# Patient Record
Sex: Male | Born: 1960 | Race: White | Hispanic: No | Marital: Married | Smoking: Never smoker
Health system: Southern US, Community
[De-identification: ages and names within clinical notes are randomized; demographics above are authoritative.]

## PROBLEM LIST (undated history)

## (undated) DIAGNOSIS — J45909 Unspecified asthma, uncomplicated: Secondary | ICD-10-CM

---

## 2013-12-06 ENCOUNTER — Emergency Department (HOSPITAL_COMMUNITY): Payer: Worker's Compensation

## 2013-12-06 ENCOUNTER — Encounter (HOSPITAL_COMMUNITY): Payer: Self-pay | Admitting: Emergency Medicine

## 2013-12-06 ENCOUNTER — Other Ambulatory Visit: Payer: Self-pay

## 2013-12-06 ENCOUNTER — Inpatient Hospital Stay (HOSPITAL_COMMUNITY)
Admission: EM | Admit: 2013-12-06 | Discharge: 2013-12-09 | DRG: 086 | Disposition: A | Discharge status: Rehabilitation Facility | Payer: Worker's Compensation | Attending: Plastic Surgery | Admitting: Plastic Surgery

## 2013-12-06 DIAGNOSIS — S32048A Other fracture of fourth lumbar vertebra, initial encounter for closed fracture: Secondary | ICD-10-CM | POA: Diagnosis present

## 2013-12-06 DIAGNOSIS — S32008A Other fracture of unspecified lumbar vertebra, initial encounter for closed fracture: Secondary | ICD-10-CM | POA: Diagnosis present

## 2013-12-06 DIAGNOSIS — S01311A Laceration without foreign body of right ear, initial encounter: Secondary | ICD-10-CM | POA: Diagnosis present

## 2013-12-06 DIAGNOSIS — H532 Diplopia: Secondary | ICD-10-CM | POA: Diagnosis present

## 2013-12-06 DIAGNOSIS — S32028A Other fracture of second lumbar vertebra, initial encounter for closed fracture: Secondary | ICD-10-CM | POA: Diagnosis present

## 2013-12-06 DIAGNOSIS — S060X9A Concussion with loss of consciousness of unspecified duration, initial encounter: Secondary | ICD-10-CM | POA: Diagnosis present

## 2013-12-06 DIAGNOSIS — S01112A Laceration without foreign body of left eyelid and periocular area, initial encounter: Secondary | ICD-10-CM | POA: Diagnosis present

## 2013-12-06 DIAGNOSIS — S060XAA Concussion with loss of consciousness status unknown, initial encounter: Secondary | ICD-10-CM | POA: Diagnosis present

## 2013-12-06 DIAGNOSIS — R29810 Facial weakness: Secondary | ICD-10-CM | POA: Diagnosis present

## 2013-12-06 DIAGNOSIS — S32018A Other fracture of first lumbar vertebra, initial encounter for closed fracture: Secondary | ICD-10-CM | POA: Diagnosis present

## 2013-12-06 DIAGNOSIS — S32009A Unspecified fracture of unspecified lumbar vertebra, initial encounter for closed fracture: Secondary | ICD-10-CM | POA: Diagnosis present

## 2013-12-06 DIAGNOSIS — S32038A Other fracture of third lumbar vertebra, initial encounter for closed fracture: Secondary | ICD-10-CM | POA: Diagnosis present

## 2013-12-06 DIAGNOSIS — S0219XA Other fracture of base of skull, initial encounter for closed fracture: Secondary | ICD-10-CM | POA: Diagnosis present

## 2013-12-06 DIAGNOSIS — G510 Bell's palsy: Secondary | ICD-10-CM | POA: Diagnosis present

## 2013-12-06 DIAGNOSIS — T1490XA Injury, unspecified, initial encounter: Secondary | ICD-10-CM

## 2013-12-06 DIAGNOSIS — Y9269 Other specified industrial and construction area as the place of occurrence of the external cause: Secondary | ICD-10-CM | POA: Diagnosis not present

## 2013-12-06 DIAGNOSIS — G9389 Other specified disorders of brain: Secondary | ICD-10-CM

## 2013-12-06 DIAGNOSIS — S02109A Fracture of base of skull, unspecified side, initial encounter for closed fracture: Secondary | ICD-10-CM | POA: Diagnosis present

## 2013-12-06 DIAGNOSIS — H9191 Unspecified hearing loss, right ear: Secondary | ICD-10-CM | POA: Diagnosis present

## 2013-12-06 DIAGNOSIS — H7420 Discontinuity and dislocation of ear ossicles, unspecified ear: Secondary | ICD-10-CM | POA: Diagnosis present

## 2013-12-06 DIAGNOSIS — G529 Cranial nerve disorder, unspecified: Secondary | ICD-10-CM | POA: Diagnosis present

## 2013-12-06 DIAGNOSIS — S0181XA Laceration without foreign body of other part of head, initial encounter: Secondary | ICD-10-CM | POA: Diagnosis present

## 2013-12-06 HISTORY — DX: Unspecified asthma, uncomplicated: J45.909

## 2013-12-06 LAB — COMPREHENSIVE METABOLIC PANEL
ALT: 28 U/L (ref 0–53)
AST: 28 U/L (ref 0–37)
Albumin: 4 g/dL (ref 3.5–5.2)
Alkaline Phosphatase: 78 U/L (ref 39–117)
Anion gap: 17 — ABNORMAL HIGH (ref 5–15)
BILIRUBIN TOTAL: 0.3 mg/dL (ref 0.3–1.2)
BUN: 15 mg/dL (ref 6–23)
CALCIUM: 8.7 mg/dL (ref 8.4–10.5)
CHLORIDE: 100 meq/L (ref 96–112)
CO2: 24 meq/L (ref 19–32)
CREATININE: 0.96 mg/dL (ref 0.50–1.35)
GLUCOSE: 125 mg/dL — AB (ref 70–99)
Potassium: 3.3 mEq/L — ABNORMAL LOW (ref 3.7–5.3)
Sodium: 141 mEq/L (ref 137–147)
Total Protein: 7.1 g/dL (ref 6.0–8.3)

## 2013-12-06 LAB — CBC WITH DIFFERENTIAL/PLATELET
BASOS ABS: 0 10*3/uL (ref 0.0–0.1)
BASOS PCT: 0 % (ref 0–1)
EOS PCT: 4 % (ref 0–5)
Eosinophils Absolute: 0.6 10*3/uL (ref 0.0–0.7)
HCT: 42 % (ref 39.0–52.0)
Hemoglobin: 14 g/dL (ref 13.0–17.0)
Lymphocytes Relative: 19 % (ref 12–46)
Lymphs Abs: 3 10*3/uL (ref 0.7–4.0)
MCH: 31.1 pg (ref 26.0–34.0)
MCHC: 33.3 g/dL (ref 30.0–36.0)
MCV: 93.3 fL (ref 78.0–100.0)
MONOS PCT: 7 % (ref 3–12)
Monocytes Absolute: 1.2 10*3/uL — ABNORMAL HIGH (ref 0.1–1.0)
NEUTROS ABS: 10.9 10*3/uL — AB (ref 1.7–7.7)
Neutrophils Relative %: 70 % (ref 43–77)
Platelets: 236 10*3/uL (ref 150–400)
RBC: 4.5 MIL/uL (ref 4.22–5.81)
RDW: 12.7 % (ref 11.5–15.5)
WBC: 15.7 10*3/uL — ABNORMAL HIGH (ref 4.0–10.5)

## 2013-12-06 LAB — MRSA PCR SCREENING: MRSA by PCR: NEGATIVE

## 2013-12-06 LAB — CDS SEROLOGY

## 2013-12-06 LAB — TYPE AND SCREEN
ABO/RH(D): A POS
Antibody Screen: NEGATIVE

## 2013-12-06 LAB — PROTIME-INR
INR: 1.14 (ref 0.00–1.49)
Prothrombin Time: 14.8 seconds (ref 11.6–15.2)

## 2013-12-06 LAB — ABO/RH: ABO/RH(D): A POS

## 2013-12-06 LAB — ETHANOL: Alcohol, Ethyl (B): <11 mg/dL (ref 0–11)

## 2013-12-06 MED ORDER — MORPHINE SULFATE 2 MG/ML IJ SOLN
2.0000 mg | INTRAMUSCULAR | Status: DC | PRN
  Administered 2013-12-06 – 2013-12-07 (×3): 2 mg via INTRAVENOUS
  Filled 2013-12-06 (×6): qty 1

## 2013-12-06 MED ORDER — HYPROMELLOSE (GONIOSCOPIC) 2.5 % OP SOLN: 1.0000 [drp] | OPHTHALMIC | Status: DC

## 2013-12-06 MED ORDER — ONDANSETRON HCL 4 MG/2ML IJ SOLN
4.0000 mg | Freq: Four times a day (QID) | INTRAMUSCULAR | Status: DC | PRN
  Administered 2013-12-06 – 2013-12-08 (×5): 4 mg via INTRAVENOUS
  Filled 2013-12-06 (×6): qty 2

## 2013-12-06 MED ORDER — ONDANSETRON HCL 4 MG/2ML IJ SOLN
4.0000 mg | Freq: Once | INTRAMUSCULAR | Status: AC
  Administered 2013-12-06: 4 mg via INTRAVENOUS
  Filled 2013-12-06: qty 2

## 2013-12-06 MED ORDER — IOHEXOL 350 MG/ML SOLN
50.0000 mL | Freq: Once | INTRAVENOUS | Status: AC | PRN
  Administered 2013-12-06: 50 mL via INTRAVENOUS

## 2013-12-06 MED ORDER — POLYVINYL ALCOHOL 1.4 % OP SOLN
1.0000 [drp] | OPHTHALMIC | Status: DC | PRN
  Filled 2013-12-06: qty 15

## 2013-12-06 MED ORDER — ONDANSETRON HCL 4 MG PO TABS: 4.0000 mg | ORAL_TABLET | Freq: Four times a day (QID) | ORAL | Status: DC | PRN

## 2013-12-06 MED ORDER — PANTOPRAZOLE SODIUM 40 MG IV SOLR
40.0000 mg | Freq: Every day | INTRAVENOUS | Status: DC
  Administered 2013-12-06 – 2013-12-07 (×2): 40 mg via INTRAVENOUS
  Filled 2013-12-06 (×4): qty 40

## 2013-12-06 MED ORDER — SODIUM CHLORIDE 0.9 % IV BOLUS (SEPSIS)
1000.0000 mL | Freq: Once | INTRAVENOUS | Status: AC
  Administered 2013-12-06: 1000 mL via INTRAVENOUS

## 2013-12-06 MED ORDER — TETANUS-DIPHTH-ACELL PERTUSSIS 5-2.5-18.5 LF-MCG/0.5 IM SUSP
0.5000 mL | Freq: Once | INTRAMUSCULAR | Status: AC
  Administered 2013-12-06: 0.5 mL via INTRAMUSCULAR
  Filled 2013-12-06: qty 0.5

## 2013-12-06 MED ORDER — ARTIFICIAL TEARS OP OINT
1.0000 "application " | TOPICAL_OINTMENT | Freq: Every day | OPHTHALMIC | Status: DC
  Administered 2013-12-06 – 2013-12-08 (×3): 1 via OPHTHALMIC
  Filled 2013-12-06: qty 3.5

## 2013-12-06 MED ORDER — POTASSIUM CHLORIDE IN NACL 20-0.9 MEQ/L-% IV SOLN
INTRAVENOUS | Status: DC
  Administered 2013-12-06 – 2013-12-08 (×3): via INTRAVENOUS
  Filled 2013-12-06 (×6): qty 1000

## 2013-12-06 MED ORDER — PANTOPRAZOLE SODIUM 40 MG PO TBEC
40.0000 mg | DELAYED_RELEASE_TABLET | Freq: Every day | ORAL | Status: DC
  Administered 2013-12-08 – 2013-12-09 (×2): 40 mg via ORAL
  Filled 2013-12-06 (×2): qty 1

## 2013-12-06 MED ORDER — CETYLPYRIDINIUM CHLORIDE 0.05 % MT LIQD
7.0000 mL | Freq: Two times a day (BID) | OROMUCOSAL | Status: DC
  Administered 2013-12-06 – 2013-12-08 (×4): 7 mL via OROMUCOSAL

## 2013-12-06 MED ORDER — LIDOCAINE-EPINEPHRINE 2 %-1:100000 IJ SOLN
20.0000 mL | Freq: Once | INTRAMUSCULAR | Status: AC
  Administered 2013-12-06: 20 mL
  Filled 2013-12-06: qty 20

## 2013-12-06 MED ORDER — HYDROMORPHONE HCL 1 MG/ML IJ SOLN
1.0000 mg | Freq: Once | INTRAMUSCULAR | Status: AC
  Administered 2013-12-06: 1 mg via INTRAVENOUS
  Filled 2013-12-06: qty 1

## 2013-12-06 NOTE — Op Note (Signed)
Operative Note   DATE OF OPERATION: 12/06/2013  [facility]  SURGICAL DIVISION: Plastic Surgery  PREOPERATIVE DIAGNOSES:  Facial trauma with complex right ear laceration and left upper eye lid laceration.  POSTOPERATIVE DIAGNOSES:  same  PROCEDURE:  Repair of complex right ear laceration (8 cm) closed in layers and included cartilage and left upper eye lid laceration complex 3 cm.  SURGEON: Wayland Denislaire Sanger, DO  ANESTHESIA:  local  COMPLICATIONS: None.   INDICATIONS FOR PROCEDURE:  The patient, Michael Hooper is a 53 y.o. male born on [birth year], is here for treatment of multiple facial lacerations. MRN: [id number]  CONSENT:  Informed consent was obtained directly from the patient. Risks, benefits and alternatives were fully discussed. Specific risks including but not limited to bleeding, infection, hematoma, seroma, scarring, pain, infection, contracture, asymmetry, wound healing problems, and need for further surgery were all discussed. The patient did have an ample opportunity to have questions answered to satisfaction.   DESCRIPTION OF PROCEDURE:  The patient's operative site was prepped and draped in a sterile fashion. A time out was performed and all information was confirmed to be correct.  Local anesthesia was administered.  The right ear was cleaned with betadine and a periauricular block was performed for pain management.  The area was irrigated and no foreign bodies were identified.  The 5-0 Monocryl was used to close the skin with interrupted and running stitches. The posterior ear helix area was then closed and then a few sutures placed in the cartilage of the helix.  The anterior portion of the skin was then closed with the 5-0 Monocryl (total 8 cm).  A xeroform bolster was placed.  The left upper eyelid was then prepped.  Local was placed and the area cleaned.  The 5-0 Monocryl was used to re-approximate the skin (3 cm). The nonviable skin was excised. The patient  tolerated the procedure well.  There were no complications. The patient was allowed to wake from anesthesia, extubated and taken to the recovery room in satisfactory condition.

## 2013-12-06 NOTE — ED Notes (Signed)
Pt noted to have limited rt eye movement.

## 2013-12-06 NOTE — ED Notes (Addendum)
Dr. Jule Sernudleman at bedside

## 2013-12-06 NOTE — Consult Note (Signed)
Reason for Consult:  Head injury with basilar skull fracture Referring Physician:  Dr. Georganna Skeans  Michael Hooper is an 53 y.o.right-handed white male.  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 He apparently suffered a brief loss of consciousness, and was subsequent transferred to the [facility] emergency room, where he underwent evaluation by the emergency room physician, and Dr. Georganna Skeans from trauma surgery. Patient is found to have suffered a multiple trauma including a basilar skull fracture, with a small amount of pneumocephalus, a severe right ear laceration and a lesser left eyelid laceration.  Patient is being admitted to the trauma surgical service by Dr. Grandville Silos who is requested neurosurgical consultation. Patient is being seen in maxillofacial surgery consultation by Dr. Theodoro Kos.  Patient denies any history of hypertension, myocardial infarction, stroke, lung disease, diabetes, peptic ulcer disease, or cancer.  Past Medical History:  Past Medical History  Diagnosis Date  . Asthma     Past Surgical History: History reviewed. No pertinent past surgical history.  Family History:  Family History  Problem Relation Age of Onset  . Heart failure Mother   . Heart failure Father   . Asthma Brother     Social History:  reports that he has never smoked. He does not have any smokeless tobacco history on file. He reports that he drinks about 2.4 oz of alcohol per week. He reports that he does not use illicit drugs.  Allergies: No Known Allergies  Medications: I have reviewed the patient's current medications.  ROS:  Notable for decreased hearing in the right ear.  Physical Examination: Patient was seen and examined while Dr. Migdalia Dk was preparing the right ear for plastic surgical repair of a severe laceration. Patient well-developed well-nourished white male in no acute distress.  Blood pressure 118/72, pulse 76, temperature 97.4 F  (36.3 C), temperature source Axillary, resp. rate 14, SpO2 96 %.  Neurological Examination: Mental Status Examination:  Awake and alert, oriented. Following commands.  Speech fluent, but dysarthric. Cranial Nerve Examination:  Pupils 3.5 mm bilaterally, round, reactive to light. Patient unable to laterally deviate right eye from midline, remainder of X ocular movement function intact, indicative of right sixth nerve palsy. Facial sensation intact. Right peripheral facial palsy. Loss of hearing in right ear,good hearing in left ear. Palatal movement symmetrical, shoulder shrug symmetrical, tongue midline. Motor Examination:  5/5 strength in the upper and lower extremities. No drift of the upper extremities. Sensory Examination:  Intact to pinprick in the distal extremities. Reflex Examination:   Symmetrical. Gait and Stance Examination:  Not tested due to the nature the patient's condition.   Results for orders placed or performed during the hospital encounter of 12/06/13 (from the past 48 hour(s))  Comprehensive metabolic panel     Status: Abnormal   Collection Time: 12/06/13  2:08 PM  Result Value Ref Range   Sodium 141 137 - 147 mEq/L   Potassium 3.3 (L) 3.7 - 5.3 mEq/L   Chloride 100 96 - 112 mEq/L   CO2 24 19 - 32 mEq/L   Glucose, Bld 125 (H) 70 - 99 mg/dL   BUN 15 6 - 23 mg/dL   Creatinine, Ser 0.96 0.50 - 1.35 mg/dL   Calcium 8.7 8.4 - 10.5 mg/dL   Total Protein 7.1 6.0 - 8.3 g/dL   Albumin 4.0 3.5 - 5.2 g/dL   AST 28 0 - 37 U/L    Comment: HEMOLYSIS AT THIS LEVEL MAY AFFECT  RESULT   ALT 28 0 - 53 U/L   Alkaline Phosphatase 78 39 - 117 U/L   Total Bilirubin 0.3 0.3 - 1.2 mg/dL   GFR calc non Af Amer >90 >90 mL/min   GFR calc Af Amer >90 >90 mL/min    Comment: (NOTE) The eGFR has been calculated using the CKD EPI equation. This calculation has not been validated in all clinical situations. eGFR's persistently <90 mL/min signify possible Chronic Kidney Disease.    Anion gap  17 (H) 5 - 15  Protime-INR     Status: None   Collection Time: 12/06/13  2:08 PM  Result Value Ref Range   Prothrombin Time 14.8 11.6 - 15.2 seconds   INR 1.14 0.00 - 1.49  CBC WITH DIFFERENTIAL     Status: Abnormal   Collection Time: 12/06/13  2:08 PM  Result Value Ref Range   WBC 15.7 (H) 4.0 - 10.5 K/uL   RBC 4.50 4.22 - 5.81 MIL/uL   Hemoglobin 14.0 13.0 - 17.0 g/dL   HCT 42.0 39.0 - 52.0 %   MCV 93.3 78.0 - 100.0 fL   MCH 31.1 26.0 - 34.0 pg   MCHC 33.3 30.0 - 36.0 g/dL   RDW 12.7 11.5 - 15.5 %   Platelets 236 150 - 400 K/uL   Neutrophils Relative % 70 43 - 77 %   Neutro Abs 10.9 (H) 1.7 - 7.7 K/uL   Lymphocytes Relative 19 12 - 46 %   Lymphs Abs 3.0 0.7 - 4.0 K/uL   Monocytes Relative 7 3 - 12 %   Monocytes Absolute 1.2 (H) 0.1 - 1.0 K/uL   Eosinophils Relative 4 0 - 5 %   Eosinophils Absolute 0.6 0.0 - 0.7 K/uL   Basophils Relative 0 0 - 1 %   Basophils Absolute 0.0 0.0 - 0.1 K/uL  Ethanol     Status: None   Collection Time: 12/06/13  2:08 PM  Result Value Ref Range   Alcohol, Ethyl (B) <11 0 - 11 mg/dL    Comment:        LOWEST DETECTABLE LIMIT FOR SERUM ALCOHOL IS 11 mg/dL FOR MEDICAL PURPOSES ONLY   Type and screen     Status: None   Collection Time: 12/06/13  2:08 PM  Result Value Ref Range   ABO/RH(D) A POS    Antibody Screen NEG    Sample Expiration 12/09/2013   ABO/Rh     Status: None   Collection Time: 12/06/13  2:08 PM  Result Value Ref Range   ABO/RH(D) A POS     Ct Head Wo Contrast  12/06/2013   CLINICAL DATA:  Patient was pinned between a forklift and rack at work, difficult hearing in both ears, lacerations to LEFT face and LEFT eye  EXAM: CT HEAD WITHOUT CONTRAST  CT MAXILLOFACIAL WITHOUT CONTRAST  CT CERVICAL SPINE WITHOUT CONTRAST  TECHNIQUE: Multidetector CT imaging of the head, cervical spine, and maxillofacial structures were performed using the standard protocol without intravenous contrast. Multiplanar CT image reconstructions of the  cervical spine and maxillofacial structures were also generated. RIGHT-side of face marked with a BB.  COMPARISON:  None.  FINDINGS: CT HEAD FINDINGS  Normal ventricular morphology.  No midline shift or mass effect.  Normal appearance of brain parenchyma.  No intracranial hemorrhage, mass lesion or evidence acute infarction.  Air present at the cavernous sinuses bilaterally, in the sella turcica, and suprasellar compatible with basilar skull fracture.  Partially opacified mastoid air cells bilaterally.  Air-fluid levels in RIGHT maxillary and sphenoid sinuses with partial ethmoid opacification bilaterally.  No calvarial fracture identified.  CT MAXILLOFACIAL FINDINGS  Air present within sella turcica, cavernous sinuses and suprasellar region.  Air-fluid level/hemorrhage within RIGHT sphenoid sinus and RIGHT maxillary sinus.  Partial opacification of ethmoid air cells bilaterally.  Small mucosal retention cyst LEFT maxillary sinus.  Frontal sinus and LEFT maxillary sinus otherwise clear.  Partial opacification of mastoid air cells bilaterally.  Alignment of the LEFT ossicles grossly normal.  Disruption of RIGHT ossicular chain with displacement of the malleus.  RIGHT temporal bone fracture extends to the RIGHT sphenoid across the lateral and anterior walls of the RIGHT sphenoid sinus.  No definite fractures of the orbits, maxillary sinuses or zygomas.  Soft tissue gas identified anterior and inferior to the mastoid air cells bilaterally and tracking inferiorly into the parapharyngeal spaces.  Gas is seen near the courses of the internal carotid arteries bilaterally at the carotid siphons but no discrete fracture planes involving the siphons are visualized.  Intraorbital soft tissue planes clear.  CT CERVICAL SPINE FINDINGS  Partial BILATERAL mastoid air cell opacification.  Prevertebral soft tissues normal thickness.  Minimal scattered disc space narrowing and endplate spur formation.  Vertebral body heights  maintained.  No acute cervical spine fracture, subluxation, or bone destruction.  Lung apices clear.  IMPRESSION: No acute intracranial abnormalities.  Degenerative disc disease changes of the cervical spine.  No acute cervical spine abnormalities.  BILATERAL temporal bone fractures with partial opacification of the mastoid air cells bilaterally.  Disruption of the RIGHT ossicular chain.  Extension of RIGHT temporal fracture into the RIGHT sphenoid bone and traversing the RIGHT sphenoid sinus.  Though no definite involvement of the carotid canals is seen bilaterally, fracture planes particularly on the RIGHT are in close proximity to the carotid canals and if there is clinical concern for internal carotid injury or presence of neurologic impairment then consider CTA head to assess the internal carotid arteries at the skullbase.  Associated soft tissue gas caudal to the skullbase as well as intracranial gas at the cavernous sinuses, sella turcica and suprasellar regions.  Findings called to Dr. Jacelyn Grip on 12/06/2013 at 1650 hr.   Electronically Signed   By: Lavonia Dana M.D.   On: 12/06/2013 16:52   Ct Chest W Contrast  12/06/2013   CLINICAL DATA:  Trauma. Tenth between 4 clip in and around back. Severe right lower quadrant pain. Right arm limited range of motion.  EXAM: CT CHEST, ABDOMEN, AND PELVIS WITH CONTRAST  TECHNIQUE: Multidetector CT imaging of the chest, abdomen and pelvis was performed following the standard protocol during bolus administration of intravenous contrast.  CONTRAST:  90 cc Omnipaque 300 intravenous  COMPARISON:  None.  FINDINGS: CT CHEST FINDINGS  THORACIC INLET/BODY WALL:  No acute abnormality.  MEDIASTINUM:  Normal heart size. No pericardial effusion. No acute vascular abnormality. No adenopathy.  LUNG WINDOWS:  No contusion, hemothorax, or pneumothorax. Mild scattered atelectasis. Incidental air trapping in the apical left upper lobe; noted history of asthma.  OSSEOUS:  See below  CT  ABDOMEN AND PELVIS FINDINGS  BODY WALL: Subcutaneous reticulation in the right lower lumbar region consistent with contusion.  Liver: No focal abnormality.  Biliary: No evidence of biliary obstruction or stone.  Pancreas: Unremarkable.  Spleen: Unremarkable.  Adrenals: Unremarkable.  Kidneys and ureters: No evidence of injury.  Bladder: Unremarkable.  Reproductive: Unremarkable.  Bowel: No evidence of injury. Extensive distal colonic diverticulosis.  Retroperitoneum: No mass  or adenopathy.  Peritoneum: No free fluid or gas.  Vascular: No acute findings.  OSSEOUS: No findings to explain right shoulder symptoms. Note that the upper scapula, humerus, and distal clavicle is not visualized.  L1 through L4 right transverse process fractures with up to moderate displacement/distraction. There is associated hemorrhage within the right quadratus lumborum and neighboring deep back muscles. No measurable hematoma or evidence of active hemorrhage.  IMPRESSION: 1. L1 through L4 right transverse process fractures. 2. No acute intrathoracic or intra-abdominal findings. 3. Extensive colonic diverticulosis.   Electronically Signed   By: Jorje Guild M.D.   On: 12/06/2013 16:04   Ct Cervical Spine Wo Contrast  12/06/2013   CLINICAL DATA:  Patient was pinned between a forklift and rack at work, difficult hearing in both ears, lacerations to LEFT face and LEFT eye  EXAM: CT HEAD WITHOUT CONTRAST  CT MAXILLOFACIAL WITHOUT CONTRAST  CT CERVICAL SPINE WITHOUT CONTRAST  TECHNIQUE: Multidetector CT imaging of the head, cervical spine, and maxillofacial structures were performed using the standard protocol without intravenous contrast. Multiplanar CT image reconstructions of the cervical spine and maxillofacial structures were also generated. RIGHT-side of face marked with a BB.  COMPARISON:  None.  FINDINGS: CT HEAD FINDINGS  Normal ventricular morphology.  No midline shift or mass effect.  Normal appearance of brain parenchyma.  No  intracranial hemorrhage, mass lesion or evidence acute infarction.  Air present at the cavernous sinuses bilaterally, in the sella turcica, and suprasellar compatible with basilar skull fracture.  Partially opacified mastoid air cells bilaterally.  Air-fluid levels in RIGHT maxillary and sphenoid sinuses with partial ethmoid opacification bilaterally.  No calvarial fracture identified.  CT MAXILLOFACIAL FINDINGS  Air present within sella turcica, cavernous sinuses and suprasellar region.  Air-fluid level/hemorrhage within RIGHT sphenoid sinus and RIGHT maxillary sinus.  Partial opacification of ethmoid air cells bilaterally.  Small mucosal retention cyst LEFT maxillary sinus.  Frontal sinus and LEFT maxillary sinus otherwise clear.  Partial opacification of mastoid air cells bilaterally.  Alignment of the LEFT ossicles grossly normal.  Disruption of RIGHT ossicular chain with displacement of the malleus.  RIGHT temporal bone fracture extends to the RIGHT sphenoid across the lateral and anterior walls of the RIGHT sphenoid sinus.  No definite fractures of the orbits, maxillary sinuses or zygomas.  Soft tissue gas identified anterior and inferior to the mastoid air cells bilaterally and tracking inferiorly into the parapharyngeal spaces.  Gas is seen near the courses of the internal carotid arteries bilaterally at the carotid siphons but no discrete fracture planes involving the siphons are visualized.  Intraorbital soft tissue planes clear.  CT CERVICAL SPINE FINDINGS  Partial BILATERAL mastoid air cell opacification.  Prevertebral soft tissues normal thickness.  Minimal scattered disc space narrowing and endplate spur formation.  Vertebral body heights maintained.  No acute cervical spine fracture, subluxation, or bone destruction.  Lung apices clear.  IMPRESSION: No acute intracranial abnormalities.  Degenerative disc disease changes of the cervical spine.  No acute cervical spine abnormalities.  BILATERAL temporal  bone fractures with partial opacification of the mastoid air cells bilaterally.  Disruption of the RIGHT ossicular chain.  Extension of RIGHT temporal fracture into the RIGHT sphenoid bone and traversing the RIGHT sphenoid sinus.  Though no definite involvement of the carotid canals is seen bilaterally, fracture planes particularly on the RIGHT are in close proximity to the carotid canals and if there is clinical concern for internal carotid injury or presence of neurologic impairment then consider CTA  head to assess the internal carotid arteries at the skullbase.  Associated soft tissue gas caudal to the skullbase as well as intracranial gas at the cavernous sinuses, sella turcica and suprasellar regions.  Findings called to Dr. Jacelyn Grip on 12/06/2013 at 1650 hr.   Electronically Signed   By: Lavonia Dana M.D.   On: 12/06/2013 16:52   Ct Abdomen Pelvis W Contrast  12/06/2013   CLINICAL DATA:  Trauma. Tenth between 4 clip in and around back. Severe right lower quadrant pain. Right arm limited range of motion.  EXAM: CT CHEST, ABDOMEN, AND PELVIS WITH CONTRAST  TECHNIQUE: Multidetector CT imaging of the chest, abdomen and pelvis was performed following the standard protocol during bolus administration of intravenous contrast.  CONTRAST:  90 cc Omnipaque 300 intravenous  COMPARISON:  None.  FINDINGS: CT CHEST FINDINGS  THORACIC INLET/BODY WALL:  No acute abnormality.  MEDIASTINUM:  Normal heart size. No pericardial effusion. No acute vascular abnormality. No adenopathy.  LUNG WINDOWS:  No contusion, hemothorax, or pneumothorax. Mild scattered atelectasis. Incidental air trapping in the apical left upper lobe; noted history of asthma.  OSSEOUS:  See below  CT ABDOMEN AND PELVIS FINDINGS  BODY WALL: Subcutaneous reticulation in the right lower lumbar region consistent with contusion.  Liver: No focal abnormality.  Biliary: No evidence of biliary obstruction or stone.  Pancreas: Unremarkable.  Spleen: Unremarkable.   Adrenals: Unremarkable.  Kidneys and ureters: No evidence of injury.  Bladder: Unremarkable.  Reproductive: Unremarkable.  Bowel: No evidence of injury. Extensive distal colonic diverticulosis.  Retroperitoneum: No mass or adenopathy.  Peritoneum: No free fluid or gas.  Vascular: No acute findings.  OSSEOUS: No findings to explain right shoulder symptoms. Note that the upper scapula, humerus, and distal clavicle is not visualized.  L1 through L4 right transverse process fractures with up to moderate displacement/distraction. There is associated hemorrhage within the right quadratus lumborum and neighboring deep back muscles. No measurable hematoma or evidence of active hemorrhage.  IMPRESSION: 1. L1 through L4 right transverse process fractures. 2. No acute intrathoracic or intra-abdominal findings. 3. Extensive colonic diverticulosis.   Electronically Signed   By: Jorje Guild M.D.   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 Facial trauma.  EXAM: PORTABLE PELVIS 1-2 VIEWS  COMPARISON:  None.  FINDINGS: There is no evidence of pelvic fracture or dislocation. There are mild degenerative joint changes with narrowed bilateral hip joint spaces.  IMPRESSION: No acute fracture or dislocation.   Electronically Signed   By: Abelardo Diesel M.D.   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 Facial trauma Left eyelid, missing tooth top left and Right ear. LOC after intial accident, back at baseline. Back pain.  EXAM: PORTABLE CHEST - 1 VIEW  COMPARISON:  None.  FINDINGS: The heart size and mediastinal contours are within normal limits. Both lungs are clear. The visualized skeletal structures are unremarkable.  IMPRESSION: No active disease.   Electronically Signed   By: Kathreen Devoid   On:  12/06/2013 15:00   Ct Maxillofacial Wo Cm  12/06/2013   CLINICAL DATA:  Patient was pinned between a forklift and rack at work, difficult hearing in both ears, lacerations to LEFT face and LEFT eye  EXAM: CT HEAD  WITHOUT CONTRAST  CT MAXILLOFACIAL WITHOUT CONTRAST  CT CERVICAL SPINE WITHOUT CONTRAST  TECHNIQUE: Multidetector CT imaging of the head, cervical spine, and maxillofacial structures were performed using the standard protocol without intravenous contrast. Multiplanar CT image reconstructions of the cervical spine and maxillofacial structures were also generated. RIGHT-side of face marked with a BB.  COMPARISON:  None.  FINDINGS: CT HEAD FINDINGS  Normal ventricular morphology.  No midline shift or mass effect.  Normal appearance of brain parenchyma.  No intracranial hemorrhage, mass lesion or evidence acute infarction.  Air present at the cavernous sinuses bilaterally, in the sella turcica, and suprasellar compatible with basilar skull fracture.  Partially opacified mastoid air cells bilaterally.  Air-fluid levels in RIGHT maxillary and sphenoid sinuses with partial ethmoid opacification bilaterally.  No calvarial fracture identified.  CT MAXILLOFACIAL FINDINGS  Air present within sella turcica, cavernous sinuses and suprasellar region.  Air-fluid level/hemorrhage within RIGHT sphenoid sinus and RIGHT maxillary sinus.  Partial opacification of ethmoid air cells bilaterally.  Small mucosal retention cyst LEFT maxillary sinus.  Frontal sinus and LEFT maxillary sinus otherwise clear.  Partial opacification of mastoid air cells bilaterally.  Alignment of the LEFT ossicles grossly normal.  Disruption of RIGHT ossicular chain with displacement of the malleus.  RIGHT temporal bone fracture extends to the RIGHT sphenoid across the lateral and anterior walls of the RIGHT sphenoid sinus.  No definite fractures of the orbits, maxillary sinuses or zygomas.  Soft tissue gas identified anterior and inferior to the  mastoid air cells bilaterally and tracking inferiorly into the parapharyngeal spaces.  Gas is seen near the courses of the internal carotid arteries bilaterally at the carotid siphons but no discrete fracture planes involving the siphons are visualized.  Intraorbital soft tissue planes clear.  CT CERVICAL SPINE FINDINGS  Partial BILATERAL mastoid air cell opacification.  Prevertebral soft tissues normal thickness.  Minimal scattered disc space narrowing and endplate spur formation.  Vertebral body heights maintained.  No acute cervical spine fracture, subluxation, or bone destruction.  Lung apices clear.  IMPRESSION: No acute intracranial abnormalities.  Degenerative disc disease changes of the cervical spine.  No acute cervical spine abnormalities.  BILATERAL temporal bone fractures with partial opacification of the mastoid air cells bilaterally.  Disruption of the RIGHT ossicular chain.  Extension of RIGHT temporal fracture into the RIGHT sphenoid bone and traversing the RIGHT sphenoid sinus.  Though no definite involvement of the carotid canals is seen bilaterally, fracture planes particularly on the RIGHT are in close proximity to the carotid canals and if there is clinical concern for internal carotid injury or presence of neurologic impairment then consider CTA head to assess the internal carotid arteries at the skullbase.  Associated soft tissue gas caudal to the skullbase as well as intracranial gas at the cavernous sinuses, sella turcica and suprasellar regions.  Findings called to Dr. Jacelyn Grip on 12/06/2013 at 1650 hr.   Electronically Signed   By: Lavonia Dana M.D.   On: 12/06/2013 16:52     Assessment/Plan: Patient with multiple trauma from an injury at work.  Patient with evidence of a basilar skull fracture with a small amount of pneumocephalus. Neurologic examination is notable for right VI nerve palsy, right VII peripheral nerve palsy and loss of hearing in the right ear.  Spoke with Dr. Grandville Silos  about neurologic findings. I feel the patient needs ENT evaluation of the right seventh peripheral nerve palsy and loss of hearing in the right ear; he indicated that he will consult  them.  I spoke with the patient's wife and son at the bedside, and explained the CT findings of the basilar skull fracture, and the neurologic findings of the right eye extraocular movement dysfunction, right facial weakness, and loss of hearing in the right ear, and the uncertainty of recovery of these functions.  We also discussed the risk of meningitis and cerebrospinal fluid leakage possibly requiring surgical intervention, as regards to the potential complications of the basilar skull fracture. I will continue to follow patient along with the trauma surgical service, and the other consulting specialists.    Hosie Spangle, MD 12/06/2013, 6:03 PM

## 2013-12-06 NOTE — ED Notes (Signed)
Pt transported to CT with this RN to monitor. Pt tolerating well.

## 2013-12-06 NOTE — ED Notes (Signed)
With pt in CT scan

## 2013-12-06 NOTE — ED Provider Notes (Signed)
CSN: [id number]     Arrival date & time 12/06/13  1400 History   First MD Initiated Contact with Patient 12/06/13 1406     Chief Complaint  Patient presents with  . Facial Injury  . Ear Laceration     (Consider location/radiation/quality/duration/timing/severity/associated sxs/prior Treatment) Patient is a 53 y.o. male presenting with trauma. The history is provided by the patient. No language interpreter was used.  Trauma Mechanism of injury: crush injury Injury location: head/neck and face Injury location detail: R ear and face and L eyelid Incident location: at work Arrived directly from scene: yes   Protective equipment:       None      Suspicion of alcohol use: no      Suspicion of drug use: no  EMS/PTA data:      Bystander interventions: bystander C-spine precautions      Ambulatory at scene: yes      Blood loss: moderate      Responsiveness: alert      Oriented to: person, place, situation and time      Loss of consciousness: yes      Amnesic to event: no      Airway interventions: none  Current symptoms:      Pain scale: 10/10      Pain quality: sharp      Pain timing: constant      Associated symptoms:            Reports loss of consciousness.            Denies abdominal pain, blindness, chest pain, difficulty breathing, headache, nausea and vomiting.   Relevant PMH:      Medical risk factors:            No asthma, COPD, CAD, CHF, past MI, CABG, cardiac stents, AICD, pacemaker, hemophilia, diabetes, kidney disease, dialysis or pregnancy.       Pharmacological risk factors:            No anticoagulation therapy, antiplatelet therapy, beta blocker therapy or steroid therapy.       Tetanus status: out of date   No past medical history on file. No past surgical history on file. No family history on file. History  Substance Use Topics  . Smoking status: Not on file  . Smokeless tobacco: Not on file  . Alcohol Use: Not on file    Review of Systems   Constitutional: Negative for fever.  HENT: Negative for congestion, rhinorrhea and sore throat.   Eyes: Negative for blindness.  Respiratory: Negative for cough and shortness of breath.   Cardiovascular: Negative for chest pain.  Gastrointestinal: Negative for nausea, vomiting, abdominal pain and diarrhea.  Genitourinary: Negative for dysuria and hematuria.  Skin: Positive for wound. Negative for rash.  Neurological: Positive for loss of consciousness. Negative for syncope, light-headedness and headaches.  All other systems reviewed and are negative.     Allergies  Review of patient's allergies indicates not on file.  Home Medications   Prior to Admission medications   Not on File   BP 113/75 mmHg  Pulse 73  Temp(Src) 97.4 F (36.3 C) (Axillary)  Resp 12  SpO2 95% Physical Exam  Nursing note and vitals reviewed. General: well nourished, well hydrated, no acute distress Head: no midface instability Face: right cranial nerve 7 palsy, through and through laceration through pinna of ear, right canal with blood, unable to visualize TM. Laceration to mastoid region of right ear.  Eyes: conjunctivae and  lids normal; pupils equal, round, reactive to light, 1.5cm left eyelid Neck: supple, no masses, trachea midline. C-collar: on Spine: No cervical, thoracic, lumbar tenderness. Normal rectal tone.  Right flank tenderness Respiratory: Trachea midline,no intercostal retractions or use of accessory muscles, clear to auscultation bilaterally Cardiovascular: Nml S1, S2, no murmur, rub, or gallop, radial pulses 2+, symmetric Gastrointestinal: Abdomen soft, non-tender, non-distended, no masses, bowel sounds normal. No rectal bleeding.  Extremities: MAEW, FROM, no cyanosis, clubbing or edema Mental Status: judgment, insight intact; oriented to time, place, and person   ED Course  Procedures (including critical care time) Labs Review Labs Reviewed  COMPREHENSIVE METABOLIC PANEL -  Abnormal; Notable for the following:    Potassium 3.3 (*)    Glucose, Bld 125 (*)    Anion gap 17 (*)    All other components within normal limits  CBC WITH DIFFERENTIAL - Abnormal; Notable for the following:    WBC 15.7 (*)    Neutro Abs 10.9 (*)    Monocytes Absolute 1.2 (*)    All other components within normal limits  PROTIME-INR  ETHANOL  DRUG SCREEN, URINE  TYPE AND SCREEN  ABO/RH    Imaging Review Ct Head Wo Contrast  12/06/2013   CLINICAL DATA:  Patient was pinned between a forklift and rack at work, difficult hearing in both ears, lacerations to LEFT face and LEFT eye  EXAM: CT HEAD WITHOUT CONTRAST  CT MAXILLOFACIAL WITHOUT CONTRAST  CT CERVICAL SPINE WITHOUT CONTRAST  TECHNIQUE: Multidetector CT imaging of the head, cervical spine, and maxillofacial structures were performed using the standard protocol without intravenous contrast. Multiplanar CT image reconstructions of the cervical spine and maxillofacial structures were also generated. RIGHT-side of face marked with a BB.  COMPARISON:  None.  FINDINGS: CT HEAD FINDINGS  Normal ventricular morphology.  No midline shift or mass effect.  Normal appearance of brain parenchyma.  No intracranial hemorrhage, mass lesion or evidence acute infarction.  Air present at the cavernous sinuses bilaterally, in the sella turcica, and suprasellar compatible with basilar skull fracture.  Partially opacified mastoid air cells bilaterally.  Air-fluid levels in RIGHT maxillary and sphenoid sinuses with partial ethmoid opacification bilaterally.  No calvarial fracture identified.  CT MAXILLOFACIAL FINDINGS  Air present within sella turcica, cavernous sinuses and suprasellar region.  Air-fluid level/hemorrhage within RIGHT sphenoid sinus and RIGHT maxillary sinus.  Partial opacification of ethmoid air cells bilaterally.  Small mucosal retention cyst LEFT maxillary sinus.  Frontal sinus and LEFT maxillary sinus otherwise clear.  Partial opacification of  mastoid air cells bilaterally.  Alignment of the LEFT ossicles grossly normal.  Disruption of RIGHT ossicular chain with displacement of the malleus.  RIGHT temporal bone fracture extends to the RIGHT sphenoid across the lateral and anterior walls of the RIGHT sphenoid sinus.  No definite fractures of the orbits, maxillary sinuses or zygomas.  Soft tissue gas identified anterior and inferior to the mastoid air cells bilaterally and tracking inferiorly into the parapharyngeal spaces.  Gas is seen near the courses of the internal carotid arteries bilaterally at the carotid siphons but no discrete fracture planes involving the siphons are visualized.  Intraorbital soft tissue planes clear.  CT CERVICAL SPINE FINDINGS  Partial BILATERAL mastoid air cell opacification.  Prevertebral soft tissues normal thickness.  Minimal scattered disc space narrowing and endplate spur formation.  Vertebral body heights maintained.  No acute cervical spine fracture, subluxation, or bone destruction.  Lung apices clear.  IMPRESSION: No acute intracranial abnormalities.  Degenerative disc disease  changes of the cervical spine.  No acute cervical spine abnormalities.  BILATERAL temporal bone fractures with partial opacification of the mastoid air cells bilaterally.  Disruption of the RIGHT ossicular chain.  Extension of RIGHT temporal fracture into the RIGHT sphenoid bone and traversing the RIGHT sphenoid sinus.  Though no definite involvement of the carotid canals is seen bilaterally, fracture planes particularly on the RIGHT are in close proximity to the carotid canals and if there is clinical concern for internal carotid injury or presence of neurologic impairment then consider CTA head to assess the internal carotid arteries at the skullbase.  Associated soft tissue gas caudal to the skullbase as well as intracranial gas at the cavernous sinuses, sella turcica and suprasellar regions.  Findings called to Dr. Modesto Charon on 12/06/2013 at 1650  hr.   Electronically Signed   By: Ulyses Southward M.D.   On: 12/06/2013 16:52   Ct Chest W Contrast  12/06/2013   CLINICAL DATA:  Trauma. Tenth between 4 clip in and around back. Severe right lower quadrant pain. Right arm limited range of motion.  EXAM: CT CHEST, ABDOMEN, AND PELVIS WITH CONTRAST  TECHNIQUE: Multidetector CT imaging of the chest, abdomen and pelvis was performed following the standard protocol during bolus administration of intravenous contrast.  CONTRAST:  90 cc Omnipaque 300 intravenous  COMPARISON:  None.  FINDINGS: CT CHEST FINDINGS  THORACIC INLET/BODY WALL:  No acute abnormality.  MEDIASTINUM:  Normal heart size. No pericardial effusion. No acute vascular abnormality. No adenopathy.  LUNG WINDOWS:  No contusion, hemothorax, or pneumothorax. Mild scattered atelectasis. Incidental air trapping in the apical left upper lobe; noted history of asthma.  OSSEOUS:  See below  CT ABDOMEN AND PELVIS FINDINGS  BODY WALL: Subcutaneous reticulation in the right lower lumbar region consistent with contusion.  Liver: No focal abnormality.  Biliary: No evidence of biliary obstruction or stone.  Pancreas: Unremarkable.  Spleen: Unremarkable.  Adrenals: Unremarkable.  Kidneys and ureters: No evidence of injury.  Bladder: Unremarkable.  Reproductive: Unremarkable.  Bowel: No evidence of injury. Extensive distal colonic diverticulosis.  Retroperitoneum: No mass or adenopathy.  Peritoneum: No free fluid or gas.  Vascular: No acute findings.  OSSEOUS: No findings to explain right shoulder symptoms. Note that the upper scapula, humerus, and distal clavicle is not visualized.  L1 through L4 right transverse process fractures with up to moderate displacement/distraction. There is associated hemorrhage within the right quadratus lumborum and neighboring deep back muscles. No measurable hematoma or evidence of active hemorrhage.  IMPRESSION: 1. L1 through L4 right transverse process fractures. 2. No acute intrathoracic  or intra-abdominal findings. 3. Extensive colonic diverticulosis.   Electronically Signed   By: Tiburcio Pea M.D.   On: 12/06/2013 16:04   Ct Cervical Spine Wo Contrast  12/06/2013   CLINICAL DATA:  Patient was pinned between a forklift and rack at work, difficult hearing in both ears, lacerations to LEFT face and LEFT eye  EXAM: CT HEAD WITHOUT CONTRAST  CT MAXILLOFACIAL WITHOUT CONTRAST  CT CERVICAL SPINE WITHOUT CONTRAST  TECHNIQUE: Multidetector CT imaging of the head, cervical spine, and maxillofacial structures were performed using the standard protocol without intravenous contrast. Multiplanar CT image reconstructions of the cervical spine and maxillofacial structures were also generated. RIGHT-side of face marked with a BB.  COMPARISON:  None.  FINDINGS: CT HEAD FINDINGS  Normal ventricular morphology.  No midline shift or mass effect.  Normal appearance of brain parenchyma.  No intracranial hemorrhage, mass lesion or evidence acute infarction.  Air present at the cavernous sinuses bilaterally, in the sella turcica, and suprasellar compatible with basilar skull fracture.  Partially opacified mastoid air cells bilaterally.  Air-fluid levels in RIGHT maxillary and sphenoid sinuses with partial ethmoid opacification bilaterally.  No calvarial fracture identified.  CT MAXILLOFACIAL FINDINGS  Air present within sella turcica, cavernous sinuses and suprasellar region.  Air-fluid level/hemorrhage within RIGHT sphenoid sinus and RIGHT maxillary sinus.  Partial opacification of ethmoid air cells bilaterally.  Small mucosal retention cyst LEFT maxillary sinus.  Frontal sinus and LEFT maxillary sinus otherwise clear.  Partial opacification of mastoid air cells bilaterally.  Alignment of the LEFT ossicles grossly normal.  Disruption of RIGHT ossicular chain with displacement of the malleus.  RIGHT temporal bone fracture extends to the RIGHT sphenoid across the lateral and anterior walls of the RIGHT sphenoid  sinus.  No definite fractures of the orbits, maxillary sinuses or zygomas.  Soft tissue gas identified anterior and inferior to the mastoid air cells bilaterally and tracking inferiorly into the parapharyngeal spaces.  Gas is seen near the courses of the internal carotid arteries bilaterally at the carotid siphons but no discrete fracture planes involving the siphons are visualized.  Intraorbital soft tissue planes clear.  CT CERVICAL SPINE FINDINGS  Partial BILATERAL mastoid air cell opacification.  Prevertebral soft tissues normal thickness.  Minimal scattered disc space narrowing and endplate spur formation.  Vertebral body heights maintained.  No acute cervical spine fracture, subluxation, or bone destruction.  Lung apices clear.  IMPRESSION: No acute intracranial abnormalities.  Degenerative disc disease changes of the cervical spine.  No acute cervical spine abnormalities.  BILATERAL temporal bone fractures with partial opacification of the mastoid air cells bilaterally.  Disruption of the RIGHT ossicular chain.  Extension of RIGHT temporal fracture into the RIGHT sphenoid bone and traversing the RIGHT sphenoid sinus.  Though no definite involvement of the carotid canals is seen bilaterally, fracture planes particularly on the RIGHT are in close proximity to the carotid canals and if there is clinical concern for internal carotid injury or presence of neurologic impairment then consider CTA head to assess the internal carotid arteries at the skullbase.  Associated soft tissue gas caudal to the skullbase as well as intracranial gas at the cavernous sinuses, sella turcica and suprasellar regions.  Findings called to Dr. Modesto Charon on 12/06/2013 at 1650 hr.   Electronically Signed   By: Ulyses Southward M.D.   On: 12/06/2013 16:52   Ct Abdomen Pelvis W Contrast  12/06/2013   CLINICAL DATA:  Trauma. Tenth between 4 clip in and around back. Severe right lower quadrant pain. Right arm limited range of motion.  EXAM: CT  CHEST, ABDOMEN, AND PELVIS WITH CONTRAST  TECHNIQUE: Multidetector CT imaging of the chest, abdomen and pelvis was performed following the standard protocol during bolus administration of intravenous contrast.  CONTRAST:  90 cc Omnipaque 300 intravenous  COMPARISON:  None.  FINDINGS: CT CHEST FINDINGS  THORACIC INLET/BODY WALL:  No acute abnormality.  MEDIASTINUM:  Normal heart size. No pericardial effusion. No acute vascular abnormality. No adenopathy.  LUNG WINDOWS:  No contusion, hemothorax, or pneumothorax. Mild scattered atelectasis. Incidental air trapping in the apical left upper lobe; noted history of asthma.  OSSEOUS:  See below  CT ABDOMEN AND PELVIS FINDINGS  BODY WALL: Subcutaneous reticulation in the right lower lumbar region consistent with contusion.  Liver: No focal abnormality.  Biliary: No evidence of biliary obstruction or stone.  Pancreas: Unremarkable.  Spleen: Unremarkable.  Adrenals: Unremarkable.  Kidneys and ureters: No evidence of injury.  Bladder: Unremarkable.  Reproductive: Unremarkable.  Bowel: No evidence of injury. Extensive distal colonic diverticulosis.  Retroperitoneum: No mass or adenopathy.  Peritoneum: No free fluid or gas.  Vascular: No acute findings.  OSSEOUS: No findings to explain right shoulder symptoms. Note that the upper scapula, humerus, and distal clavicle is not visualized.  L1 through L4 right transverse process fractures with up to moderate displacement/distraction. There is associated hemorrhage within the right quadratus lumborum and neighboring deep back muscles. No measurable hematoma or evidence of active hemorrhage.  IMPRESSION: 1. L1 through L4 right transverse process fractures. 2. No acute intrathoracic or intra-abdominal findings. 3. Extensive colonic diverticulosis.   Electronically Signed   By: Tiburcio Pea M.D.   On: 12/06/2013 16:04   Dg Pelvis Portable  12/06/2013   CLINICAL DATA:  The patient was pinned between the wall and a forklift well  operating a forklift. Facial trauma.  EXAM: PORTABLE PELVIS 1-2 VIEWS  COMPARISON:  None.  FINDINGS: There is no evidence of pelvic fracture or dislocation. There are mild degenerative joint changes with narrowed bilateral hip joint spaces.  IMPRESSION: No acute fracture or dislocation.   Electronically Signed   By: Sherian Rein M.D.   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 Facial trauma Left eyelid, missing tooth top left and Right ear. LOC after intial accident, back at baseline. Back pain.  EXAM: PORTABLE CHEST - 1 VIEW  COMPARISON:  None.  FINDINGS: The heart size and mediastinal contours are within normal limits. Both lungs are clear. The visualized skeletal structures are unremarkable.  IMPRESSION: No active disease.   Electronically Signed   By: Elige Ko   On: 12/06/2013 15:00   Ct Maxillofacial Wo Cm  12/06/2013   CLINICAL DATA:  Patient was pinned between a forklift and rack at work, difficult hearing in both ears, lacerations to LEFT face and LEFT eye  EXAM: CT HEAD WITHOUT CONTRAST  CT MAXILLOFACIAL WITHOUT CONTRAST  CT CERVICAL SPINE WITHOUT CONTRAST  TECHNIQUE: Multidetector CT imaging of the head, cervical spine, and maxillofacial structures were performed using the standard protocol without intravenous contrast. Multiplanar CT image reconstructions of the cervical spine and maxillofacial structures were also generated. RIGHT-side of face marked with a BB.  COMPARISON:  None.  FINDINGS: CT HEAD FINDINGS  Normal ventricular morphology.  No midline shift or mass effect.  Normal appearance of brain parenchyma.  No intracranial hemorrhage, mass lesion or evidence acute infarction.  Air present at the cavernous sinuses bilaterally, in the sella turcica, and suprasellar compatible with basilar skull fracture.  Partially opacified mastoid air cells bilaterally.  Air-fluid levels  in RIGHT maxillary and sphenoid sinuses with partial ethmoid opacification bilaterally.  No calvarial fracture identified.  CT MAXILLOFACIAL FINDINGS  Air present within sella turcica, cavernous sinuses and suprasellar region.  Air-fluid level/hemorrhage within RIGHT sphenoid sinus and RIGHT maxillary sinus.  Partial opacification of ethmoid air cells bilaterally.  Small mucosal retention cyst LEFT maxillary sinus.  Frontal sinus and LEFT maxillary sinus otherwise clear.  Partial opacification of mastoid air cells bilaterally.  Alignment of the LEFT ossicles grossly normal.  Disruption of RIGHT ossicular chain with displacement of the malleus.  RIGHT temporal bone fracture extends to the RIGHT sphenoid across the lateral and anterior walls of the RIGHT sphenoid sinus.  No definite fractures  of the orbits, maxillary sinuses or zygomas.  Soft tissue gas identified anterior and inferior to the mastoid air cells bilaterally and tracking inferiorly into the parapharyngeal spaces.  Gas is seen near the courses of the internal carotid arteries bilaterally at the carotid siphons but no discrete fracture planes involving the siphons are visualized.  Intraorbital soft tissue planes clear.  CT CERVICAL SPINE FINDINGS  Partial BILATERAL mastoid air cell opacification.  Prevertebral soft tissues normal thickness.  Minimal scattered disc space narrowing and endplate spur formation.  Vertebral body heights maintained.  No acute cervical spine fracture, subluxation, or bone destruction.  Lung apices clear.  IMPRESSION: No acute intracranial abnormalities.  Degenerative disc disease changes of the cervical spine.  No acute cervical spine abnormalities.  BILATERAL temporal bone fractures with partial opacification of the mastoid air cells bilaterally.  Disruption of the RIGHT ossicular chain.  Extension of RIGHT temporal fracture into the RIGHT sphenoid bone and traversing the RIGHT sphenoid sinus.  Though no definite involvement of  the carotid canals is seen bilaterally, fracture planes particularly on the RIGHT are in close proximity to the carotid canals and if there is clinical concern for internal carotid injury or presence of neurologic impairment then consider CTA head to assess the internal carotid arteries at the skullbase.  Associated soft tissue gas caudal to the skullbase as well as intracranial gas at the cavernous sinuses, sella turcica and suprasellar regions.  Findings called to Dr. Modesto CharonWong on 12/06/2013 at 1650 hr.   Electronically Signed   By: Ulyses SouthwardMark  Boles M.D.   On: 12/06/2013 16:52     EKG Interpretation None      MDM   Final diagnoses:  None   2:24 PM Pt is a 53 y.o. male with no pertinent PMHX who presents to the ED with non leveled trauma. 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 Positive LOC. No amnesia. Ambulatory. Endorsing right flank pain. Lacerations and facial injuries noted by EMS. No chest, abdomen or hip pain. Moving all extremities. No nausea, vomiting or diarrhea  On exam: facial injuries as documented above. Concern for through and through laceration to right ear. Plan to consult ENT. Also evidence of cranial nerve 7 palsy. Plan for full trauma scans and trauma labs  Review of labs: CMP: hypokalemia, no elevated LFTs INR: 1.14 CBC: leukocytosis, H&H 14.0/42.0 ETOH: <11  CXR AP portable per my read showed no rib fractures no ptx  XR ap pelvis no evidence of acute fracture or dislocation  CT head wo contrast negative for ihemorrhage  CT chest abdomen pelvis: L1-L4 TP fractures. Colonic diverticulosis. Hemorrhage of the right quadratus lumborum.   CT face wo contrast concerning for bilateral temporal bone fracture. Fracture of the right ossicle chain with extension into sphenoid sinus with air fluid levels. Concern for proximity to carotid canal  CT cervical spine negative for acute fracture  Given hemorrhage of the right  quadratus, multiple injuries: pneumocephalus, will consult trauma for likely admission  Discussed with ENt trauma: plan for evaluation at bedside.  Plan per trauma to consult neurosurgery and admission. Given concern for close proximity to carotid canal will order CTa head neck  Labs, EKG and imaging reviewed by myself and considered in medical decision making if ordered.  Imaging interpreted by radiology. Pt was discussed with my attending, Dr. Alessandra Bevelsampos     Shreyas Piatkowski Peter [place], MD 12/06/13 1704  Lyanne CoKevin M Campos, MD 12/07/13 [phone]

## 2013-12-06 NOTE — ED Notes (Signed)
Laceration 2 cm posterior mandible Right ear through and through laceration Left eye lid 1.5 cm

## 2013-12-06 NOTE — ED Notes (Signed)
MD at bedside. 

## 2013-12-06 NOTE — ED Notes (Signed)
Placed on 2L ?

## 2013-12-06 NOTE — Consult Note (Signed)
Michael Hooper, Michael Hooper 53 y.o., male [id number]     Chief Complaint: head trauma  HPI: 53 yo wm allegedly pinned between forklift and ?wall around 1230 today.  Managed to extricate self, then had LOC with prompt recovery.  Arrived at [place].  RIGHT facial weakness noted 1410.  Also RIGHT OD limited ROM.  CT head and maxillofacial shows fx's of lateral mastoid cortex both sides, but no true transverse or longitudinal temporal bone fx's.  Air in sella and cavernous sinus bilat.  Air fluid levels in sinuses.  Transverse skull base fx's through sphenoid bones.  CTA shows intact carotid arteries bilat.    Pt notes blurred vision OD and double vision.  Poor hearing AD. Face feels heavy.   No trismus or malocclusion.  No prior similar symptoms.    LEFT eyelid and RIGHT auricle and post auricular scalp lac's closed by Dr. Migdalia Dk.    PMH: Past Medical History  Diagnosis Date  . Asthma     Surg [facility]:UXNATFT reviewed. No pertinent past surgical history.  FHx:   Family History  Problem Relation Age of Onset  . Heart failure Mother   . Heart failure Father   . Asthma Brother    SocHx:  reports that he has never smoked. He does not have any smokeless tobacco history on file. He reports that he drinks about 2.4 oz of alcohol per week. He reports that he does not use illicit drugs.  ALLERGIES: No Known Allergies  No prescriptions prior to admission    Results for orders placed or performed during the hospital encounter of 12/06/13 (from the past 48 hour(s))  Comprehensive metabolic panel     Status: Abnormal   Collection Time: 12/06/13  2:08 PM  Result Value Ref Range   Sodium 141 137 - 147 mEq/L   Potassium 3.3 (L) 3.7 - 5.3 mEq/L   Chloride 100 96 - 112 mEq/L   CO2 24 19 - 32 mEq/L   Glucose, Bld 125 (H) 70 - 99 mg/dL   BUN 15 6 - 23 mg/dL   Creatinine, Ser 0.96 0.50 - 1.35 mg/dL   Calcium 8.7 8.4 - 10.5 mg/dL   Total Protein 7.1 6.0 - 8.3 g/dL   Albumin 4.0 3.5 - 5.2 g/dL   AST 28 0 - 37  U/L    Comment: HEMOLYSIS AT THIS LEVEL MAY AFFECT RESULT   ALT 28 0 - 53 U/L   Alkaline Phosphatase 78 39 - 117 U/L   Total Bilirubin 0.3 0.3 - 1.2 mg/dL   GFR calc non Af Amer >90 >90 mL/min   GFR calc Af Amer >90 >90 mL/min    Comment: (NOTE) The eGFR has been calculated using the CKD EPI equation. This calculation has not been validated in all clinical situations. eGFR's persistently <90 mL/min signify possible Chronic Kidney Disease.    Anion gap 17 (H) 5 - 15  Protime-INR     Status: None   Collection Time: 12/06/13  2:08 PM  Result Value Ref Range   Prothrombin Time 14.8 11.6 - 15.2 seconds   INR 1.14 0.00 - 1.49  CBC WITH DIFFERENTIAL     Status: Abnormal   Collection Time: 12/06/13  2:08 PM  Result Value Ref Range   WBC 15.7 (H) 4.0 - 10.5 K/uL   RBC 4.50 4.22 - 5.81 MIL/uL   Hemoglobin 14.0 13.0 - 17.0 g/dL   HCT 42.0 39.0 - 52.0 %   MCV 93.3 78.0 - 100.0 fL  MCH 31.1 26.0 - 34.0 pg   MCHC 33.3 30.0 - 36.0 g/dL   RDW 12.7 11.5 - 15.5 %   Platelets 236 150 - 400 K/uL   Neutrophils Relative % 70 43 - 77 %   Neutro Abs 10.9 (H) 1.7 - 7.7 K/uL   Lymphocytes Relative 19 12 - 46 %   Lymphs Abs 3.0 0.7 - 4.0 K/uL   Monocytes Relative 7 3 - 12 %   Monocytes Absolute 1.2 (H) 0.1 - 1.0 K/uL   Eosinophils Relative 4 0 - 5 %   Eosinophils Absolute 0.6 0.0 - 0.7 K/uL   Basophils Relative 0 0 - 1 %   Basophils Absolute 0.0 0.0 - 0.1 K/uL  Ethanol     Status: None   Collection Time: 12/06/13  2:08 PM  Result Value Ref Range   Alcohol, Ethyl (B) <11 0 - 11 mg/dL    Comment:        LOWEST DETECTABLE LIMIT FOR SERUM ALCOHOL IS 11 mg/dL FOR MEDICAL PURPOSES ONLY   Type and screen     Status: None   Collection Time: 12/06/13  2:08 PM  Result Value Ref Range   ABO/RH(D) A POS    Antibody Screen NEG    Sample Expiration 12/09/2013   ABO/Rh     Status: None   Collection Time: 12/06/13  2:08 PM  Result Value Ref Range   ABO/RH(D) A POS    Ct Angio Head W/cm &/or Wo  Cm  12/06/2013   CLINICAL DATA:  Hit by forklift. Skull fractures. Rule out arterial injury.  EXAM: CT ANGIOGRAPHY HEAD AND NECK  TECHNIQUE: Multidetector CT imaging of the head and neck was performed using the standard protocol during bolus administration of intravenous contrast. Multiplanar CT image reconstructions and MIPs were obtained to evaluate the vascular anatomy. Carotid stenosis measurements (when applicable) are obtained utilizing NASCET criteria, using the distal internal carotid diameter as the denominator.  CONTRAST:  67m OMNIPAQUE IOHEXOL 350 MG/ML SOLN  COMPARISON:  CT the head and cervical spine earlier today  FINDINGS: CTA HEAD FINDINGS  Ventricle size is normal. No intracranial hemorrhage. There is pneumocephalus with gas in the right cavernous sinus and between the clivus and basilar and suprasellar cistern. This is compatible with basilar skull fractures. Bilateral mastoid sinus fractures are noted. There is gas throughout the deep soft tissues of the neck presumably related to the skullbase fractures. Air-fluid levels in the right maxillary and sphenoid sinuses.  Both vertebral arteries are normal. The basilar is normal. Cerebellar and posterior cerebral arteries are normal.  Cavernous carotid is normal bilaterally. No evidence of carotid dissection or injury. Petrous segment is normal. Anterior and middle cerebral arteries are normal. Negative for aneurysm.  Review of the MIP images confirms the above findings.  CTA NECK FINDINGS  Proximal great vessels are normal. No mass or adenopathy in the neck.  Carotid artery is normal bilaterally. Negative for carotid injury. No atherosclerotic disease or dissection.  Both vertebral arteries are equal in size and widely patent. No significant atherosclerotic disease or dissection.  Review of the MIP images confirms the above findings.  IMPRESSION: Negative for arterial injury of the carotid or basilar artery bilaterally.  Skullbase fractures with  pneumocephalus.   Electronically Signed   By: CFranchot GalloM.D.   On: 12/06/2013 18:13   Ct Head Wo Contrast  12/06/2013   CLINICAL DATA:  Patient was pinned between a forklift and rack at work, difficult hearing in both  ears, lacerations to LEFT face and LEFT eye  EXAM: CT HEAD WITHOUT CONTRAST  CT MAXILLOFACIAL WITHOUT CONTRAST  CT CERVICAL SPINE WITHOUT CONTRAST  TECHNIQUE: Multidetector CT imaging of the head, cervical spine, and maxillofacial structures were performed using the standard protocol without intravenous contrast. Multiplanar CT image reconstructions of the cervical spine and maxillofacial structures were also generated. RIGHT-side of face marked with a BB.  COMPARISON:  None.  FINDINGS: CT HEAD FINDINGS  Normal ventricular morphology.  No midline shift or mass effect.  Normal appearance of brain parenchyma.  No intracranial hemorrhage, mass lesion or evidence acute infarction.  Air present at the cavernous sinuses bilaterally, in the sella turcica, and suprasellar compatible with basilar skull fracture.  Partially opacified mastoid air cells bilaterally.  Air-fluid levels in RIGHT maxillary and sphenoid sinuses with partial ethmoid opacification bilaterally.  No calvarial fracture identified.  CT MAXILLOFACIAL FINDINGS  Air present within sella turcica, cavernous sinuses and suprasellar region.  Air-fluid level/hemorrhage within RIGHT sphenoid sinus and RIGHT maxillary sinus.  Partial opacification of ethmoid air cells bilaterally.  Small mucosal retention cyst LEFT maxillary sinus.  Frontal sinus and LEFT maxillary sinus otherwise clear.  Partial opacification of mastoid air cells bilaterally.  Alignment of the LEFT ossicles grossly normal.  Disruption of RIGHT ossicular chain with displacement of the malleus.  RIGHT temporal bone fracture extends to the RIGHT sphenoid across the lateral and anterior walls of the RIGHT sphenoid sinus.  No definite fractures of the orbits, maxillary sinuses or  zygomas.  Soft tissue gas identified anterior and inferior to the mastoid air cells bilaterally and tracking inferiorly into the parapharyngeal spaces.  Gas is seen near the courses of the internal carotid arteries bilaterally at the carotid siphons but no discrete fracture planes involving the siphons are visualized.  Intraorbital soft tissue planes clear.  CT CERVICAL SPINE FINDINGS  Partial BILATERAL mastoid air cell opacification.  Prevertebral soft tissues normal thickness.  Minimal scattered disc space narrowing and endplate spur formation.  Vertebral body heights maintained.  No acute cervical spine fracture, subluxation, or bone destruction.  Lung apices clear.  IMPRESSION: No acute intracranial abnormalities.  Degenerative disc disease changes of the cervical spine.  No acute cervical spine abnormalities.  BILATERAL temporal bone fractures with partial opacification of the mastoid air cells bilaterally.  Disruption of the RIGHT ossicular chain.  Extension of RIGHT temporal fracture into the RIGHT sphenoid bone and traversing the RIGHT sphenoid sinus.  Though no definite involvement of the carotid canals is seen bilaterally, fracture planes particularly on the RIGHT are in close proximity to the carotid canals and if there is clinical concern for internal carotid injury or presence of neurologic impairment then consider CTA head to assess the internal carotid arteries at the skullbase.  Associated soft tissue gas caudal to the skullbase as well as intracranial gas at the cavernous sinuses, sella turcica and suprasellar regions.  Findings called to Dr. Jacelyn Grip on 12/06/2013 at 1650 hr.   Electronically Signed   By: Lavonia Dana M.D.   On: 12/06/2013 16:52   Ct Angio Neck W/cm &/or Wo/cm  12/06/2013   CLINICAL DATA:  Hit by forklift. Skull fractures. Rule out arterial injury.  EXAM: CT ANGIOGRAPHY HEAD AND NECK  TECHNIQUE: Multidetector CT imaging of the head and neck was performed using the standard protocol  during bolus administration of intravenous contrast. Multiplanar CT image reconstructions and MIPs were obtained to evaluate the vascular anatomy. Carotid stenosis measurements (when applicable) are obtained utilizing NASCET  criteria, using the distal internal carotid diameter as the denominator.  CONTRAST:  16m OMNIPAQUE IOHEXOL 350 MG/ML SOLN  COMPARISON:  CT the head and cervical spine earlier today  FINDINGS: CTA HEAD FINDINGS  Ventricle size is normal. No intracranial hemorrhage. There is pneumocephalus with gas in the right cavernous sinus and between the clivus and basilar and suprasellar cistern. This is compatible with basilar skull fractures. Bilateral mastoid sinus fractures are noted. There is gas throughout the deep soft tissues of the neck presumably related to the skullbase fractures. Air-fluid levels in the right maxillary and sphenoid sinuses.  Both vertebral arteries are normal. The basilar is normal. Cerebellar and posterior cerebral arteries are normal.  Cavernous carotid is normal bilaterally. No evidence of carotid dissection or injury. Petrous segment is normal. Anterior and middle cerebral arteries are normal. Negative for aneurysm.  Review of the MIP images confirms the above findings.  CTA NECK FINDINGS  Proximal great vessels are normal. No mass or adenopathy in the neck.  Carotid artery is normal bilaterally. Negative for carotid injury. No atherosclerotic disease or dissection.  Both vertebral arteries are equal in size and widely patent. No significant atherosclerotic disease or dissection.  Review of the MIP images confirms the above findings.  IMPRESSION: Negative for arterial injury of the carotid or basilar artery bilaterally.  Skullbase fractures with pneumocephalus.   Electronically Signed   By: CFranchot GalloM.D.   On: 12/06/2013 18:13   Ct Chest W Contrast  12/06/2013   CLINICAL DATA:  Trauma. Tenth between 4 clip in and around back. Severe right lower quadrant pain. Right  arm limited range of motion.  EXAM: CT CHEST, ABDOMEN, AND PELVIS WITH CONTRAST  TECHNIQUE: Multidetector CT imaging of the chest, abdomen and pelvis was performed following the standard protocol during bolus administration of intravenous contrast.  CONTRAST:  90 cc Omnipaque 300 intravenous  COMPARISON:  None.  FINDINGS: CT CHEST FINDINGS  THORACIC INLET/BODY WALL:  No acute abnormality.  MEDIASTINUM:  Normal heart size. No pericardial effusion. No acute vascular abnormality. No adenopathy.  LUNG WINDOWS:  No contusion, hemothorax, or pneumothorax. Mild scattered atelectasis. Incidental air trapping in the apical left upper lobe; noted history of asthma.  OSSEOUS:  See below  CT ABDOMEN AND PELVIS FINDINGS  BODY WALL: Subcutaneous reticulation in the right lower lumbar region consistent with contusion.  Liver: No focal abnormality.  Biliary: No evidence of biliary obstruction or stone.  Pancreas: Unremarkable.  Spleen: Unremarkable.  Adrenals: Unremarkable.  Kidneys and ureters: No evidence of injury.  Bladder: Unremarkable.  Reproductive: Unremarkable.  Bowel: No evidence of injury. Extensive distal colonic diverticulosis.  Retroperitoneum: No mass or adenopathy.  Peritoneum: No free fluid or gas.  Vascular: No acute findings.  OSSEOUS: No findings to explain right shoulder symptoms. Note that the upper scapula, humerus, and distal clavicle is not visualized.  L1 through L4 right transverse process fractures with up to moderate displacement/distraction. There is associated hemorrhage within the right quadratus lumborum and neighboring deep back muscles. No measurable hematoma or evidence of active hemorrhage.  IMPRESSION: 1. L1 through L4 right transverse process fractures. 2. No acute intrathoracic or intra-abdominal findings. 3. Extensive colonic diverticulosis.   Electronically Signed   By: JJorje GuildM.D.   On: 12/06/2013 16:04   Ct Cervical Spine Wo Contrast  12/06/2013   CLINICAL DATA:  Patient was  pinned between a forklift and rack at work, difficult hearing in both ears, lacerations to LEFT face and LEFT eye  EXAM: CT HEAD WITHOUT CONTRAST  CT MAXILLOFACIAL WITHOUT CONTRAST  CT CERVICAL SPINE WITHOUT CONTRAST  TECHNIQUE: Multidetector CT imaging of the head, cervical spine, and maxillofacial structures were performed using the standard protocol without intravenous contrast. Multiplanar CT image reconstructions of the cervical spine and maxillofacial structures were also generated. RIGHT-side of face marked with a BB.  COMPARISON:  None.  FINDINGS: CT HEAD FINDINGS  Normal ventricular morphology.  No midline shift or mass effect.  Normal appearance of brain parenchyma.  No intracranial hemorrhage, mass lesion or evidence acute infarction.  Air present at the cavernous sinuses bilaterally, in the sella turcica, and suprasellar compatible with basilar skull fracture.  Partially opacified mastoid air cells bilaterally.  Air-fluid levels in RIGHT maxillary and sphenoid sinuses with partial ethmoid opacification bilaterally.  No calvarial fracture identified.  CT MAXILLOFACIAL FINDINGS  Air present within sella turcica, cavernous sinuses and suprasellar region.  Air-fluid level/hemorrhage within RIGHT sphenoid sinus and RIGHT maxillary sinus.  Partial opacification of ethmoid air cells bilaterally.  Small mucosal retention cyst LEFT maxillary sinus.  Frontal sinus and LEFT maxillary sinus otherwise clear.  Partial opacification of mastoid air cells bilaterally.  Alignment of the LEFT ossicles grossly normal.  Disruption of RIGHT ossicular chain with displacement of the malleus.  RIGHT temporal bone fracture extends to the RIGHT sphenoid across the lateral and anterior walls of the RIGHT sphenoid sinus.  No definite fractures of the orbits, maxillary sinuses or zygomas.  Soft tissue gas identified anterior and inferior to the mastoid air cells bilaterally and tracking inferiorly into the parapharyngeal spaces.  Gas  is seen near the courses of the internal carotid arteries bilaterally at the carotid siphons but no discrete fracture planes involving the siphons are visualized.  Intraorbital soft tissue planes clear.  CT CERVICAL SPINE FINDINGS  Partial BILATERAL mastoid air cell opacification.  Prevertebral soft tissues normal thickness.  Minimal scattered disc space narrowing and endplate spur formation.  Vertebral body heights maintained.  No acute cervical spine fracture, subluxation, or bone destruction.  Lung apices clear.  IMPRESSION: No acute intracranial abnormalities.  Degenerative disc disease changes of the cervical spine.  No acute cervical spine abnormalities.  BILATERAL temporal bone fractures with partial opacification of the mastoid air cells bilaterally.  Disruption of the RIGHT ossicular chain.  Extension of RIGHT temporal fracture into the RIGHT sphenoid bone and traversing the RIGHT sphenoid sinus.  Though no definite involvement of the carotid canals is seen bilaterally, fracture planes particularly on the RIGHT are in close proximity to the carotid canals and if there is clinical concern for internal carotid injury or presence of neurologic impairment then consider CTA head to assess the internal carotid arteries at the skullbase.  Associated soft tissue gas caudal to the skullbase as well as intracranial gas at the cavernous sinuses, sella turcica and suprasellar regions.  Findings called to Dr. Jacelyn Grip on 12/06/2013 at 1650 hr.   Electronically Signed   By: Lavonia Dana M.D.   On: 12/06/2013 16:52   Ct Abdomen Pelvis W Contrast  12/06/2013   CLINICAL DATA:  Trauma. Tenth between 4 clip in and around back. Severe right lower quadrant pain. Right arm limited range of motion.  EXAM: CT CHEST, ABDOMEN, AND PELVIS WITH CONTRAST  TECHNIQUE: Multidetector CT imaging of the chest, abdomen and pelvis was performed following the standard protocol during bolus administration of intravenous contrast.  CONTRAST:  90 cc  Omnipaque 300 intravenous  COMPARISON:  None.  FINDINGS: CT CHEST FINDINGS  THORACIC INLET/BODY WALL:  No acute abnormality.  MEDIASTINUM:  Normal heart size. No pericardial effusion. No acute vascular abnormality. No adenopathy.  LUNG WINDOWS:  No contusion, hemothorax, or pneumothorax. Mild scattered atelectasis. Incidental air trapping in the apical left upper lobe; noted history of asthma.  OSSEOUS:  See below  CT ABDOMEN AND PELVIS FINDINGS  BODY WALL: Subcutaneous reticulation in the right lower lumbar region consistent with contusion.  Liver: No focal abnormality.  Biliary: No evidence of biliary obstruction or stone.  Pancreas: Unremarkable.  Spleen: Unremarkable.  Adrenals: Unremarkable.  Kidneys and ureters: No evidence of injury.  Bladder: Unremarkable.  Reproductive: Unremarkable.  Bowel: No evidence of injury. Extensive distal colonic diverticulosis.  Retroperitoneum: No mass or adenopathy.  Peritoneum: No free fluid or gas.  Vascular: No acute findings.  OSSEOUS: No findings to explain right shoulder symptoms. Note that the upper scapula, humerus, and distal clavicle is not visualized.  L1 through L4 right transverse process fractures with up to moderate displacement/distraction. There is associated hemorrhage within the right quadratus lumborum and neighboring deep back muscles. No measurable hematoma or evidence of active hemorrhage.  IMPRESSION: 1. L1 through L4 right transverse process fractures. 2. No acute intrathoracic or intra-abdominal findings. 3. Extensive colonic diverticulosis.   Electronically Signed   By: Jorje Guild M.D.   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 Facial trauma.  EXAM: PORTABLE PELVIS 1-2 VIEWS  COMPARISON:  None.  FINDINGS: There is no evidence of pelvic fracture or dislocation. There are mild degenerative joint changes with narrowed bilateral hip  joint spaces.  IMPRESSION: No acute fracture or dislocation.   Electronically Signed   By: Abelardo Diesel M.D.   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 Facial trauma Left eyelid, missing tooth top left and Right ear. LOC after intial accident, back at baseline. Back pain.  EXAM: PORTABLE CHEST - 1 VIEW  COMPARISON:  None.  FINDINGS: The heart size and mediastinal contours are within normal limits. Both lungs are clear. The visualized skeletal structures are unremarkable.  IMPRESSION: No active disease.   Electronically Signed   By: Kathreen Devoid   On: 12/06/2013 15:00   Ct Maxillofacial Wo Cm  12/06/2013   CLINICAL DATA:  Patient was pinned between a forklift and rack at work, difficult hearing in both ears, lacerations to LEFT face and LEFT eye  EXAM: CT HEAD WITHOUT CONTRAST  CT MAXILLOFACIAL WITHOUT CONTRAST  CT CERVICAL SPINE WITHOUT CONTRAST  TECHNIQUE: Multidetector CT imaging of the head, cervical spine, and maxillofacial structures were performed using the standard protocol without intravenous contrast. Multiplanar CT image reconstructions of the cervical spine and maxillofacial structures were also generated. RIGHT-side of face marked with a BB.  COMPARISON:  None.  FINDINGS: CT HEAD FINDINGS  Normal ventricular morphology.  No midline shift or mass effect.  Normal appearance of brain parenchyma.  No intracranial hemorrhage, mass lesion or evidence acute infarction.  Air present at the cavernous sinuses bilaterally, in the sella turcica, and suprasellar compatible with basilar skull fracture.  Partially opacified mastoid air cells bilaterally.  Air-fluid levels in RIGHT maxillary and sphenoid sinuses with partial ethmoid opacification bilaterally.  No calvarial fracture identified.  CT MAXILLOFACIAL FINDINGS  Air present within  sella turcica, cavernous sinuses and suprasellar region.   Air-fluid level/hemorrhage within RIGHT sphenoid sinus and RIGHT maxillary sinus.  Partial opacification of ethmoid air cells bilaterally.  Small mucosal retention cyst LEFT maxillary sinus.  Frontal sinus and LEFT maxillary sinus otherwise clear.  Partial opacification of mastoid air cells bilaterally.  Alignment of the LEFT ossicles grossly normal.  Disruption of RIGHT ossicular chain with displacement of the malleus.  RIGHT temporal bone fracture extends to the RIGHT sphenoid across the lateral and anterior walls of the RIGHT sphenoid sinus.  No definite fractures of the orbits, maxillary sinuses or zygomas.  Soft tissue gas identified anterior and inferior to the mastoid air cells bilaterally and tracking inferiorly into the parapharyngeal spaces.  Gas is seen near the courses of the internal carotid arteries bilaterally at the carotid siphons but no discrete fracture planes involving the siphons are visualized.  Intraorbital soft tissue planes clear.  CT CERVICAL SPINE FINDINGS  Partial BILATERAL mastoid air cell opacification.  Prevertebral soft tissues normal thickness.  Minimal scattered disc space narrowing and endplate spur formation.  Vertebral body heights maintained.  No acute cervical spine fracture, subluxation, or bone destruction.  Lung apices clear.  IMPRESSION: No acute intracranial abnormalities.  Degenerative disc disease changes of the cervical spine.  No acute cervical spine abnormalities.  BILATERAL temporal bone fractures with partial opacification of the mastoid air cells bilaterally.  Disruption of the RIGHT ossicular chain.  Extension of RIGHT temporal fracture into the RIGHT sphenoid bone and traversing the RIGHT sphenoid sinus.  Though no definite involvement of the carotid canals is seen bilaterally, fracture planes particularly on the RIGHT are in close proximity to the carotid canals and if there is clinical concern for internal carotid injury or presence of neurologic impairment  then consider CTA head to assess the internal carotid arteries at the skullbase.  Associated soft tissue gas caudal to the skullbase as well as intracranial gas at the cavernous sinuses, sella turcica and suprasellar regions.  Findings called to Dr. Jacelyn Grip on 12/06/2013 at 1650 hr.   Electronically Signed   By: Lavonia Dana M.D.   On: 12/06/2013 16:52     Blood pressure 111/66, pulse 83, temperature 97.4 F (36.3 C), temperature source Axillary, resp. rate 11, SpO2 97 %.  PHYSICAL EXAM: Overall appearance:  Facial lacerations.sleepy but easily arousable and appropriate.  Head:  No gross bony abnormalities, asymmetries or step offs. Ears:  Lac RIGHT auricle closed.  Sl blood RIGHT EAC but not obstructed.  LEFT canal dry and scaly.  Superficial excoriations LEFT temporal and post auricular scalp, but no true Battle's sign.   Nose:  Mild septal corrugation but good airway bilat. Oral Cavity:  Dried blood.  Possible missing LEFT upper lateral incisor.  No trismus.  Occlusion seems pre-morbid. Oral Pharynx/Hypopharynx/Larynx:  Old blood. Neuro:  Complete RIGHT peripheral CN VII paralysis with decent eyelid tone and moderate Bell's phenomenon.  RIGHT CN VI paralysis with no lateral gaze OD.  Subjective vertigo with RIGHT lateral gaze, but no nystagmus.   Tuning fork testing equivocal to RIGHT at 512 Hz.  Rinne neg AU at 512 Hz (BC>AC).   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  Mastoid system A-F levels bilat.  Obvious ossicular disruption and dislocation, AD.  No obvious injury to Fallopian (facial) canal.  Complete RIGHT peripheral facial paralysis, apparently immediate. RIGHT abducens paralysis.    Careful precautions OD, including Ophth ointment  and taping at night, and artificial tears when awake.  I will call [facility] regarding need for possible facial nerve exploration or decompression.  Will eventually need ossiculoplasty  AD.  I think his sensorineural hearing is likely intact or nearly so AU.  Will need Ophth consultation regarding diplopia.  Will ask for Audiology consultation for formal audiometry.    Jodi Marble 14/09/9690, 7:47 PM

## 2013-12-06 NOTE — Consult Note (Addendum)
Reason for Consult: Facial trauma Referring Physician: [name] is an 53 y.o. male.  HPI: The patient is a 53 yrs old wm here with his wife and son.  He had a traumatic injury at work today.  His head was reported to have been crushed between a wall and moving equipment.  He is alert and oriented.  His complains of mild nausea.  Zofran was given.  He is able to place his teeth together.  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  He complains of an inability to hear from the right ear.  The right ear has a full thickness laceration through the helix horizontally and includes the postauricular skin for a length of 8 cm.  The left upper eye lid has a stellate laceration.  He has been seen by neurosurgery.  Past Medical History  Diagnosis Date  . Asthma     History reviewed. No pertinent past surgical history.  Family History  Problem Relation Age of Onset  . Heart failure Mother   . Heart failure Father   . Asthma Brother     Social History:  reports that he has never smoked. He does not have any smokeless tobacco history on file. He reports that he drinks about 2.4 oz of alcohol per week. He reports that he does not use illicit drugs.  Allergies: No Known Allergies  Medications: I have reviewed the patient's current medications.  Results for orders placed or performed during the hospital encounter of 12/06/13 (from the past 48 hour(s))  Comprehensive metabolic panel     Status: Abnormal   Collection Time: 12/06/13  2:08 PM  Result Value Ref Range   Sodium 141 137 - 147 mEq/L   Potassium 3.3 (L) 3.7 - 5.3 mEq/L   Chloride 100 96 - 112 mEq/L   CO2 24 19 - 32 mEq/L   Glucose, Bld 125 (H) 70 - 99 mg/dL   BUN 15 6 - 23 mg/dL   Creatinine, Ser 0.96 0.50 - 1.35 mg/dL   Calcium 8.7 8.4 - 10.5 mg/dL   Total Protein 7.1 6.0 - 8.3 g/dL   Albumin 4.0 3.5 - 5.2 g/dL   AST 28 0 - 37 U/L    Comment:  HEMOLYSIS AT THIS LEVEL MAY AFFECT RESULT   ALT 28 0 - 53 U/L   Alkaline Phosphatase 78 39 - 117 U/L   Total Bilirubin 0.3 0.3 - 1.2 mg/dL   GFR calc non Af Amer >90 >90 mL/min   GFR calc Af Amer >90 >90 mL/min    Comment: (NOTE) The eGFR has been calculated using the CKD EPI equation. This calculation has not been validated in all clinical situations. eGFR's persistently <90 mL/min signify possible Chronic Kidney Disease.    Anion gap 17 (H) 5 - 15  Protime-INR     Status: None   Collection Time: 12/06/13  2:08 PM  Result Value Ref Range   Prothrombin Time 14.8 11.6 - 15.2 seconds   INR 1.14 0.00 - 1.49  CBC WITH DIFFERENTIAL     Status: Abnormal   Collection Time: 12/06/13  2:08 PM  Result Value Ref Range   WBC 15.7 (H) 4.0 - 10.5 K/uL   RBC 4.50 4.22 - 5.81 MIL/uL   Hemoglobin 14.0 13.0 - 17.0 g/dL   HCT 42.0 39.0 - 52.0 %   MCV 93.3 78.0 - 100.0 fL   MCH 31.1  26.0 - 34.0 pg   MCHC 33.3 30.0 - 36.0 g/dL   RDW 12.7 11.5 - 15.5 %   Platelets 236 150 - 400 K/uL   Neutrophils Relative % 70 43 - 77 %   Neutro Abs 10.9 (H) 1.7 - 7.7 K/uL   Lymphocytes Relative 19 12 - 46 %   Lymphs Abs 3.0 0.7 - 4.0 K/uL   Monocytes Relative 7 3 - 12 %   Monocytes Absolute 1.2 (H) 0.1 - 1.0 K/uL   Eosinophils Relative 4 0 - 5 %   Eosinophils Absolute 0.6 0.0 - 0.7 K/uL   Basophils Relative 0 0 - 1 %   Basophils Absolute 0.0 0.0 - 0.1 K/uL  Ethanol     Status: None   Collection Time: 12/06/13  2:08 PM  Result Value Ref Range   Alcohol, Ethyl (B) <11 0 - 11 mg/dL    Comment:        LOWEST DETECTABLE LIMIT FOR SERUM ALCOHOL IS 11 mg/dL FOR MEDICAL PURPOSES ONLY   Type and screen     Status: None   Collection Time: 12/06/13  2:08 PM  Result Value Ref Range   ABO/RH(D) A POS    Antibody Screen NEG    Sample Expiration 12/09/2013   ABO/Rh     Status: None   Collection Time: 12/06/13  2:08 PM  Result Value Ref Range   ABO/RH(D) A POS     Ct Angio Head W/cm &/or Wo Cm  12/06/2013    CLINICAL DATA:  Hit by forklift. Skull fractures. Rule out arterial injury.  EXAM: CT ANGIOGRAPHY HEAD AND NECK  TECHNIQUE: Multidetector CT imaging of the head and neck was performed using the standard protocol during bolus administration of intravenous contrast. Multiplanar CT image reconstructions and MIPs were obtained to evaluate the vascular anatomy. Carotid stenosis measurements (when applicable) are obtained utilizing NASCET criteria, using the distal internal carotid diameter as the denominator.  CONTRAST:  70m OMNIPAQUE IOHEXOL 350 MG/ML SOLN  COMPARISON:  CT the head and cervical spine earlier today  FINDINGS: CTA HEAD FINDINGS  Ventricle size is normal. No intracranial hemorrhage. There is pneumocephalus with gas in the right cavernous sinus and between the clivus and basilar and suprasellar cistern. This is compatible with basilar skull fractures. Bilateral mastoid sinus fractures are noted. There is gas throughout the deep soft tissues of the neck presumably related to the skullbase fractures. Air-fluid levels in the right maxillary and sphenoid sinuses.  Both vertebral arteries are normal. The basilar is normal. Cerebellar and posterior cerebral arteries are normal.  Cavernous carotid is normal bilaterally. No evidence of carotid dissection or injury. Petrous segment is normal. Anterior and middle cerebral arteries are normal. Negative for aneurysm.  Review of the MIP images confirms the above findings.  CTA NECK FINDINGS  Proximal great vessels are normal. No mass or adenopathy in the neck.  Carotid artery is normal bilaterally. Negative for carotid injury. No atherosclerotic disease or dissection.  Both vertebral arteries are equal in size and widely patent. No significant atherosclerotic disease or dissection.  Review of the MIP images confirms the above findings.  IMPRESSION: Negative for arterial injury of the carotid or basilar artery bilaterally.  Skullbase fractures with pneumocephalus.    Electronically Signed   By: CFranchot GalloM.D.   On: 12/06/2013 18:13   Ct Head Wo Contrast  12/06/2013   CLINICAL DATA:  Patient was pinned between a forklift and rack at work, difficult hearing in both ears,  lacerations to LEFT face and LEFT eye  EXAM: CT HEAD WITHOUT CONTRAST  CT MAXILLOFACIAL WITHOUT CONTRAST  CT CERVICAL SPINE WITHOUT CONTRAST  TECHNIQUE: Multidetector CT imaging of the head, cervical spine, and maxillofacial structures were performed using the standard protocol without intravenous contrast. Multiplanar CT image reconstructions of the cervical spine and maxillofacial structures were also generated. RIGHT-side of face marked with a BB.  COMPARISON:  None.  FINDINGS: CT HEAD FINDINGS  Normal ventricular morphology.  No midline shift or mass effect.  Normal appearance of brain parenchyma.  No intracranial hemorrhage, mass lesion or evidence acute infarction.  Air present at the cavernous sinuses bilaterally, in the sella turcica, and suprasellar compatible with basilar skull fracture.  Partially opacified mastoid air cells bilaterally.  Air-fluid levels in RIGHT maxillary and sphenoid sinuses with partial ethmoid opacification bilaterally.  No calvarial fracture identified.  CT MAXILLOFACIAL FINDINGS  Air present within sella turcica, cavernous sinuses and suprasellar region.  Air-fluid level/hemorrhage within RIGHT sphenoid sinus and RIGHT maxillary sinus.  Partial opacification of ethmoid air cells bilaterally.  Small mucosal retention cyst LEFT maxillary sinus.  Frontal sinus and LEFT maxillary sinus otherwise clear.  Partial opacification of mastoid air cells bilaterally.  Alignment of the LEFT ossicles grossly normal.  Disruption of RIGHT ossicular chain with displacement of the malleus.  RIGHT temporal bone fracture extends to the RIGHT sphenoid across the lateral and anterior walls of the RIGHT sphenoid sinus.  No definite fractures of the orbits, maxillary sinuses or zygomas.  Soft  tissue gas identified anterior and inferior to the mastoid air cells bilaterally and tracking inferiorly into the parapharyngeal spaces.  Gas is seen near the courses of the internal carotid arteries bilaterally at the carotid siphons but no discrete fracture planes involving the siphons are visualized.  Intraorbital soft tissue planes clear.  CT CERVICAL SPINE FINDINGS  Partial BILATERAL mastoid air cell opacification.  Prevertebral soft tissues normal thickness.  Minimal scattered disc space narrowing and endplate spur formation.  Vertebral body heights maintained.  No acute cervical spine fracture, subluxation, or bone destruction.  Lung apices clear.  IMPRESSION: No acute intracranial abnormalities.  Degenerative disc disease changes of the cervical spine.  No acute cervical spine abnormalities.  BILATERAL temporal bone fractures with partial opacification of the mastoid air cells bilaterally.  Disruption of the RIGHT ossicular chain.  Extension of RIGHT temporal fracture into the RIGHT sphenoid bone and traversing the RIGHT sphenoid sinus.  Though no definite involvement of the carotid canals is seen bilaterally, fracture planes particularly on the RIGHT are in close proximity to the carotid canals and if there is clinical concern for internal carotid injury or presence of neurologic impairment then consider CTA head to assess the internal carotid arteries at the skullbase.  Associated soft tissue gas caudal to the skullbase as well as intracranial gas at the cavernous sinuses, sella turcica and suprasellar regions.  Findings called to Dr. Jacelyn Grip on 12/06/2013 at 1650 hr.   Electronically Signed   By: Lavonia Dana M.D.   On: 12/06/2013 16:52   Ct Angio Neck W/cm &/or Wo/cm  12/06/2013   CLINICAL DATA:  Hit by forklift. Skull fractures. Rule out arterial injury.  EXAM: CT ANGIOGRAPHY HEAD AND NECK  TECHNIQUE: Multidetector CT imaging of the head and neck was performed using the standard protocol during bolus  administration of intravenous contrast. Multiplanar CT image reconstructions and MIPs were obtained to evaluate the vascular anatomy. Carotid stenosis measurements (when applicable) are obtained utilizing NASCET criteria,  using the distal internal carotid diameter as the denominator.  CONTRAST:  61m OMNIPAQUE IOHEXOL 350 MG/ML SOLN  COMPARISON:  CT the head and cervical spine earlier today  FINDINGS: CTA HEAD FINDINGS  Ventricle size is normal. No intracranial hemorrhage. There is pneumocephalus with gas in the right cavernous sinus and between the clivus and basilar and suprasellar cistern. This is compatible with basilar skull fractures. Bilateral mastoid sinus fractures are noted. There is gas throughout the deep soft tissues of the neck presumably related to the skullbase fractures. Air-fluid levels in the right maxillary and sphenoid sinuses.  Both vertebral arteries are normal. The basilar is normal. Cerebellar and posterior cerebral arteries are normal.  Cavernous carotid is normal bilaterally. No evidence of carotid dissection or injury. Petrous segment is normal. Anterior and middle cerebral arteries are normal. Negative for aneurysm.  Review of the MIP images confirms the above findings.  CTA NECK FINDINGS  Proximal great vessels are normal. No mass or adenopathy in the neck.  Carotid artery is normal bilaterally. Negative for carotid injury. No atherosclerotic disease or dissection.  Both vertebral arteries are equal in size and widely patent. No significant atherosclerotic disease or dissection.  Review of the MIP images confirms the above findings.  IMPRESSION: Negative for arterial injury of the carotid or basilar artery bilaterally.  Skullbase fractures with pneumocephalus.   Electronically Signed   By: CFranchot GalloM.D.   On: 12/06/2013 18:13   Ct Chest W Contrast  12/06/2013   CLINICAL DATA:  Trauma. Tenth between 4 clip in and around back. Severe right lower quadrant pain. Right arm limited  range of motion.  EXAM: CT CHEST, ABDOMEN, AND PELVIS WITH CONTRAST  TECHNIQUE: Multidetector CT imaging of the chest, abdomen and pelvis was performed following the standard protocol during bolus administration of intravenous contrast.  CONTRAST:  90 cc Omnipaque 300 intravenous  COMPARISON:  None.  FINDINGS: CT CHEST FINDINGS  THORACIC INLET/BODY WALL:  No acute abnormality.  MEDIASTINUM:  Normal heart size. No pericardial effusion. No acute vascular abnormality. No adenopathy.  LUNG WINDOWS:  No contusion, hemothorax, or pneumothorax. Mild scattered atelectasis. Incidental air trapping in the apical left upper lobe; noted history of asthma.  OSSEOUS:  See below  CT ABDOMEN AND PELVIS FINDINGS  BODY WALL: Subcutaneous reticulation in the right lower lumbar region consistent with contusion.  Liver: No focal abnormality.  Biliary: No evidence of biliary obstruction or stone.  Pancreas: Unremarkable.  Spleen: Unremarkable.  Adrenals: Unremarkable.  Kidneys and ureters: No evidence of injury.  Bladder: Unremarkable.  Reproductive: Unremarkable.  Bowel: No evidence of injury. Extensive distal colonic diverticulosis.  Retroperitoneum: No mass or adenopathy.  Peritoneum: No free fluid or gas.  Vascular: No acute findings.  OSSEOUS: No findings to explain right shoulder symptoms. Note that the upper scapula, humerus, and distal clavicle is not visualized.  L1 through L4 right transverse process fractures with up to moderate displacement/distraction. There is associated hemorrhage within the right quadratus lumborum and neighboring deep back muscles. No measurable hematoma or evidence of active hemorrhage.  IMPRESSION: 1. L1 through L4 right transverse process fractures. 2. No acute intrathoracic or intra-abdominal findings. 3. Extensive colonic diverticulosis.   Electronically Signed   By: JJorje GuildM.D.   On: 12/06/2013 16:04   Ct Cervical Spine Wo Contrast  12/06/2013   CLINICAL DATA:  Patient was pinned  between a forklift and rack at work, difficult hearing in both ears, lacerations to LEFT face and LEFT eye  EXAM:  CT HEAD WITHOUT CONTRAST  CT MAXILLOFACIAL WITHOUT CONTRAST  CT CERVICAL SPINE WITHOUT CONTRAST  TECHNIQUE: Multidetector CT imaging of the head, cervical spine, and maxillofacial structures were performed using the standard protocol without intravenous contrast. Multiplanar CT image reconstructions of the cervical spine and maxillofacial structures were also generated. RIGHT-side of face marked with a BB.  COMPARISON:  None.  FINDINGS: CT HEAD FINDINGS  Normal ventricular morphology.  No midline shift or mass effect.  Normal appearance of brain parenchyma.  No intracranial hemorrhage, mass lesion or evidence acute infarction.  Air present at the cavernous sinuses bilaterally, in the sella turcica, and suprasellar compatible with basilar skull fracture.  Partially opacified mastoid air cells bilaterally.  Air-fluid levels in RIGHT maxillary and sphenoid sinuses with partial ethmoid opacification bilaterally.  No calvarial fracture identified.  CT MAXILLOFACIAL FINDINGS  Air present within sella turcica, cavernous sinuses and suprasellar region.  Air-fluid level/hemorrhage within RIGHT sphenoid sinus and RIGHT maxillary sinus.  Partial opacification of ethmoid air cells bilaterally.  Small mucosal retention cyst LEFT maxillary sinus.  Frontal sinus and LEFT maxillary sinus otherwise clear.  Partial opacification of mastoid air cells bilaterally.  Alignment of the LEFT ossicles grossly normal.  Disruption of RIGHT ossicular chain with displacement of the malleus.  RIGHT temporal bone fracture extends to the RIGHT sphenoid across the lateral and anterior walls of the RIGHT sphenoid sinus.  No definite fractures of the orbits, maxillary sinuses or zygomas.  Soft tissue gas identified anterior and inferior to the mastoid air cells bilaterally and tracking inferiorly into the parapharyngeal spaces.  Gas is  seen near the courses of the internal carotid arteries bilaterally at the carotid siphons but no discrete fracture planes involving the siphons are visualized.  Intraorbital soft tissue planes clear.  CT CERVICAL SPINE FINDINGS  Partial BILATERAL mastoid air cell opacification.  Prevertebral soft tissues normal thickness.  Minimal scattered disc space narrowing and endplate spur formation.  Vertebral body heights maintained.  No acute cervical spine fracture, subluxation, or bone destruction.  Lung apices clear.  IMPRESSION: No acute intracranial abnormalities.  Degenerative disc disease changes of the cervical spine.  No acute cervical spine abnormalities.  BILATERAL temporal bone fractures with partial opacification of the mastoid air cells bilaterally.  Disruption of the RIGHT ossicular chain.  Extension of RIGHT temporal fracture into the RIGHT sphenoid bone and traversing the RIGHT sphenoid sinus.  Though no definite involvement of the carotid canals is seen bilaterally, fracture planes particularly on the RIGHT are in close proximity to the carotid canals and if there is clinical concern for internal carotid injury or presence of neurologic impairment then consider CTA head to assess the internal carotid arteries at the skullbase.  Associated soft tissue gas caudal to the skullbase as well as intracranial gas at the cavernous sinuses, sella turcica and suprasellar regions.  Findings called to Dr. Jacelyn Grip on 12/06/2013 at 1650 hr.   Electronically Signed   By: Lavonia Dana M.D.   On: 12/06/2013 16:52   Ct Abdomen Pelvis W Contrast  12/06/2013   CLINICAL DATA:  Trauma. Tenth between 4 clip in and around back. Severe right lower quadrant pain. Right arm limited range of motion.  EXAM: CT CHEST, ABDOMEN, AND PELVIS WITH CONTRAST  TECHNIQUE: Multidetector CT imaging of the chest, abdomen and pelvis was performed following the standard protocol during bolus administration of intravenous contrast.  CONTRAST:  90 cc  Omnipaque 300 intravenous  COMPARISON:  None.  FINDINGS: CT CHEST FINDINGS  THORACIC  INLET/BODY WALL:  No acute abnormality.  MEDIASTINUM:  Normal heart size. No pericardial effusion. No acute vascular abnormality. No adenopathy.  LUNG WINDOWS:  No contusion, hemothorax, or pneumothorax. Mild scattered atelectasis. Incidental air trapping in the apical left upper lobe; noted history of asthma.  OSSEOUS:  See below  CT ABDOMEN AND PELVIS FINDINGS  BODY WALL: Subcutaneous reticulation in the right lower lumbar region consistent with contusion.  Liver: No focal abnormality.  Biliary: No evidence of biliary obstruction or stone.  Pancreas: Unremarkable.  Spleen: Unremarkable.  Adrenals: Unremarkable.  Kidneys and ureters: No evidence of injury.  Bladder: Unremarkable.  Reproductive: Unremarkable.  Bowel: No evidence of injury. Extensive distal colonic diverticulosis.  Retroperitoneum: No mass or adenopathy.  Peritoneum: No free fluid or gas.  Vascular: No acute findings.  OSSEOUS: No findings to explain right shoulder symptoms. Note that the upper scapula, humerus, and distal clavicle is not visualized.  L1 through L4 right transverse process fractures with up to moderate displacement/distraction. There is associated hemorrhage within the right quadratus lumborum and neighboring deep back muscles. No measurable hematoma or evidence of active hemorrhage.  IMPRESSION: 1. L1 through L4 right transverse process fractures. 2. No acute intrathoracic or intra-abdominal findings. 3. Extensive colonic diverticulosis.   Electronically Signed   By: Jorje Guild M.D.   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 Facial trauma.  EXAM: PORTABLE PELVIS 1-2 VIEWS  COMPARISON:  None.  FINDINGS: There is no evidence of pelvic fracture or dislocation. There are mild degenerative joint changes with narrowed bilateral hip  joint spaces.  IMPRESSION: No acute fracture or dislocation.   Electronically Signed   By: Abelardo Diesel M.D.   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 Facial trauma Left eyelid, missing tooth top left and Right ear. LOC after intial accident, back at baseline. Back pain.  EXAM: PORTABLE CHEST - 1 VIEW  COMPARISON:  None.  FINDINGS: The heart size and mediastinal contours are within normal limits. Both lungs are clear. The visualized skeletal structures are unremarkable.  IMPRESSION: No active disease.   Electronically Signed   By: Kathreen Devoid   On: 12/06/2013 15:00   Ct Maxillofacial Wo Cm  12/06/2013   CLINICAL DATA:  Patient was pinned between a forklift and rack at work, difficult hearing in both ears, lacerations to LEFT face and LEFT eye  EXAM: CT HEAD WITHOUT CONTRAST  CT MAXILLOFACIAL WITHOUT CONTRAST  CT CERVICAL SPINE WITHOUT CONTRAST  TECHNIQUE: Multidetector CT imaging of the head, cervical spine, and maxillofacial structures were performed using the standard protocol without intravenous contrast. Multiplanar CT image reconstructions of the cervical spine and maxillofacial structures were also generated. RIGHT-side of face marked with a BB.  COMPARISON:  None.  FINDINGS: CT HEAD FINDINGS  Normal ventricular morphology.  No midline shift or mass effect.  Normal appearance of brain parenchyma.  No intracranial hemorrhage, mass lesion or evidence acute infarction.  Air present at the cavernous sinuses bilaterally, in the sella turcica, and suprasellar compatible with basilar skull fracture.  Partially opacified mastoid air cells bilaterally.  Air-fluid levels in RIGHT maxillary and sphenoid sinuses with partial ethmoid opacification bilaterally.  No calvarial fracture identified.  CT MAXILLOFACIAL FINDINGS  Air present within sella  turcica, cavernous sinuses and suprasellar region.   Air-fluid level/hemorrhage within RIGHT sphenoid sinus and RIGHT maxillary sinus.  Partial opacification of ethmoid air cells bilaterally.  Small mucosal retention cyst LEFT maxillary sinus.  Frontal sinus and LEFT maxillary sinus otherwise clear.  Partial opacification of mastoid air cells bilaterally.  Alignment of the LEFT ossicles grossly normal.  Disruption of RIGHT ossicular chain with displacement of the malleus.  RIGHT temporal bone fracture extends to the RIGHT sphenoid across the lateral and anterior walls of the RIGHT sphenoid sinus.  No definite fractures of the orbits, maxillary sinuses or zygomas.  Soft tissue gas identified anterior and inferior to the mastoid air cells bilaterally and tracking inferiorly into the parapharyngeal spaces.  Gas is seen near the courses of the internal carotid arteries bilaterally at the carotid siphons but no discrete fracture planes involving the siphons are visualized.  Intraorbital soft tissue planes clear.  CT CERVICAL SPINE FINDINGS  Partial BILATERAL mastoid air cell opacification.  Prevertebral soft tissues normal thickness.  Minimal scattered disc space narrowing and endplate spur formation.  Vertebral body heights maintained.  No acute cervical spine fracture, subluxation, or bone destruction.  Lung apices clear.  IMPRESSION: No acute intracranial abnormalities.  Degenerative disc disease changes of the cervical spine.  No acute cervical spine abnormalities.  BILATERAL temporal bone fractures with partial opacification of the mastoid air cells bilaterally.  Disruption of the RIGHT ossicular chain.  Extension of RIGHT temporal fracture into the RIGHT sphenoid bone and traversing the RIGHT sphenoid sinus.  Though no definite involvement of the carotid canals is seen bilaterally, fracture planes particularly on the RIGHT are in close proximity to the carotid canals and if there is clinical concern for internal carotid injury or presence of neurologic impairment  then consider CTA head to assess the internal carotid arteries at the skullbase.  Associated soft tissue gas caudal to the skullbase as well as intracranial gas at the cavernous sinuses, sella turcica and suprasellar regions.  Findings called to Dr. Jacelyn Grip on 12/06/2013 at 1650 hr.   Electronically Signed   By: Lavonia Dana M.D.   On: 12/06/2013 16:52    Review of Systems  Constitutional: Negative.   Eyes: Negative.   Respiratory: Negative.   Cardiovascular: Negative.   Genitourinary: Negative.   Musculoskeletal: Negative.   Skin: Negative.   Psychiatric/Behavioral: Negative.    Blood pressure 111/66, pulse 83, temperature 97.4 F (36.3 C), temperature source Axillary, resp. rate 11, SpO2 97 %. Physical Exam  Constitutional: He appears well-developed.  Respiratory: Effort normal.  GI: Soft.  Neurological: He is alert.  Skin: Skin is warm.  Psychiatric: He has a normal mood and affect.    Assessment/Plan: The ear and eyelid were repaired and have recommended an ENT consultation for evaluation of the hearing issues.  SANGER,CLAIRE 12/06/2013, 6:34 PM

## 2013-12-06 NOTE — ED Notes (Signed)
This RN spoke to Dr. Janee Mornhompson and he states that pt can have head of bed elevated.

## 2013-12-06 NOTE — H&P (Signed)
Michael Hooper is an 53 y.o. male.   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 After releasing his load, the forklift kicked back and pinned him against a large rack. He had loss of consciousness at the scene. He regained consciousness and was transported to the hospital where he underwent evaluation by the emergency department physician. He was found to have skull base fractures with pneumocephalus, right facial paralysis, and lumbar transverse process fractures. We are asked to admit him to the trauma service. He is arousable but family does assist some with the history.  Past Medical History  Diagnosis Date  . Asthma     History reviewed. No pertinent past surgical history.  Family History  Problem Relation Age of Onset  . Heart failure Mother   . Heart failure Father   . Asthma Brother    Social History:  reports that he has never smoked. He does not have any smokeless tobacco history on file. He reports that he drinks about 2.4 oz of alcohol per week. He reports that he does not use illicit drugs.  Allergies: No Known Allergies   (Not in a hospital admission)  Results for orders placed or performed during the hospital encounter of 12/06/13 (from the past 48 hour(s))  Comprehensive metabolic panel     Status: Abnormal   Collection Time: 12/06/13  2:08 PM  Result Value Ref Range   Sodium 141 137 - 147 mEq/L   Potassium 3.3 (L) 3.7 - 5.3 mEq/L   Chloride 100 96 - 112 mEq/L   CO2 24 19 - 32 mEq/L   Glucose, Bld 125 (H) 70 - 99 mg/dL   BUN 15 6 - 23 mg/dL   Creatinine, Ser 0.96 0.50 - 1.35 mg/dL   Calcium 8.7 8.4 - 10.5 mg/dL   Total Protein 7.1 6.0 - 8.3 g/dL   Albumin 4.0 3.5 - 5.2 g/dL   AST 28 0 - 37 U/L    Comment: HEMOLYSIS AT THIS LEVEL MAY AFFECT RESULT   ALT 28 0 - 53 U/L   Alkaline Phosphatase 78 39 - 117 U/L   Total Bilirubin 0.3 0.3 - 1.2 mg/dL   GFR calc non Af Amer >90 >90  mL/min   GFR calc Af Amer >90 >90 mL/min    Comment: (NOTE) The eGFR has been calculated using the CKD EPI equation. This calculation has not been validated in all clinical situations. eGFR's persistently <90 mL/min signify possible Chronic Kidney Disease.    Anion gap 17 (H) 5 - 15  Protime-INR     Status: None   Collection Time: 12/06/13  2:08 PM  Result Value Ref Range   Prothrombin Time 14.8 11.6 - 15.2 seconds   INR 1.14 0.00 - 1.49  CBC WITH DIFFERENTIAL     Status: Abnormal   Collection Time: 12/06/13  2:08 PM  Result Value Ref Range   WBC 15.7 (H) 4.0 - 10.5 K/uL   RBC 4.50 4.22 - 5.81 MIL/uL   Hemoglobin 14.0 13.0 - 17.0 g/dL   HCT 42.0 39.0 - 52.0 %   MCV 93.3 78.0 - 100.0 fL   MCH 31.1 26.0 - 34.0 pg   MCHC 33.3 30.0 - 36.0 g/dL   RDW 12.7 11.5 - 15.5 %   Platelets 236 150 - 400 K/uL   Neutrophils Relative % 70 43 - 77 %   Neutro Abs 10.9 (H) 1.7 - 7.7 K/uL   Lymphocytes  Relative 19 12 - 46 %   Lymphs Abs 3.0 0.7 - 4.0 K/uL   Monocytes Relative 7 3 - 12 %   Monocytes Absolute 1.2 (H) 0.1 - 1.0 K/uL   Eosinophils Relative 4 0 - 5 %   Eosinophils Absolute 0.6 0.0 - 0.7 K/uL   Basophils Relative 0 0 - 1 %   Basophils Absolute 0.0 0.0 - 0.1 K/uL  Ethanol     Status: None   Collection Time: 12/06/13  2:08 PM  Result Value Ref Range   Alcohol, Ethyl (B) <11 0 - 11 mg/dL    Comment:        LOWEST DETECTABLE LIMIT FOR SERUM ALCOHOL IS 11 mg/dL FOR MEDICAL PURPOSES ONLY   Type and screen     Status: None   Collection Time: 12/06/13  2:08 PM  Result Value Ref Range   ABO/RH(D) A POS    Antibody Screen NEG    Sample Expiration 12/09/2013   ABO/Rh     Status: None   Collection Time: 12/06/13  2:08 PM  Result Value Ref Range   ABO/RH(D) A POS    Ct Head Wo Contrast  12/06/2013   CLINICAL DATA:  Patient was pinned between a forklift and rack at work, difficult hearing in both ears, lacerations to LEFT face and LEFT eye  EXAM: CT HEAD WITHOUT CONTRAST  CT  MAXILLOFACIAL WITHOUT CONTRAST  CT CERVICAL SPINE WITHOUT CONTRAST  TECHNIQUE: Multidetector CT imaging of the head, cervical spine, and maxillofacial structures were performed using the standard protocol without intravenous contrast. Multiplanar CT image reconstructions of the cervical spine and maxillofacial structures were also generated. RIGHT-side of face marked with a BB.  COMPARISON:  None.  FINDINGS: CT HEAD FINDINGS  Normal ventricular morphology.  No midline shift or mass effect.  Normal appearance of brain parenchyma.  No intracranial hemorrhage, mass lesion or evidence acute infarction.  Air present at the cavernous sinuses bilaterally, in the sella turcica, and suprasellar compatible with basilar skull fracture.  Partially opacified mastoid air cells bilaterally.  Air-fluid levels in RIGHT maxillary and sphenoid sinuses with partial ethmoid opacification bilaterally.  No calvarial fracture identified.  CT MAXILLOFACIAL FINDINGS  Air present within sella turcica, cavernous sinuses and suprasellar region.  Air-fluid level/hemorrhage within RIGHT sphenoid sinus and RIGHT maxillary sinus.  Partial opacification of ethmoid air cells bilaterally.  Small mucosal retention cyst LEFT maxillary sinus.  Frontal sinus and LEFT maxillary sinus otherwise clear.  Partial opacification of mastoid air cells bilaterally.  Alignment of the LEFT ossicles grossly normal.  Disruption of RIGHT ossicular chain with displacement of the malleus.  RIGHT temporal bone fracture extends to the RIGHT sphenoid across the lateral and anterior walls of the RIGHT sphenoid sinus.  No definite fractures of the orbits, maxillary sinuses or zygomas.  Soft tissue gas identified anterior and inferior to the mastoid air cells bilaterally and tracking inferiorly into the parapharyngeal spaces.  Gas is seen near the courses of the internal carotid arteries bilaterally at the carotid siphons but no discrete fracture planes involving the siphons  are visualized.  Intraorbital soft tissue planes clear.  CT CERVICAL SPINE FINDINGS  Partial BILATERAL mastoid air cell opacification.  Prevertebral soft tissues normal thickness.  Minimal scattered disc space narrowing and endplate spur formation.  Vertebral body heights maintained.  No acute cervical spine fracture, subluxation, or bone destruction.  Lung apices clear.  IMPRESSION: No acute intracranial abnormalities.  Degenerative disc disease changes of the cervical spine.  No  acute cervical spine abnormalities.  BILATERAL temporal bone fractures with partial opacification of the mastoid air cells bilaterally.  Disruption of the RIGHT ossicular chain.  Extension of RIGHT temporal fracture into the RIGHT sphenoid bone and traversing the RIGHT sphenoid sinus.  Though no definite involvement of the carotid canals is seen bilaterally, fracture planes particularly on the RIGHT are in close proximity to the carotid canals and if there is clinical concern for internal carotid injury or presence of neurologic impairment then consider CTA head to assess the internal carotid arteries at the skullbase.  Associated soft tissue gas caudal to the skullbase as well as intracranial gas at the cavernous sinuses, sella turcica and suprasellar regions.  Findings called to Dr. Jacelyn Grip on 12/06/2013 at 1650 hr.   Electronically Signed   By: Lavonia Dana M.D.   On: 12/06/2013 16:52   Ct Chest W Contrast  12/06/2013   CLINICAL DATA:  Trauma. Tenth between 4 clip in and around back. Severe right lower quadrant pain. Right arm limited range of motion.  EXAM: CT CHEST, ABDOMEN, AND PELVIS WITH CONTRAST  TECHNIQUE: Multidetector CT imaging of the chest, abdomen and pelvis was performed following the standard protocol during bolus administration of intravenous contrast.  CONTRAST:  90 cc Omnipaque 300 intravenous  COMPARISON:  None.  FINDINGS: CT CHEST FINDINGS  THORACIC INLET/BODY WALL:  No acute abnormality.  MEDIASTINUM:  Normal heart  size. No pericardial effusion. No acute vascular abnormality. No adenopathy.  LUNG WINDOWS:  No contusion, hemothorax, or pneumothorax. Mild scattered atelectasis. Incidental air trapping in the apical left upper lobe; noted history of asthma.  OSSEOUS:  See below  CT ABDOMEN AND PELVIS FINDINGS  BODY WALL: Subcutaneous reticulation in the right lower lumbar region consistent with contusion.  Liver: No focal abnormality.  Biliary: No evidence of biliary obstruction or stone.  Pancreas: Unremarkable.  Spleen: Unremarkable.  Adrenals: Unremarkable.  Kidneys and ureters: No evidence of injury.  Bladder: Unremarkable.  Reproductive: Unremarkable.  Bowel: No evidence of injury. Extensive distal colonic diverticulosis.  Retroperitoneum: No mass or adenopathy.  Peritoneum: No free fluid or gas.  Vascular: No acute findings.  OSSEOUS: No findings to explain right shoulder symptoms. Note that the upper scapula, humerus, and distal clavicle is not visualized.  L1 through L4 right transverse process fractures with up to moderate displacement/distraction. There is associated hemorrhage within the right quadratus lumborum and neighboring deep back muscles. No measurable hematoma or evidence of active hemorrhage.  IMPRESSION: 1. L1 through L4 right transverse process fractures. 2. No acute intrathoracic or intra-abdominal findings. 3. Extensive colonic diverticulosis.   Electronically Signed   By: Jorje Guild M.D.   On: 12/06/2013 16:04   Ct Cervical Spine Wo Contrast  12/06/2013   CLINICAL DATA:  Patient was pinned between a forklift and rack at work, difficult hearing in both ears, lacerations to LEFT face and LEFT eye  EXAM: CT HEAD WITHOUT CONTRAST  CT MAXILLOFACIAL WITHOUT CONTRAST  CT CERVICAL SPINE WITHOUT CONTRAST  TECHNIQUE: Multidetector CT imaging of the head, cervical spine, and maxillofacial structures were performed using the standard protocol without intravenous contrast. Multiplanar CT image reconstructions  of the cervical spine and maxillofacial structures were also generated. RIGHT-side of face marked with a BB.  COMPARISON:  None.  FINDINGS: CT HEAD FINDINGS  Normal ventricular morphology.  No midline shift or mass effect.  Normal appearance of brain parenchyma.  No intracranial hemorrhage, mass lesion or evidence acute infarction.  Air present at the cavernous sinuses  bilaterally, in the sella turcica, and suprasellar compatible with basilar skull fracture.  Partially opacified mastoid air cells bilaterally.  Air-fluid levels in RIGHT maxillary and sphenoid sinuses with partial ethmoid opacification bilaterally.  No calvarial fracture identified.  CT MAXILLOFACIAL FINDINGS  Air present within sella turcica, cavernous sinuses and suprasellar region.  Air-fluid level/hemorrhage within RIGHT sphenoid sinus and RIGHT maxillary sinus.  Partial opacification of ethmoid air cells bilaterally.  Small mucosal retention cyst LEFT maxillary sinus.  Frontal sinus and LEFT maxillary sinus otherwise clear.  Partial opacification of mastoid air cells bilaterally.  Alignment of the LEFT ossicles grossly normal.  Disruption of RIGHT ossicular chain with displacement of the malleus.  RIGHT temporal bone fracture extends to the RIGHT sphenoid across the lateral and anterior walls of the RIGHT sphenoid sinus.  No definite fractures of the orbits, maxillary sinuses or zygomas.  Soft tissue gas identified anterior and inferior to the mastoid air cells bilaterally and tracking inferiorly into the parapharyngeal spaces.  Gas is seen near the courses of the internal carotid arteries bilaterally at the carotid siphons but no discrete fracture planes involving the siphons are visualized.  Intraorbital soft tissue planes clear.  CT CERVICAL SPINE FINDINGS  Partial BILATERAL mastoid air cell opacification.  Prevertebral soft tissues normal thickness.  Minimal scattered disc space narrowing and endplate spur formation.  Vertebral body heights  maintained.  No acute cervical spine fracture, subluxation, or bone destruction.  Lung apices clear.  IMPRESSION: No acute intracranial abnormalities.  Degenerative disc disease changes of the cervical spine.  No acute cervical spine abnormalities.  BILATERAL temporal bone fractures with partial opacification of the mastoid air cells bilaterally.  Disruption of the RIGHT ossicular chain.  Extension of RIGHT temporal fracture into the RIGHT sphenoid bone and traversing the RIGHT sphenoid sinus.  Though no definite involvement of the carotid canals is seen bilaterally, fracture planes particularly on the RIGHT are in close proximity to the carotid canals and if there is clinical concern for internal carotid injury or presence of neurologic impairment then consider CTA head to assess the internal carotid arteries at the skullbase.  Associated soft tissue gas caudal to the skullbase as well as intracranial gas at the cavernous sinuses, sella turcica and suprasellar regions.  Findings called to Dr. Jacelyn Grip on 12/06/2013 at 1650 hr.   Electronically Signed   By: Lavonia Dana M.D.   On: 12/06/2013 16:52   Ct Abdomen Pelvis W Contrast  12/06/2013   CLINICAL DATA:  Trauma. Tenth between 4 clip in and around back. Severe right lower quadrant pain. Right arm limited range of motion.  EXAM: CT CHEST, ABDOMEN, AND PELVIS WITH CONTRAST  TECHNIQUE: Multidetector CT imaging of the chest, abdomen and pelvis was performed following the standard protocol during bolus administration of intravenous contrast.  CONTRAST:  90 cc Omnipaque 300 intravenous  COMPARISON:  None.  FINDINGS: CT CHEST FINDINGS  THORACIC INLET/BODY WALL:  No acute abnormality.  MEDIASTINUM:  Normal heart size. No pericardial effusion. No acute vascular abnormality. No adenopathy.  LUNG WINDOWS:  No contusion, hemothorax, or pneumothorax. Mild scattered atelectasis. Incidental air trapping in the apical left upper lobe; noted history of asthma.  OSSEOUS:  See below   CT ABDOMEN AND PELVIS FINDINGS  BODY WALL: Subcutaneous reticulation in the right lower lumbar region consistent with contusion.  Liver: No focal abnormality.  Biliary: No evidence of biliary obstruction or stone.  Pancreas: Unremarkable.  Spleen: Unremarkable.  Adrenals: Unremarkable.  Kidneys and ureters: No evidence of  injury.  Bladder: Unremarkable.  Reproductive: Unremarkable.  Bowel: No evidence of injury. Extensive distal colonic diverticulosis.  Retroperitoneum: No mass or adenopathy.  Peritoneum: No free fluid or gas.  Vascular: No acute findings.  OSSEOUS: No findings to explain right shoulder symptoms. Note that the upper scapula, humerus, and distal clavicle is not visualized.  L1 through L4 right transverse process fractures with up to moderate displacement/distraction. There is associated hemorrhage within the right quadratus lumborum and neighboring deep back muscles. No measurable hematoma or evidence of active hemorrhage.  IMPRESSION: 1. L1 through L4 right transverse process fractures. 2. No acute intrathoracic or intra-abdominal findings. 3. Extensive colonic diverticulosis.   Electronically Signed   By: Jorje Guild M.D.   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 Facial trauma.  EXAM: PORTABLE PELVIS 1-2 VIEWS  COMPARISON:  None.  FINDINGS: There is no evidence of pelvic fracture or dislocation. There are mild degenerative joint changes with narrowed bilateral hip joint spaces.  IMPRESSION: No acute fracture or dislocation.   Electronically Signed   By: Abelardo Diesel M.D.   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 Facial trauma Left eyelid, missing tooth top left and Right ear. LOC after intial accident, back at baseline. Back pain.  EXAM: PORTABLE CHEST - 1  VIEW  COMPARISON:  None.  FINDINGS: The heart size and mediastinal contours are within normal limits. Both lungs are clear. The visualized skeletal structures are unremarkable.  IMPRESSION: No active disease.   Electronically Signed   By: Kathreen Devoid   On: 12/06/2013 15:00   Ct Maxillofacial Wo Cm  12/06/2013   CLINICAL DATA:  Patient was pinned between a forklift and rack at work, difficult hearing in both ears, lacerations to LEFT face and LEFT eye  EXAM: CT HEAD WITHOUT CONTRAST  CT MAXILLOFACIAL WITHOUT CONTRAST  CT CERVICAL SPINE WITHOUT CONTRAST  TECHNIQUE: Multidetector CT imaging of the head, cervical spine, and maxillofacial structures were performed using the standard protocol without intravenous contrast. Multiplanar CT image reconstructions of the cervical spine and maxillofacial structures were also generated. RIGHT-side of face marked with a BB.  COMPARISON:  None.  FINDINGS: CT HEAD FINDINGS  Normal ventricular morphology.  No midline shift or mass effect.  Normal appearance of brain parenchyma.  No intracranial hemorrhage, mass lesion or evidence acute infarction.  Air present at the cavernous sinuses bilaterally, in the sella turcica, and suprasellar compatible with basilar skull fracture.  Partially opacified mastoid air cells bilaterally.  Air-fluid levels in RIGHT maxillary and sphenoid sinuses with partial ethmoid opacification bilaterally.  No calvarial fracture identified.  CT MAXILLOFACIAL FINDINGS  Air present within sella turcica, cavernous sinuses and suprasellar region.  Air-fluid level/hemorrhage within RIGHT sphenoid sinus and RIGHT maxillary sinus.  Partial opacification of ethmoid air cells bilaterally.  Small mucosal retention cyst LEFT maxillary sinus.  Frontal sinus and LEFT maxillary sinus otherwise clear.  Partial opacification of mastoid air cells bilaterally.  Alignment of the LEFT ossicles grossly normal.  Disruption of RIGHT ossicular chain with displacement of the  malleus.  RIGHT temporal bone fracture extends to the RIGHT sphenoid across the lateral and anterior walls of the RIGHT sphenoid sinus.  No definite fractures of the orbits, maxillary sinuses or  zygomas.  Soft tissue gas identified anterior and inferior to the mastoid air cells bilaterally and tracking inferiorly into the parapharyngeal spaces.  Gas is seen near the courses of the internal carotid arteries bilaterally at the carotid siphons but no discrete fracture planes involving the siphons are visualized.  Intraorbital soft tissue planes clear.  CT CERVICAL SPINE FINDINGS  Partial BILATERAL mastoid air cell opacification.  Prevertebral soft tissues normal thickness.  Minimal scattered disc space narrowing and endplate spur formation.  Vertebral body heights maintained.  No acute cervical spine fracture, subluxation, or bone destruction.  Lung apices clear.  IMPRESSION: No acute intracranial abnormalities.  Degenerative disc disease changes of the cervical spine.  No acute cervical spine abnormalities.  BILATERAL temporal bone fractures with partial opacification of the mastoid air cells bilaterally.  Disruption of the RIGHT ossicular chain.  Extension of RIGHT temporal fracture into the RIGHT sphenoid bone and traversing the RIGHT sphenoid sinus.  Though no definite involvement of the carotid canals is seen bilaterally, fracture planes particularly on the RIGHT are in close proximity to the carotid canals and if there is clinical concern for internal carotid injury or presence of neurologic impairment then consider CTA head to assess the internal carotid arteries at the skullbase.  Associated soft tissue gas caudal to the skullbase as well as intracranial gas at the cavernous sinuses, sella turcica and suprasellar regions.  Findings called to Dr. Jacelyn Grip on 12/06/2013 at 1650 hr.   Electronically Signed   By: Lavonia Dana M.D.   On: 12/06/2013 16:52    Review of Systems  Constitutional: Negative for fever and  chills.  HENT: Positive for hearing loss.        Can't move right side of his mouth  Eyes:       Laceration left eyelid  Respiratory: Negative.   Cardiovascular: Negative.   Gastrointestinal: Negative.   Genitourinary: Negative.   Musculoskeletal:       Right lower back pain  Skin: Negative.   Neurological: Positive for speech change.       Cannot move right side of his mouth, this gives him difficulty speaking, denies numbness or tingling in the extremities  Endo/Heme/Allergies: Negative.   Psychiatric/Behavioral: Negative.     Blood pressure 116/72, pulse 75, temperature 97.4 F (36.3 C), temperature source Axillary, resp. rate 10, SpO2 96 %. Physical Exam  Constitutional: He is oriented to person, place, and time. He appears well-developed and well-nourished. No distress.  HENT:  Right Ear: There is hemotympanum. Decreased hearing is noted.  Left Ear: Hearing, external ear and ear canal normal.  Ears:  Nose: No sinus tenderness or nasal deformity.  Mouth/Throat: Uvula is midline, oropharynx is clear and moist and mucous membranes are normal.  No hearing right ear, linear transverse right ear laceration involving cartilage, cerumen left ear canal  Eyes: EOM are normal. Pupils are equal, round, and reactive to light. Right conjunctiva is injected. Left conjunctiva is injected.    Laceration left eyelid  Cardiovascular: Normal rate, normal heart sounds and intact distal pulses.   Respiratory: Effort normal and breath sounds normal.  GI: Soft. Bowel sounds are normal. He exhibits no distension. There is no tenderness. There is no rebound and no guarding.  Musculoskeletal: Normal range of motion.  Right-sided lumbar spine tenderness without step-off  Neurological: He is alert and oriented to person, place, and time. He has normal strength. He displays no atrophy and no tremor. A cranial nerve deficit is present. He displays no seizure  activity. GCS eye subscore is 4. GCS verbal  subscore is 5. GCS motor subscore is 6.  Right facial motor deficit mostly affecting right side of the mouth, light touch seems intact, otherwise moves all extremities well  Skin: Skin is warm and dry.  Psychiatric: He has a normal mood and affect.     Assessment/Plan Industrial accident Skull base fracture with pneumocephalus, bilateral temporal bone fractures, right sphenoid sinus  fracture, right ossicle of the ear fracture Right ear laceration and left eyelid laceration Right L1-4 transverse process fractures  Plan: admit to trauma/neuro ICU, Dr. Migdalia Dk will see him regarding his ear and eyelid lacerations as well as his skull base fractures. I spoke with Dr. Sherwood Gambler who will see him regarding his pneumocephalus and skull base fractures as well.I spoke with his son and wife regarding our plan. Michael Hooper E 12/06/2013, 5:09 PM

## 2013-12-06 NOTE — ED Notes (Signed)
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 Facial trauma Left eyelid, missing tooth top left and Right ear. LOC after intial accident, back at baseline. Back pain. Pt a/o x3. Vitals stable 130/72 hr 72 94% ra.

## 2013-12-06 NOTE — ED Notes (Addendum)
Plastics MD at bedside suturing pt.

## 2013-12-07 ENCOUNTER — Encounter (HOSPITAL_COMMUNITY): Payer: Self-pay | Admitting: *Deleted

## 2013-12-07 ENCOUNTER — Inpatient Hospital Stay (HOSPITAL_COMMUNITY): Payer: Worker's Compensation

## 2013-12-07 LAB — BASIC METABOLIC PANEL
Anion gap: 12 (ref 5–15)
BUN: 16 mg/dL (ref 6–23)
CHLORIDE: 100 meq/L (ref 96–112)
CO2: 25 mEq/L (ref 19–32)
Calcium: 8.5 mg/dL (ref 8.4–10.5)
Creatinine, Ser: 0.72 mg/dL (ref 0.50–1.35)
GFR calc Af Amer: >90 mL/min (ref >90)
GFR calc non Af Amer: >90 mL/min (ref >90)
GLUCOSE: 137 mg/dL — AB (ref 70–99)
POTASSIUM: 4.8 meq/L (ref 3.7–5.3)
SODIUM: 137 meq/L (ref 137–147)

## 2013-12-07 LAB — CBC
HCT: 39.5 % (ref 39.0–52.0)
HEMOGLOBIN: 12.9 g/dL — AB (ref 13.0–17.0)
MCH: 30.2 pg (ref 26.0–34.0)
MCHC: 32.7 g/dL (ref 30.0–36.0)
MCV: 92.5 fL (ref 78.0–100.0)
Platelets: 217 10*3/uL (ref 150–400)
RBC: 4.27 MIL/uL (ref 4.22–5.81)
RDW: 12.9 % (ref 11.5–15.5)
WBC: 13.6 10*3/uL — ABNORMAL HIGH (ref 4.0–10.5)

## 2013-12-07 MED ORDER — OXYCODONE HCL 5 MG PO TABS
5.0000 mg | ORAL_TABLET | ORAL | Status: DC | PRN
  Administered 2013-12-07 – 2013-12-09 (×8): 10 mg via ORAL
  Filled 2013-12-07 (×8): qty 2

## 2013-12-07 MED ORDER — INFLUENZA VAC SPLIT QUAD 0.5 ML IM SUSY
0.5000 mL | PREFILLED_SYRINGE | INTRAMUSCULAR | Status: AC
  Administered 2013-12-08: 0.5 mL via INTRAMUSCULAR
  Filled 2013-12-07: qty 0.5

## 2013-12-07 MED ORDER — TIZANIDINE HCL 4 MG PO TABS
4.0000 mg | ORAL_TABLET | Freq: Three times a day (TID) | ORAL | Status: DC | PRN
  Filled 2013-12-07: qty 1

## 2013-12-07 MED ORDER — MECLIZINE HCL 12.5 MG PO TABS
25.0000 mg | ORAL_TABLET | Freq: Three times a day (TID) | ORAL | Status: DC | PRN
  Administered 2013-12-07: 25 mg via ORAL
  Filled 2013-12-07 (×2): qty 1

## 2013-12-07 MED ORDER — PREDNISONE 20 MG PO TABS
80.0000 mg | ORAL_TABLET | Freq: Every day | ORAL | Status: DC
  Administered 2013-12-07 – 2013-12-09 (×3): 80 mg via ORAL
  Filled 2013-12-07 (×2): qty 1
  Filled 2013-12-07: qty 4
  Filled 2013-12-07: qty 1

## 2013-12-07 MED ORDER — PNEUMOCOCCAL VAC POLYVALENT 25 MCG/0.5ML IJ INJ
0.5000 mL | INJECTION | INTRAMUSCULAR | Status: AC
  Administered 2013-12-08: 0.5 mL via INTRAMUSCULAR
  Filled 2013-12-07: qty 0.5

## 2013-12-07 NOTE — Plan of Care (Signed)
Problem: Phase I Progression Outcomes Goal: OOB as tolerated unless otherwise ordered Outcome: Progressing Goal: Initial discharge plan identified Outcome: Progressing  Problem: Phase III Progression Outcomes Goal: Pain controlled on oral analgesia Outcome: Progressing Goal: Voiding independently Outcome: Progressing

## 2013-12-07 NOTE — Progress Notes (Signed)
12/07/2013 5:57 PM  Annell GreeningBritten, [place] [id number]  Hosp Day 2    Temp:  [97.6 F (36.4 C)-98.8 F (37.1 C)] 98.8 F (37.1 C) (11/10 1600) Pulse Rate:  [66-83] 76 (11/10 1600) Resp:  [11-20] 13 (11/10 1700) BP: (107-136)/(66-87) 132/82 mmHg (11/10 1500) SpO2:  [92 %-100 %] 95 % (11/10 1600) Weight:  [114.7 kg (252 lb 13.9 oz)] 114.7 kg (252 lb 13.9 oz) (11/09 2000),     Intake/Output Summary (Last 24 hours) at 12/07/13 1757 Last data filed at 12/07/13 1700  Gross per 24 hour  Intake 2058.33 ml  Output   1385 ml  Net 673.33 ml    Results for orders placed or performed during the hospital encounter of 12/06/13 (from the past 24 hour(s))  MRSA PCR Screening     Status: None   Collection Time: 12/06/13  8:29 PM  Result Value Ref Range   MRSA by PCR NEGATIVE NEGATIVE  CDS serology     Status: None   Collection Time: 12/06/13  9:55 PM  Result Value Ref Range   CDS serology specimen      SPECIMEN WILL BE HELD FOR 14 DAYS IF TESTING IS REQUIRED  CBC     Status: Abnormal   Collection Time: 12/07/13  2:06 AM  Result Value Ref Range   WBC 13.6 (H) 4.0 - 10.5 K/uL   RBC 4.27 4.22 - 5.81 MIL/uL   Hemoglobin 12.9 (L) 13.0 - 17.0 g/dL   HCT 40.939.5 81.139.0 - 91.452.0 %   MCV 92.5 78.0 - 100.0 fL   MCH 30.2 26.0 - 34.0 pg   MCHC 32.7 30.0 - 36.0 g/dL   RDW 78.212.9 95.611.5 - 21.315.5 %   Platelets 217 150 - 400 K/uL  Basic metabolic panel     Status: Abnormal   Collection Time: 12/07/13  2:06 AM  Result Value Ref Range   Sodium 137 137 - 147 mEq/L   Potassium 4.8 3.7 - 5.3 mEq/L   Chloride 100 96 - 112 mEq/L   CO2 25 19 - 32 mEq/L   Glucose, Bld 137 (H) 70 - 99 mg/dL   BUN 16 6 - 23 mg/dL   Creatinine, Ser 0.860.72 0.50 - 1.35 mg/dL   Calcium 8.5 8.4 - 57.810.5 mg/dL   GFR calc non Af Amer >90 >90 mL/min   GFR calc Af Amer >90 >90 mL/min   Anion gap 12 5 - 15   I did not evaluate the patient today.  I did review the CT Temporal bones with Dr.Curnes.  There may be a fracture through the anterior  horizontal portion of the RIGHT facial canal, adjacent to the geniculate ganglion.     I discussed the case [facility]elf[facility]Eric [facility]. We will wait and see for any spontaneous recovery.  We will use high dose po steroids.  He is willing to see the patient for consideration of decompression later.  Ossiculoplasty can definitely wait until everything has stabilized.      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  PLAN:    Pred.  F/u my office 1 week.  Flo ShanksWOLICKI, Towanna Avery

## 2013-12-07 NOTE — Progress Notes (Signed)
Subjective: Patient resting comfortably in bed, without complaints.  Objective: Vital signs in last 24 hours: Filed Vitals:   12/07/13 0315 12/07/13 0400 12/07/13 0500 12/07/13 0600  BP: 126/75 127/74 135/67 114/75  Pulse: 75 75 73 74  Temp:  98.2 F (36.8 C)    TempSrc:  Oral    Resp: 11 16 12 13   Height:      Weight:      SpO2: 98% 94% 95% 95%    Intake/Output from previous day: 11/09 0701 - 11/10 0700 In: 958.3 [I.V.:958.3] Out: 985 [Urine:985] Intake/Output this shift:    Physical Exam:  Awake and alert, oriented to name, [facility], [facility], and November 2015. Following commands. Speech dysarthric, but fluent.  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 Diminished hearing on right remains.  Moving all extremities well. No drift of upper extremities.  CBC  Recent Labs  12/06/13 1408 12/07/13 0206  WBC 15.7* 13.6*  HGB 14.0 12.9*  HCT 42.0 39.5  PLT 236 217   BMET  Recent Labs  12/06/13 1408 12/07/13 0206  NA 141 137  K 3.3* 4.8  CL 100 100  CO2 24 25  GLUCOSE 125* 137*  BUN 15 16  CREATININE 0.96 0.72  CALCIUM 8.7 8.5   ABG No results found for: PHART, PCO2ART, PO2ART, HCO3, TCO2, ACIDBASEDEF, O2SAT  Studies/Results: Ct Angio Head W/cm &/or Wo Cm  12/06/2013   CLINICAL DATA:  Hit by forklift. Skull fractures. Rule out arterial injury.  EXAM: CT ANGIOGRAPHY HEAD AND NECK  TECHNIQUE: Multidetector CT imaging of the head and neck was performed using the standard protocol during bolus administration of intravenous contrast. Multiplanar CT image reconstructions and MIPs were obtained to evaluate the vascular anatomy. Carotid stenosis measurements (when applicable) are obtained utilizing NASCET criteria, using the distal internal carotid diameter as the denominator.  CONTRAST:  50mL OMNIPAQUE IOHEXOL 350 MG/ML SOLN  COMPARISON:  CT the head and cervical spine earlier today  FINDINGS: CTA HEAD FINDINGS  Ventricle size is normal.  No intracranial hemorrhage. There is pneumocephalus with gas in the right cavernous sinus and between the clivus and basilar and suprasellar cistern. This is compatible with basilar skull fractures. Bilateral mastoid sinus fractures are noted. There is gas throughout the deep soft tissues of the neck presumably related to the skullbase fractures. Air-fluid levels in the right maxillary and sphenoid sinuses.  Both vertebral arteries are normal. The basilar is normal. Cerebellar and posterior cerebral arteries are normal.  Cavernous carotid is normal bilaterally. No evidence of carotid dissection or injury. Petrous segment is normal. Anterior and middle cerebral arteries are normal. Negative for aneurysm.  Review of the MIP images confirms the above findings.  CTA NECK FINDINGS  Proximal great vessels are normal. No mass or adenopathy in the neck.  Carotid artery is normal bilaterally. Negative for carotid injury. No atherosclerotic disease or dissection.  Both vertebral arteries are equal in size and widely patent. No significant atherosclerotic disease or dissection.  Review of the MIP images confirms the above findings.  IMPRESSION: Negative for arterial injury of the carotid or basilar artery bilaterally.  Skullbase fractures with pneumocephalus.   Electronically Signed   By: Marlan Palauharles  Clark M.D.   On: 12/06/2013 18:13   Ct Head Wo Contrast  12/06/2013   CLINICAL DATA:  Patient was pinned between a forklift and rack at work, difficult hearing in both ears, lacerations to LEFT face and LEFT eye  EXAM: CT HEAD WITHOUT CONTRAST  CT MAXILLOFACIAL WITHOUT CONTRAST  CT CERVICAL SPINE WITHOUT CONTRAST  TECHNIQUE: Multidetector CT imaging of the head, cervical spine, and maxillofacial structures were performed using the standard protocol without intravenous contrast. Multiplanar CT image reconstructions of the cervical spine and maxillofacial structures were also generated. RIGHT-side of face marked with a BB.   COMPARISON:  None.  FINDINGS: CT HEAD FINDINGS  Normal ventricular morphology.  No midline shift or mass effect.  Normal appearance of brain parenchyma.  No intracranial hemorrhage, mass lesion or evidence acute infarction.  Air present at the cavernous sinuses bilaterally, in the sella turcica, and suprasellar compatible with basilar skull fracture.  Partially opacified mastoid air cells bilaterally.  Air-fluid levels in RIGHT maxillary and sphenoid sinuses with partial ethmoid opacification bilaterally.  No calvarial fracture identified.  CT MAXILLOFACIAL FINDINGS  Air present within sella turcica, cavernous sinuses and suprasellar region.  Air-fluid level/hemorrhage within RIGHT sphenoid sinus and RIGHT maxillary sinus.  Partial opacification of ethmoid air cells bilaterally.  Small mucosal retention cyst LEFT maxillary sinus.  Frontal sinus and LEFT maxillary sinus otherwise clear.  Partial opacification of mastoid air cells bilaterally.  Alignment of the LEFT ossicles grossly normal.  Disruption of RIGHT ossicular chain with displacement of the malleus.  RIGHT temporal bone fracture extends to the RIGHT sphenoid across the lateral and anterior walls of the RIGHT sphenoid sinus.  No definite fractures of the orbits, maxillary sinuses or zygomas.  Soft tissue gas identified anterior and inferior to the mastoid air cells bilaterally and tracking inferiorly into the parapharyngeal spaces.  Gas is seen near the courses of the internal carotid arteries bilaterally at the carotid siphons but no discrete fracture planes involving the siphons are visualized.  Intraorbital soft tissue planes clear.  CT CERVICAL SPINE FINDINGS  Partial BILATERAL mastoid air cell opacification.  Prevertebral soft tissues normal thickness.  Minimal scattered disc space narrowing and endplate spur formation.  Vertebral body heights maintained.  No acute cervical spine fracture, subluxation, or bone destruction.  Lung apices clear.   IMPRESSION: No acute intracranial abnormalities.  Degenerative disc disease changes of the cervical spine.  No acute cervical spine abnormalities.  BILATERAL temporal bone fractures with partial opacification of the mastoid air cells bilaterally.  Disruption of the RIGHT ossicular chain.  Extension of RIGHT temporal fracture into the RIGHT sphenoid bone and traversing the RIGHT sphenoid sinus.  Though no definite involvement of the carotid canals is seen bilaterally, fracture planes particularly on the RIGHT are in close proximity to the carotid canals and if there is clinical concern for internal carotid injury or presence of neurologic impairment then consider CTA head to assess the internal carotid arteries at the skullbase.  Associated soft tissue gas caudal to the skullbase as well as intracranial gas at the cavernous sinuses, sella turcica and suprasellar regions.  Findings called to Dr. Modesto Charon on 12/06/2013 at 1650 hr.   Electronically Signed   By: Ulyses Southward M.D.   On: 12/06/2013 16:52   Ct Angio Neck W/cm &/or Wo/cm  12/06/2013   CLINICAL DATA:  Hit by forklift. Skull fractures. Rule out arterial injury.  EXAM: CT ANGIOGRAPHY HEAD AND NECK  TECHNIQUE: Multidetector CT imaging of the head and neck was performed using the standard protocol during bolus administration of intravenous contrast. Multiplanar CT image reconstructions and MIPs were obtained to evaluate the vascular anatomy. Carotid stenosis measurements (when applicable) are obtained utilizing NASCET criteria, using the distal internal carotid diameter as the denominator.  CONTRAST:  50mL OMNIPAQUE  IOHEXOL 350 MG/ML SOLN  COMPARISON:  CT the head and cervical spine earlier today  FINDINGS: CTA HEAD FINDINGS  Ventricle size is normal. No intracranial hemorrhage. There is pneumocephalus with gas in the right cavernous sinus and between the clivus and basilar and suprasellar cistern. This is compatible with basilar skull fractures. Bilateral mastoid  sinus fractures are noted. There is gas throughout the deep soft tissues of the neck presumably related to the skullbase fractures. Air-fluid levels in the right maxillary and sphenoid sinuses.  Both vertebral arteries are normal. The basilar is normal. Cerebellar and posterior cerebral arteries are normal.  Cavernous carotid is normal bilaterally. No evidence of carotid dissection or injury. Petrous segment is normal. Anterior and middle cerebral arteries are normal. Negative for aneurysm.  Review of the MIP images confirms the above findings.  CTA NECK FINDINGS  Proximal great vessels are normal. No mass or adenopathy in the neck.  Carotid artery is normal bilaterally. Negative for carotid injury. No atherosclerotic disease or dissection.  Both vertebral arteries are equal in size and widely patent. No significant atherosclerotic disease or dissection.  Review of the MIP images confirms the above findings.  IMPRESSION: Negative for arterial injury of the carotid or basilar artery bilaterally.  Skullbase fractures with pneumocephalus.   Electronically Signed   By: Marlan Palau M.D.   On: 12/06/2013 18:13   Ct Chest W Contrast  12/06/2013   CLINICAL DATA:  Trauma. Tenth between 4 clip in and around back. Severe right lower quadrant pain. Right arm limited range of motion.  EXAM: CT CHEST, ABDOMEN, AND PELVIS WITH CONTRAST  TECHNIQUE: Multidetector CT imaging of the chest, abdomen and pelvis was performed following the standard protocol during bolus administration of intravenous contrast.  CONTRAST:  90 cc Omnipaque 300 intravenous  COMPARISON:  None.  FINDINGS: CT CHEST FINDINGS  THORACIC INLET/BODY WALL:  No acute abnormality.  MEDIASTINUM:  Normal heart size. No pericardial effusion. No acute vascular abnormality. No adenopathy.  LUNG WINDOWS:  No contusion, hemothorax, or pneumothorax. Mild scattered atelectasis. Incidental air trapping in the apical left upper lobe; noted history of asthma.  OSSEOUS:  See  below  CT ABDOMEN AND PELVIS FINDINGS  BODY WALL: Subcutaneous reticulation in the right lower lumbar region consistent with contusion.  Liver: No focal abnormality.  Biliary: No evidence of biliary obstruction or stone.  Pancreas: Unremarkable.  Spleen: Unremarkable.  Adrenals: Unremarkable.  Kidneys and ureters: No evidence of injury.  Bladder: Unremarkable.  Reproductive: Unremarkable.  Bowel: No evidence of injury. Extensive distal colonic diverticulosis.  Retroperitoneum: No mass or adenopathy.  Peritoneum: No free fluid or gas.  Vascular: No acute findings.  OSSEOUS: No findings to explain right shoulder symptoms. Note that the upper scapula, humerus, and distal clavicle is not visualized.  L1 through L4 right transverse process fractures with up to moderate displacement/distraction. There is associated hemorrhage within the right quadratus lumborum and neighboring deep back muscles. No measurable hematoma or evidence of active hemorrhage.  IMPRESSION: 1. L1 through L4 right transverse process fractures. 2. No acute intrathoracic or intra-abdominal findings. 3. Extensive colonic diverticulosis.   Electronically Signed   By: Tiburcio Pea M.D.   On: 12/06/2013 16:04   Ct Cervical Spine Wo Contrast  12/06/2013   CLINICAL DATA:  Patient was pinned between a forklift and rack at work, difficult hearing in both ears, lacerations to LEFT face and LEFT eye  EXAM: CT HEAD WITHOUT CONTRAST  CT MAXILLOFACIAL WITHOUT CONTRAST  CT CERVICAL SPINE WITHOUT  CONTRAST  TECHNIQUE: Multidetector CT imaging of the head, cervical spine, and maxillofacial structures were performed using the standard protocol without intravenous contrast. Multiplanar CT image reconstructions of the cervical spine and maxillofacial structures were also generated. RIGHT-side of face marked with a BB.  COMPARISON:  None.  FINDINGS: CT HEAD FINDINGS  Normal ventricular morphology.  No midline shift or mass effect.  Normal appearance of brain  parenchyma.  No intracranial hemorrhage, mass lesion or evidence acute infarction.  Air present at the cavernous sinuses bilaterally, in the sella turcica, and suprasellar compatible with basilar skull fracture.  Partially opacified mastoid air cells bilaterally.  Air-fluid levels in RIGHT maxillary and sphenoid sinuses with partial ethmoid opacification bilaterally.  No calvarial fracture identified.  CT MAXILLOFACIAL FINDINGS  Air present within sella turcica, cavernous sinuses and suprasellar region.  Air-fluid level/hemorrhage within RIGHT sphenoid sinus and RIGHT maxillary sinus.  Partial opacification of ethmoid air cells bilaterally.  Small mucosal retention cyst LEFT maxillary sinus.  Frontal sinus and LEFT maxillary sinus otherwise clear.  Partial opacification of mastoid air cells bilaterally.  Alignment of the LEFT ossicles grossly normal.  Disruption of RIGHT ossicular chain with displacement of the malleus.  RIGHT temporal bone fracture extends to the RIGHT sphenoid across the lateral and anterior walls of the RIGHT sphenoid sinus.  No definite fractures of the orbits, maxillary sinuses or zygomas.  Soft tissue gas identified anterior and inferior to the mastoid air cells bilaterally and tracking inferiorly into the parapharyngeal spaces.  Gas is seen near the courses of the internal carotid arteries bilaterally at the carotid siphons but no discrete fracture planes involving the siphons are visualized.  Intraorbital soft tissue planes clear.  CT CERVICAL SPINE FINDINGS  Partial BILATERAL mastoid air cell opacification.  Prevertebral soft tissues normal thickness.  Minimal scattered disc space narrowing and endplate spur formation.  Vertebral body heights maintained.  No acute cervical spine fracture, subluxation, or bone destruction.  Lung apices clear.  IMPRESSION: No acute intracranial abnormalities.  Degenerative disc disease changes of the cervical spine.  No acute cervical spine abnormalities.   BILATERAL temporal bone fractures with partial opacification of the mastoid air cells bilaterally.  Disruption of the RIGHT ossicular chain.  Extension of RIGHT temporal fracture into the RIGHT sphenoid bone and traversing the RIGHT sphenoid sinus.  Though no definite involvement of the carotid canals is seen bilaterally, fracture planes particularly on the RIGHT are in close proximity to the carotid canals and if there is clinical concern for internal carotid injury or presence of neurologic impairment then consider CTA head to assess the internal carotid arteries at the skullbase.  Associated soft tissue gas caudal to the skullbase as well as intracranial gas at the cavernous sinuses, sella turcica and suprasellar regions.  Findings called to Dr. Modesto CharonWong on 12/06/2013 at 1650 hr.   Electronically Signed   By: Ulyses SouthwardMark  Boles M.D.   On: 12/06/2013 16:52   Ct Abdomen Pelvis W Contrast  12/06/2013   CLINICAL DATA:  Trauma. Tenth between 4 clip in and around back. Severe right lower quadrant pain. Right arm limited range of motion.  EXAM: CT CHEST, ABDOMEN, AND PELVIS WITH CONTRAST  TECHNIQUE: Multidetector CT imaging of the chest, abdomen and pelvis was performed following the standard protocol during bolus administration of intravenous contrast.  CONTRAST:  90 cc Omnipaque 300 intravenous  COMPARISON:  None.  FINDINGS: CT CHEST FINDINGS  THORACIC INLET/BODY WALL:  No acute abnormality.  MEDIASTINUM:  Normal heart size. No pericardial  effusion. No acute vascular abnormality. No adenopathy.  LUNG WINDOWS:  No contusion, hemothorax, or pneumothorax. Mild scattered atelectasis. Incidental air trapping in the apical left upper lobe; noted history of asthma.  OSSEOUS:  See below  CT ABDOMEN AND PELVIS FINDINGS  BODY WALL: Subcutaneous reticulation in the right lower lumbar region consistent with contusion.  Liver: No focal abnormality.  Biliary: No evidence of biliary obstruction or stone.  Pancreas: Unremarkable.  Spleen:  Unremarkable.  Adrenals: Unremarkable.  Kidneys and ureters: No evidence of injury.  Bladder: Unremarkable.  Reproductive: Unremarkable.  Bowel: No evidence of injury. Extensive distal colonic diverticulosis.  Retroperitoneum: No mass or adenopathy.  Peritoneum: No free fluid or gas.  Vascular: No acute findings.  OSSEOUS: No findings to explain right shoulder symptoms. Note that the upper scapula, humerus, and distal clavicle is not visualized.  L1 through L4 right transverse process fractures with up to moderate displacement/distraction. There is associated hemorrhage within the right quadratus lumborum and neighboring deep back muscles. No measurable hematoma or evidence of active hemorrhage.  IMPRESSION: 1. L1 through L4 right transverse process fractures. 2. No acute intrathoracic or intra-abdominal findings. 3. Extensive colonic diverticulosis.   Electronically Signed   By: Tiburcio Pea M.D.   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 Facial trauma.  EXAM: PORTABLE PELVIS 1-2 VIEWS  COMPARISON:  None.  FINDINGS: There is no evidence of pelvic fracture or dislocation. There are mild degenerative joint changes with narrowed bilateral hip joint spaces.  IMPRESSION: No acute fracture or dislocation.   Electronically Signed   By: Sherian Rein M.D.   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 Facial trauma Left eyelid, missing tooth top left and Right ear. LOC after intial accident, back at baseline. Back pain.  EXAM: PORTABLE CHEST - 1 VIEW  COMPARISON:  None.  FINDINGS: The heart size and mediastinal contours are within normal limits. Both lungs are clear. The visualized skeletal structures are unremarkable.  IMPRESSION: No active disease.   Electronically Signed   By: Elige Ko   On: 12/06/2013 15:00   Ct Maxillofacial Wo Cm  12/06/2013   CLINICAL DATA:  Patient was pinned between a forklift and rack at work, difficult hearing in both ears, lacerations to LEFT face and LEFT eye  EXAM: CT HEAD WITHOUT CONTRAST  CT MAXILLOFACIAL WITHOUT CONTRAST  CT CERVICAL SPINE WITHOUT CONTRAST  TECHNIQUE: Multidetector CT imaging of the head, cervical spine, and maxillofacial structures were performed using the standard protocol without intravenous contrast. Multiplanar CT image reconstructions of the cervical spine and maxillofacial structures were also generated. RIGHT-side of face marked with a BB.  COMPARISON:  None.  FINDINGS: CT HEAD FINDINGS  Normal ventricular morphology.  No midline shift or mass effect.  Normal appearance of brain parenchyma.  No intracranial hemorrhage, mass lesion or evidence acute infarction.  Air present at the cavernous sinuses bilaterally, in the sella turcica, and suprasellar compatible with basilar skull fracture.  Partially opacified mastoid air cells bilaterally.  Air-fluid levels in RIGHT maxillary and sphenoid sinuses with partial ethmoid opacification bilaterally.  No calvarial fracture identified.  CT MAXILLOFACIAL FINDINGS  Air present within sella turcica, cavernous sinuses and suprasellar region.  Air-fluid level/hemorrhage within RIGHT sphenoid sinus  and RIGHT maxillary sinus.  Partial opacification of ethmoid air cells bilaterally.  Small mucosal retention cyst LEFT maxillary sinus.  Frontal sinus and LEFT maxillary sinus otherwise clear.  Partial opacification of mastoid air cells bilaterally.  Alignment of the LEFT ossicles grossly normal.  Disruption of RIGHT ossicular chain with displacement of the malleus.  RIGHT temporal bone fracture extends to the RIGHT sphenoid across the lateral and anterior walls of the RIGHT sphenoid sinus.  No definite fractures of the orbits, maxillary sinuses or zygomas.  Soft tissue gas identified anterior and  inferior to the mastoid air cells bilaterally and tracking inferiorly into the parapharyngeal spaces.  Gas is seen near the courses of the internal carotid arteries bilaterally at the carotid siphons but no discrete fracture planes involving the siphons are visualized.  Intraorbital soft tissue planes clear.  CT CERVICAL SPINE FINDINGS  Partial BILATERAL mastoid air cell opacification.  Prevertebral soft tissues normal thickness.  Minimal scattered disc space narrowing and endplate spur formation.  Vertebral body heights maintained.  No acute cervical spine fracture, subluxation, or bone destruction.  Lung apices clear.  IMPRESSION: No acute intracranial abnormalities.  Degenerative disc disease changes of the cervical spine.  No acute cervical spine abnormalities.  BILATERAL temporal bone fractures with partial opacification of the mastoid air cells bilaterally.  Disruption of the RIGHT ossicular chain.  Extension of RIGHT temporal fracture into the RIGHT sphenoid bone and traversing the RIGHT sphenoid sinus.  Though no definite involvement of the carotid canals is seen bilaterally, fracture planes particularly on the RIGHT are in close proximity to the carotid canals and if there is clinical concern for internal carotid injury or presence of neurologic impairment then consider CTA head to assess the internal carotid arteries at the skullbase.  Associated soft tissue gas caudal to the skullbase as well as intracranial gas at the cavernous sinuses, sella turcica and suprasellar regions.  Findings called to Dr. Modesto Charon on 12/06/2013 at 1650 hr.   Electronically Signed   By: Ulyses Southward M.D.   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  For follow-up CT of the brain without contrast later today.   Hewitt Shorts, MD 12/07/2013, 7:13 AM

## 2013-12-07 NOTE — Progress Notes (Signed)
Audiology Note: I spoke with Sheralyn BoatmanMary, Acxel Heng's nurse to explain that [facility] and can evaluate [name] at that time.  If the hearing test needs to be performed before tomorrow morning, please call [phone] and I will try to make arrangements for later this afternoon.  Sherri A. Earlene Plateravis, Au.D., [facility] Doctor of Audiology 12/07/2013 9:04 AM

## 2013-12-07 NOTE — Progress Notes (Signed)
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 R ear and L eyelid laceration. L1-4 TVP fxs   Clinical Impression   PTA, pt independent with all ADL and mobility. Pt presents with significant deficits listed below. At this time, recommend CIR for therapy to facilitate D/C home. Discussed recommendation to use eye patch at this time, alternating q 2 hrs for horizontal diplopia. Will further assess with occlusion taping as pt tolerates. Also recommend vestibular eval. Pt with increased c/o back pain, may benefit from using lumbar corsett for support when OOB. Will follow acutely to maximize functional level of independence and facilitate D/C to next venue of care.     Follow Up Recommendations  CIR;Supervision/Assistance - 24 hour    Equipment Recommendations  3 in 1 bedside comode;Tub/shower bench    Recommendations for Other Services Rehab consult     Precautions / Restrictions Precautions Precautions: Fall;Other (comment) (vestibular)      Mobility Bed Mobility Overal bed mobility: Needs Assistance Bed Mobility: Sidelying to Sit;Rolling Rolling: Supervision Sidelying to sit: Mod assist       General bed mobility comments: Limited by back pain and c/o vertigo  Transfers Overall transfer level: Needs assistance   Transfers: Sit to/from Stand;Stand Pivot Transfers Sit to Stand: Min assist Stand pivot transfers: Mod assist       General transfer comment: increased assistance needed during stand pivot transfer due to pt moving his head and experienced LOB posteirorly    Balance Overall balance assessment: Needs assistance Sitting-balance support: Feet supported;Bilateral upper extremity supported Sitting balance-Leahy Scale: Fair      Standing balance support: During functional activity Standing balance-Leahy Scale: Poor                              ADL Overall ADL's : Needs assistance/impaired     Grooming: Minimal assistance Grooming Details (indicate cue type and reason): difficulty due to pain,vertigo and facial weakness Upper Body Bathing: Minimal assitance;Bed level   Lower Body Bathing: Maximal assistance;Sit to/from stand   Upper Body Dressing : Moderate assistance;Bed level   Lower Body Dressing: Maximal assistance;Sit to/from stand               Functional mobility during ADLs: Moderate assistance;Cueing for safety;Cueing for sequencing General ADL Comments: Eye occluded with guaze during mobility to decrease c/o diplopia. Pt instructed to maintain focal point during mobility due to c/o positional vertigo     Vision                Per chart, pt with CN VI palsy R eye. Additional Comments: Pt with c/o horizontal diplopia. unable to assess functionally due to c/o nausea from apparent vertigo. will further assess. Diplopia resolves when 1 eye closed. At this time, rec use of eye patch alternating q 2 hours. Delayed R eyelid closure due to CN VII palsy   Perception     Praxis      Pertinent Vitals/Pain Pain Assessment: 0-10 Pain Score: 9  Pain Location: back and head Pain Descriptors / Indicators: Aching;Jabbing Pain Intervention(s): Limited activity within patient's tolerance;Monitored during session;Repositioned;Patient requesting pain meds-RN notified     Hand Dominance Right   Extremity/Trunk Assessment Upper Extremity Assessment Upper Extremity  Assessment: Overall WFL for tasks assessed   Lower Extremity Assessment Lower Extremity Assessment: Defer to PT evaluation   Cervical / Trunk Assessment Cervical / Trunk Assessment: Other exceptions (L1-4 TVP fxs)   Communication Communication Communication: HOH (decreased hearing R ear)   Cognition  Arousal/Alertness: Awake/alert Behavior During Therapy: WFL for tasks assessed/performed Overall Cognitive Status: No family/caregiver present to determine baseline cognitive functioning (will further assess. Pt found unconscious)                     General Comments   Excellent participation despite c/o nausea    Exercises       Shoulder Instructions      Home Living Family/patient expects to be discharged to:: Private residence Living Arrangements: Spouse/significant other Available Help at Discharge: Family Type of Home: House Home Access: Stairs to enter Secretary/administratorntrance Stairs-Number of Steps: 2   Home Layout: Two level;Bed/bath upstairs Alternate Level Stairs-Number of Steps: flight   Bathroom Shower/Tub: Chief Strategy OfficerTub/shower unit   Bathroom Toilet: Standard Bathroom Accessibility: Yes How Accessible: Accessible via walker Home Equipment: None          Prior Functioning/Environment Level of Independence: Independent        Comments: working    OT Diagnosis: Generalized weakness;Disturbance of vision;Acute pain;Other (comment) (vestibular deficits)   OT Problem List: Decreased strength;Decreased range of motion;Decreased activity tolerance;Impaired balance (sitting and/or standing);Impaired vision/perception;Decreased coordination;Decreased knowledge of use of DME or AE;Decreased knowledge of precautions;Pain   OT Treatment/Interventions: Self-care/ADL training;Therapeutic exercise;DME and/or AE instruction;Neuromuscular education;Therapeutic activities;Visual/perceptual remediation/compensation;Patient/family education;Balance training    OT Goals(Current goals can be found in the care plan section) Acute Rehab OT Goals Patient Stated Goal: to not feel sick OT Goal Formulation: With patient Time For Goal Achievement: 12/21/13 Potential to Achieve Goals: Good  OT Frequency: Min 3X/week   Barriers to D/C:            Co-evaluation              End of  Session Nurse Communication: Mobility status;Precautions;Other (comment) (pt maintain focal point; limit head movement during mobility)  Activity Tolerance: Other (comment) (limited by c/o nausea) Patient left: in chair;with call bell/phone within reach   Time: [id number] OT Time Calculation (min): 36 min Charges:  OT General Charges $OT Visit: 1 Procedure OT Evaluation $Initial OT Evaluation Tier I: 1 Procedure OT Treatments $Self Care/Home Management : 8-22 mins $Therapeutic Activity: 8-22 mins G-Codes:    Giovan Pinsky,HILLARY 12/07/2013, 5:32 PM   [facility] Ita Fritzsche, OTR/L  [phone] 12/07/2013

## 2013-12-07 NOTE — Plan of Care (Signed)
Problem: Consults Goal: General Medical Patient Education See Patient Education Module for specific education. Outcome: Progressing  Problem: Phase I Progression Outcomes Goal: Pain controlled with appropriate interventions Outcome: Progressing Goal: Voiding-avoid urinary catheter unless indicated Outcome: Completed/Met Date Met:  12/07/13 Goal: Hemodynamically stable Outcome: Completed/Met Date Met:  12/07/13

## 2013-12-07 NOTE — Progress Notes (Signed)
Patient ID: Michael Hooper, male   DOB: December 12, 1960, 53 y.o.   MRN: [id number]    Subjective: C/O continued hearing loss R ear. Stable overnight.  Objective: Vital signs in last 24 hours: Temp:  [97.4 F (36.3 C)-98.2 F (36.8 C)] 98.2 F (36.8 C) (11/10 0400) Pulse Rate:  [69-83] 70 (11/10 0700) Resp:  [10-21] 15 (11/10 0700) BP: (107-136)/(63-87) 131/75 mmHg (11/10 0700) SpO2:  [92 %-100 %] 96 % (11/10 0700) Weight:  [252 lb 13.9 oz (114.7 kg)] 252 lb 13.9 oz (114.7 kg) (11/09 2000)    Intake/Output from previous day: 11/09 0701 - 11/10 0700 In: 1058.3 [I.V.:1058.3] Out: 985 [Urine:985] Intake/Output this shift:    General appearance: alert and cooperative Head: L eyelid lac closed Ears: xeroform on R ear lac repair Resp: clear to auscultation bilaterally Cardio: regular rate and rhythm GI: soft, NT, ND Neuro: R VI nerve and VII nerve deficits persist  Lab Results: CBC   Recent Labs  12/06/13 1408 12/07/13 0206  WBC 15.7* 13.6*  HGB 14.0 12.9*  HCT 42.0 39.5  PLT 236 217   BMET  Recent Labs  12/06/13 1408 12/07/13 0206  NA 141 137  K 3.3* 4.8  CL 100 100  CO2 24 25  GLUCOSE 125* 137*  BUN 15 16  CREATININE 0.96 0.72  CALCIUM 8.7 8.5   PT/INR  Recent Labs  12/06/13 1408  LABPROT 14.8  INR 1.14    Anti-infectives: Anti-infectives    None      Assessment/Plan: Industrial accident B temporal bone FXs/R sphenoid FX/R ear ossicle FX - associated CN VI and VII deficits on R. Appreciate Dr. Raye SorrowWolicki's evaluation Pneumocephalus associated with above - Dr. Newell CoralNudelman following, repeat CT head and temporal bones today at 1000 R ear and L eyelid lacs - S?P closure by Dr. Kelly SplinterSanger L1-4 TVP FXs - add MM relaxer FEN - lytes OK, start clears VTE - PAS for now P above F/U CTs   LOS: 1 day    Violeta GelinasBurke Angad Nabers, MD, MPH, FACS Trauma: [phone] General Surgery: [phone]  12/07/2013

## 2013-12-08 ENCOUNTER — Encounter (HOSPITAL_COMMUNITY): Payer: Self-pay | Admitting: Physical Medicine and Rehabilitation

## 2013-12-08 MED ORDER — MORPHINE SULFATE 2 MG/ML IJ SOLN
2.0000 mg | INTRAMUSCULAR | Status: DC | PRN
  Administered 2013-12-08: 2 mg via INTRAVENOUS
  Filled 2013-12-08: qty 1

## 2013-12-08 MED ORDER — POTASSIUM CHLORIDE IN NACL 20-0.9 MEQ/L-% IV SOLN
INTRAVENOUS | Status: DC
  Administered 2013-12-08 – 2013-12-09 (×2): via INTRAVENOUS
  Filled 2013-12-08: qty 1000

## 2013-12-08 NOTE — Progress Notes (Signed)
Subjective: Patient resting in bed comfortably. Trauma surgery transferring from neurosurgery ICU to 4 N.  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    Objective: Vital signs in last 24 hours: Filed Vitals:   12/08/13 0750 12/08/13 0800 12/08/13 0900 12/08/13 1151  BP:  132/77 143/79   Pulse:  68 82   Temp: 98.7 F (37.1 C)   97.6 F (36.4 C)  TempSrc: Axillary   Oral  Resp:  9 17   Height:      Weight:      SpO2:  97% 97%     Intake/Output from previous day: 11/10 0701 - 11/11 0700 In: 2400 [I.V.:2400] Out: 2425 [Urine:2425] Intake/Output this shift: Total I/O In: 600 [I.V.:600] Out: 200 [Urine:200]  Physical Exam:  Awake and alert, fully oriented. Speech fluent. Following commands. Able to laterally deviate the right eye slightly to the right of midline indicating some recovery of right sixth nerve function. EOMI otherwise. 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 Moving all 4 extremities well. No drift of the upper extremities.  CBC  Recent Labs  12/06/13 1408 12/07/13 0206  WBC 15.7* 13.6*  HGB 14.0 12.9*  HCT 42.0 39.5  PLT 236 217   BMET  Recent Labs  12/06/13 1408 12/07/13 0206  NA 141 137  K 3.3* 4.8  CL 100 100  CO2 24 25  GLUCOSE 125* 137*  BUN 15 16  CREATININE 0.96 0.72  CALCIUM 8.7 8.5    Studies/Results: Ct Angio Head W/cm &/or Wo Cm  12/06/2013   CLINICAL DATA:  Hit by forklift. Skull fractures. Rule out arterial injury.  EXAM: CT ANGIOGRAPHY HEAD AND NECK  TECHNIQUE: Multidetector CT imaging of the head and neck was performed using the standard protocol during bolus  administration of intravenous contrast. Multiplanar CT image reconstructions and MIPs were obtained to evaluate the vascular anatomy. Carotid stenosis measurements (when applicable) are obtained utilizing NASCET criteria, using the distal internal carotid diameter as the denominator.  CONTRAST:  50mL OMNIPAQUE IOHEXOL 350 MG/ML SOLN  COMPARISON:  CT the head and cervical spine earlier today  FINDINGS: CTA HEAD FINDINGS  Ventricle size is normal. No intracranial hemorrhage. There is pneumocephalus with gas in the right cavernous sinus and between the clivus and basilar and suprasellar cistern. This is compatible with basilar skull fractures. Bilateral mastoid sinus fractures are noted. There is gas throughout the deep soft tissues of the neck presumably related to the skullbase fractures. Air-fluid levels in the right maxillary and sphenoid sinuses.  Both vertebral arteries are normal. The basilar is normal. Cerebellar and posterior cerebral arteries are normal.  Cavernous carotid is normal bilaterally. No evidence of carotid dissection or injury. Petrous segment is normal. Anterior and middle cerebral arteries are normal. Negative for aneurysm.  Review of the MIP images confirms the above findings.  CTA NECK FINDINGS  Proximal great vessels are normal. No mass or adenopathy in the neck.  Carotid artery is normal bilaterally. Negative for carotid injury. No atherosclerotic disease or dissection.  Both vertebral arteries are equal in size and widely patent. No significant atherosclerotic disease or dissection.  Review of the MIP images  confirms the above findings.  IMPRESSION: Negative for arterial injury of the carotid or basilar artery bilaterally.  Skullbase fractures with pneumocephalus.   Electronically Signed   By: Marlan Palau M.D.   On: 12/06/2013 18:13   Ct Head Wo Contrast  12/07/2013   CLINICAL DATA:  Fork lift crush injury. 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  EXAM:  CT HEAD AND TEMPORAL BONES WITHOUT CONTRAST  TECHNIQUE: Contiguous axial images were obtained from the base of the skull through the vertex without contrast. Multidetector CT imaging of the temporal bones was performed using the standard protocol without intravenous contrast.  COMPARISON:  CT head performed yesterday. CT angio head neck also performed yesterday.  FINDINGS: CT HEAD FINDINGS  Continued suprasellar pneumocephalus status post basilar skull fracture. Air-fluid level with with hemorrhage RIGHT division sphenoid with pneumocephalus construct in the suprasellar cistern. No acute stroke or parenchymal hemorrhage. No midline shift or hydrocephalus. No extra-axial fluid. No calvarial fracture. Extensive air in the infratemporal extracranial soft tissues. Layering blood RIGHT maxillary sinus. BILATERAL ethmoid fluid/blood. Convexity pneumocephalus.  CT TEMPORAL BONES FINDINGS  There is a mixed longitudinal and transverse temporal bone fracture on the RIGHT with significant hemotympanum and blood layering in the mastoid air cells. Lateral wall mastoid fracture best seen on coronal imaging. Fracture extends into the posterior wall of the external auditory canal where there is significant blood lateral to the tympanic membrane. The middle ear cavity contains a moderate amount of blood inferiorly and anteriorly, with possible fracture of the scutum. There is disruption of the ossicular chain, with the head of the malleus displaced anteriorly and medially. There is disrupted tegmen tympani as seen on image 27 coronal, with possible fracture involving the horizontal and genicular segments of the RIGHT facial nerve canal.  No pneumolabyrinth, either the vestibule or cochlea. Air is seen in the oval window near the expected location of the stapes. No internal auditory canal fracture.  On the LEFT there is relatively minor changes of lateral mastoid wall fracture as seen on image 74 series 305. Ossicles are intact.  There is extensive cerumen or blood in the LEFT external canal. Intact internal ear structures.  IMPRESSION: Mixed longitudinal and transverse temporal bone fracture on the RIGHT with ossicular disruption. Vertical disruption of the tegmen tympani likely extends anteriorly to involve the facial nerve in its horizontal/geniculate segment. No apparent pneumolabyrinth.  Minor lateral wall mastoid fracture on the LEFT with layering fluid. External canal is filled with cerumen or blood, but middle ear and inner ear structures grossly intact.   Electronically Signed   By: Davonna Belling M.D.   On: 12/07/2013 11:34   Ct Head Wo Contrast  12/06/2013   CLINICAL DATA:  Patient was pinned between a forklift and rack at work, difficult hearing in both ears, lacerations to LEFT face and LEFT eye  EXAM: CT HEAD WITHOUT CONTRAST  CT MAXILLOFACIAL WITHOUT CONTRAST  CT CERVICAL SPINE WITHOUT CONTRAST  TECHNIQUE: Multidetector CT imaging of the head, cervical spine, and maxillofacial structures were performed using the standard protocol without intravenous contrast. Multiplanar CT image reconstructions of the cervical spine and maxillofacial structures were also generated. RIGHT-side of face marked with a BB.  COMPARISON:  None.  FINDINGS: CT HEAD FINDINGS  Normal ventricular morphology.  No midline shift or mass effect.  Normal appearance of brain parenchyma.  No intracranial hemorrhage, mass lesion or evidence acute infarction.  Air present at the cavernous sinuses bilaterally, in the sella  turcica, and suprasellar compatible with basilar skull fracture.  Partially opacified mastoid air cells bilaterally.  Air-fluid levels in RIGHT maxillary and sphenoid sinuses with partial ethmoid opacification bilaterally.  No calvarial fracture identified.  CT MAXILLOFACIAL FINDINGS  Air present within sella turcica, cavernous sinuses and suprasellar region.  Air-fluid level/hemorrhage within RIGHT sphenoid sinus and RIGHT maxillary sinus.   Partial opacification of ethmoid air cells bilaterally.  Small mucosal retention cyst LEFT maxillary sinus.  Frontal sinus and LEFT maxillary sinus otherwise clear.  Partial opacification of mastoid air cells bilaterally.  Alignment of the LEFT ossicles grossly normal.  Disruption of RIGHT ossicular chain with displacement of the malleus.  RIGHT temporal bone fracture extends to the RIGHT sphenoid across the lateral and anterior walls of the RIGHT sphenoid sinus.  No definite fractures of the orbits, maxillary sinuses or zygomas.  Soft tissue gas identified anterior and inferior to the mastoid air cells bilaterally and tracking inferiorly into the parapharyngeal spaces.  Gas is seen near the courses of the internal carotid arteries bilaterally at the carotid siphons but no discrete fracture planes involving the siphons are visualized.  Intraorbital soft tissue planes clear.  CT CERVICAL SPINE FINDINGS  Partial BILATERAL mastoid air cell opacification.  Prevertebral soft tissues normal thickness.  Minimal scattered disc space narrowing and endplate spur formation.  Vertebral body heights maintained.  No acute cervical spine fracture, subluxation, or bone destruction.  Lung apices clear.  IMPRESSION: No acute intracranial abnormalities.  Degenerative disc disease changes of the cervical spine.  No acute cervical spine abnormalities.  BILATERAL temporal bone fractures with partial opacification of the mastoid air cells bilaterally.  Disruption of the RIGHT ossicular chain.  Extension of RIGHT temporal fracture into the RIGHT sphenoid bone and traversing the RIGHT sphenoid sinus.  Though no definite involvement of the carotid canals is seen bilaterally, fracture planes particularly on the RIGHT are in close proximity to the carotid canals and if there is clinical concern for internal carotid injury or presence of neurologic impairment then consider CTA head to assess the internal carotid arteries at the skullbase.   Associated soft tissue gas caudal to the skullbase as well as intracranial gas at the cavernous sinuses, sella turcica and suprasellar regions.  Findings called to Dr. Modesto Charon on 12/06/2013 at 1650 hr.   Electronically Signed   By: Ulyses Southward M.D.   On: 12/06/2013 16:52   Ct Angio Neck W/cm &/or Wo/cm  12/06/2013   CLINICAL DATA:  Hit by forklift. Skull fractures. Rule out arterial injury.  EXAM: CT ANGIOGRAPHY HEAD AND NECK  TECHNIQUE: Multidetector CT imaging of the head and neck was performed using the standard protocol during bolus administration of intravenous contrast. Multiplanar CT image reconstructions and MIPs were obtained to evaluate the vascular anatomy. Carotid stenosis measurements (when applicable) are obtained utilizing NASCET criteria, using the distal internal carotid diameter as the denominator.  CONTRAST:  50mL OMNIPAQUE IOHEXOL 350 MG/ML SOLN  COMPARISON:  CT the head and cervical spine earlier today  FINDINGS: CTA HEAD FINDINGS  Ventricle size is normal. No intracranial hemorrhage. There is pneumocephalus with gas in the right cavernous sinus and between the clivus and basilar and suprasellar cistern. This is compatible with basilar skull fractures. Bilateral mastoid sinus fractures are noted. There is gas throughout the deep soft tissues of the neck presumably related to the skullbase fractures. Air-fluid levels in the right maxillary and sphenoid sinuses.  Both vertebral arteries are normal. The basilar is normal. Cerebellar and posterior cerebral  arteries are normal.  Cavernous carotid is normal bilaterally. No evidence of carotid dissection or injury. Petrous segment is normal. Anterior and middle cerebral arteries are normal. Negative for aneurysm.  Review of the MIP images confirms the above findings.  CTA NECK FINDINGS  Proximal great vessels are normal. No mass or adenopathy in the neck.  Carotid artery is normal bilaterally. Negative for carotid injury. No atherosclerotic disease or  dissection.  Both vertebral arteries are equal in size and widely patent. No significant atherosclerotic disease or dissection.  Review of the MIP images confirms the above findings.  IMPRESSION: Negative for arterial injury of the carotid or basilar artery bilaterally.  Skullbase fractures with pneumocephalus.   Electronically Signed   By: Marlan Palau M.D.   On: 12/06/2013 18:13   Ct Chest W Contrast  12/06/2013   CLINICAL DATA:  Trauma. Tenth between 4 clip in and around back. Severe right lower quadrant pain. Right arm limited range of motion.  EXAM: CT CHEST, ABDOMEN, AND PELVIS WITH CONTRAST  TECHNIQUE: Multidetector CT imaging of the chest, abdomen and pelvis was performed following the standard protocol during bolus administration of intravenous contrast.  CONTRAST:  90 cc Omnipaque 300 intravenous  COMPARISON:  None.  FINDINGS: CT CHEST FINDINGS  THORACIC INLET/BODY WALL:  No acute abnormality.  MEDIASTINUM:  Normal heart size. No pericardial effusion. No acute vascular abnormality. No adenopathy.  LUNG WINDOWS:  No contusion, hemothorax, or pneumothorax. Mild scattered atelectasis. Incidental air trapping in the apical left upper lobe; noted history of asthma.  OSSEOUS:  See below  CT ABDOMEN AND PELVIS FINDINGS  BODY WALL: Subcutaneous reticulation in the right lower lumbar region consistent with contusion.  Liver: No focal abnormality.  Biliary: No evidence of biliary obstruction or stone.  Pancreas: Unremarkable.  Spleen: Unremarkable.  Adrenals: Unremarkable.  Kidneys and ureters: No evidence of injury.  Bladder: Unremarkable.  Reproductive: Unremarkable.  Bowel: No evidence of injury. Extensive distal colonic diverticulosis.  Retroperitoneum: No mass or adenopathy.  Peritoneum: No free fluid or gas.  Vascular: No acute findings.  OSSEOUS: No findings to explain right shoulder symptoms. Note that the upper scapula, humerus, and distal clavicle is not visualized.  L1 through L4 right transverse  process fractures with up to moderate displacement/distraction. There is associated hemorrhage within the right quadratus lumborum and neighboring deep back muscles. No measurable hematoma or evidence of active hemorrhage.  IMPRESSION: 1. L1 through L4 right transverse process fractures. 2. No acute intrathoracic or intra-abdominal findings. 3. Extensive colonic diverticulosis.   Electronically Signed   By: Tiburcio Pea M.D.   On: 12/06/2013 16:04   Ct Cervical Spine Wo Contrast  12/06/2013   CLINICAL DATA:  Patient was pinned between a forklift and rack at work, difficult hearing in both ears, lacerations to LEFT face and LEFT eye  EXAM: CT HEAD WITHOUT CONTRAST  CT MAXILLOFACIAL WITHOUT CONTRAST  CT CERVICAL SPINE WITHOUT CONTRAST  TECHNIQUE: Multidetector CT imaging of the head, cervical spine, and maxillofacial structures were performed using the standard protocol without intravenous contrast. Multiplanar CT image reconstructions of the cervical spine and maxillofacial structures were also generated. RIGHT-side of face marked with a BB.  COMPARISON:  None.  FINDINGS: CT HEAD FINDINGS  Normal ventricular morphology.  No midline shift or mass effect.  Normal appearance of brain parenchyma.  No intracranial hemorrhage, mass lesion or evidence acute infarction.  Air present at the cavernous sinuses bilaterally, in the sella turcica, and suprasellar compatible with basilar skull fracture.  Partially opacified mastoid air cells bilaterally.  Air-fluid levels in RIGHT maxillary and sphenoid sinuses with partial ethmoid opacification bilaterally.  No calvarial fracture identified.  CT MAXILLOFACIAL FINDINGS  Air present within sella turcica, cavernous sinuses and suprasellar region.  Air-fluid level/hemorrhage within RIGHT sphenoid sinus and RIGHT maxillary sinus.  Partial opacification of ethmoid air cells bilaterally.  Small mucosal retention cyst LEFT maxillary sinus.  Frontal sinus and LEFT maxillary sinus  otherwise clear.  Partial opacification of mastoid air cells bilaterally.  Alignment of the LEFT ossicles grossly normal.  Disruption of RIGHT ossicular chain with displacement of the malleus.  RIGHT temporal bone fracture extends to the RIGHT sphenoid across the lateral and anterior walls of the RIGHT sphenoid sinus.  No definite fractures of the orbits, maxillary sinuses or zygomas.  Soft tissue gas identified anterior and inferior to the mastoid air cells bilaterally and tracking inferiorly into the parapharyngeal spaces.  Gas is seen near the courses of the internal carotid arteries bilaterally at the carotid siphons but no discrete fracture planes involving the siphons are visualized.  Intraorbital soft tissue planes clear.  CT CERVICAL SPINE FINDINGS  Partial BILATERAL mastoid air cell opacification.  Prevertebral soft tissues normal thickness.  Minimal scattered disc space narrowing and endplate spur formation.  Vertebral body heights maintained.  No acute cervical spine fracture, subluxation, or bone destruction.  Lung apices clear.  IMPRESSION: No acute intracranial abnormalities.  Degenerative disc disease changes of the cervical spine.  No acute cervical spine abnormalities.  BILATERAL temporal bone fractures with partial opacification of the mastoid air cells bilaterally.  Disruption of the RIGHT ossicular chain.  Extension of RIGHT temporal fracture into the RIGHT sphenoid bone and traversing the RIGHT sphenoid sinus.  Though no definite involvement of the carotid canals is seen bilaterally, fracture planes particularly on the RIGHT are in close proximity to the carotid canals and if there is clinical concern for internal carotid injury or presence of neurologic impairment then consider CTA head to assess the internal carotid arteries at the skullbase.  Associated soft tissue gas caudal to the skullbase as well as intracranial gas at the cavernous sinuses, sella turcica and suprasellar regions.   Findings called to Dr. Modesto Charon on 12/06/2013 at 1650 hr.   Electronically Signed   By: Ulyses Southward M.D.   On: 12/06/2013 16:52   Ct Abdomen Pelvis W Contrast  12/06/2013   CLINICAL DATA:  Trauma. Tenth between 4 clip in and around back. Severe right lower quadrant pain. Right arm limited range of motion.  EXAM: CT CHEST, ABDOMEN, AND PELVIS WITH CONTRAST  TECHNIQUE: Multidetector CT imaging of the chest, abdomen and pelvis was performed following the standard protocol during bolus administration of intravenous contrast.  CONTRAST:  90 cc Omnipaque 300 intravenous  COMPARISON:  None.  FINDINGS: CT CHEST FINDINGS  THORACIC INLET/BODY WALL:  No acute abnormality.  MEDIASTINUM:  Normal heart size. No pericardial effusion. No acute vascular abnormality. No adenopathy.  LUNG WINDOWS:  No contusion, hemothorax, or pneumothorax. Mild scattered atelectasis. Incidental air trapping in the apical left upper lobe; noted history of asthma.  OSSEOUS:  See below  CT ABDOMEN AND PELVIS FINDINGS  BODY WALL: Subcutaneous reticulation in the right lower lumbar region consistent with contusion.  Liver: No focal abnormality.  Biliary: No evidence of biliary obstruction or stone.  Pancreas: Unremarkable.  Spleen: Unremarkable.  Adrenals: Unremarkable.  Kidneys and ureters: No evidence of injury.  Bladder: Unremarkable.  Reproductive: Unremarkable.  Bowel: No evidence of injury.  Extensive distal colonic diverticulosis.  Retroperitoneum: No mass or adenopathy.  Peritoneum: No free fluid or gas.  Vascular: No acute findings.  OSSEOUS: No findings to explain right shoulder symptoms. Note that the upper scapula, humerus, and distal clavicle is not visualized.  L1 through L4 right transverse process fractures with up to moderate displacement/distraction. There is associated hemorrhage within the right quadratus lumborum and neighboring deep back muscles. No measurable hematoma or evidence of active hemorrhage.  IMPRESSION: 1. L1 through L4  right transverse process fractures. 2. No acute intrathoracic or intra-abdominal findings. 3. Extensive colonic diverticulosis.   Electronically Signed   By: Tiburcio Pea M.D.   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 Facial trauma.  EXAM: PORTABLE PELVIS 1-2 VIEWS  COMPARISON:  None.  FINDINGS: There is no evidence of pelvic fracture or dislocation. There are mild degenerative joint changes with narrowed bilateral hip joint spaces.  IMPRESSION: No acute fracture or dislocation.   Electronically Signed   By: Sherian Rein M.D.   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 Facial trauma Left eyelid, missing tooth top left and Right ear. LOC after intial accident, back at baseline. Back pain.  EXAM: PORTABLE CHEST - 1 VIEW  COMPARISON:  None.  FINDINGS: The heart size and mediastinal contours are within normal limits. Both lungs are clear. The visualized skeletal structures are unremarkable.  IMPRESSION: No active disease.   Electronically Signed   By: Elige Ko   On: 12/06/2013 15:00   Ct Maxillofacial Wo Cm  12/06/2013   CLINICAL DATA:  Patient was pinned between a forklift and rack at work, difficult hearing in both ears, lacerations to LEFT face and LEFT eye  EXAM: CT HEAD WITHOUT CONTRAST  CT MAXILLOFACIAL WITHOUT CONTRAST  CT CERVICAL SPINE WITHOUT CONTRAST  TECHNIQUE: Multidetector CT imaging of the head, cervical spine, and maxillofacial structures were performed using the standard protocol without intravenous contrast. Multiplanar CT image reconstructions of the cervical spine and maxillofacial structures were also generated. RIGHT-side of face marked with a BB.  COMPARISON:  None.  FINDINGS: CT HEAD FINDINGS  Normal ventricular morphology.  No midline shift or mass  effect.  Normal appearance of brain parenchyma.  No intracranial hemorrhage, mass lesion or evidence acute infarction.  Air present at the cavernous sinuses bilaterally, in the sella turcica, and suprasellar compatible with basilar skull fracture.  Partially opacified mastoid air cells bilaterally.  Air-fluid levels in RIGHT maxillary and sphenoid sinuses with partial ethmoid opacification bilaterally.  No calvarial fracture identified.  CT MAXILLOFACIAL FINDINGS  Air present within sella turcica, cavernous sinuses and suprasellar region.  Air-fluid level/hemorrhage within RIGHT sphenoid sinus and RIGHT maxillary sinus.  Partial opacification of ethmoid air cells bilaterally.  Small mucosal retention cyst LEFT maxillary sinus.  Frontal sinus and LEFT maxillary sinus otherwise clear.  Partial opacification of mastoid air cells bilaterally.  Alignment of the LEFT ossicles grossly normal.  Disruption of RIGHT ossicular chain with displacement of the malleus.  RIGHT temporal bone fracture extends to the RIGHT sphenoid across the lateral and anterior walls of the RIGHT sphenoid sinus.  No definite fractures of the orbits, maxillary sinuses or zygomas.  Soft tissue gas identified anterior and inferior to the mastoid air  cells bilaterally and tracking inferiorly into the parapharyngeal spaces.  Gas is seen near the courses of the internal carotid arteries bilaterally at the carotid siphons but no discrete fracture planes involving the siphons are visualized.  Intraorbital soft tissue planes clear.  CT CERVICAL SPINE FINDINGS  Partial BILATERAL mastoid air cell opacification.  Prevertebral soft tissues normal thickness.  Minimal scattered disc space narrowing and endplate spur formation.  Vertebral body heights maintained.  No acute cervical spine fracture, subluxation, or bone destruction.  Lung apices clear.  IMPRESSION: No acute intracranial abnormalities.  Degenerative disc disease changes of the cervical spine.  No  acute cervical spine abnormalities.  BILATERAL temporal bone fractures with partial opacification of the mastoid air cells bilaterally.  Disruption of the RIGHT ossicular chain.  Extension of RIGHT temporal fracture into the RIGHT sphenoid bone and traversing the RIGHT sphenoid sinus.  Though no definite involvement of the carotid canals is seen bilaterally, fracture planes particularly on the RIGHT are in close proximity to the carotid canals and if there is clinical concern for internal carotid injury or presence of neurologic impairment then consider CTA head to assess the internal carotid arteries at the skullbase.  Associated soft tissue gas caudal to the skullbase as well as intracranial gas at the cavernous sinuses, sella turcica and suprasellar regions.  Findings called to Dr. Modesto CharonWong on 12/06/2013 at 1650 hr.   Electronically Signed   By: Ulyses SouthwardMark  Boles M.D.   On: 12/06/2013 16:52   Ct Temporal Bones W/o Cm  12/07/2013   CLINICAL DATA:  Fork lift crush injury. 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  EXAM: CT HEAD AND TEMPORAL BONES WITHOUT CONTRAST  TECHNIQUE: Contiguous axial images were obtained from the base of the skull through the vertex without contrast. Multidetector CT imaging of the temporal bones was performed using the standard protocol without intravenous contrast.  COMPARISON:  CT head performed yesterday. CT angio head neck also performed yesterday.  FINDINGS: CT HEAD FINDINGS  Continued suprasellar pneumocephalus status post basilar skull fracture. Air-fluid level with with hemorrhage RIGHT division sphenoid with pneumocephalus construct in the suprasellar cistern. No acute stroke or parenchymal hemorrhage. No midline shift or hydrocephalus. No extra-axial fluid. No calvarial fracture. Extensive air in the infratemporal extracranial soft tissues. Layering blood RIGHT maxillary sinus. BILATERAL ethmoid fluid/blood. Convexity pneumocephalus.  CT TEMPORAL BONES  FINDINGS  There is a mixed longitudinal and transverse temporal bone fracture on the RIGHT with significant hemotympanum and blood layering in the mastoid air cells. Lateral wall mastoid fracture best seen on coronal imaging. Fracture extends into the posterior wall of the external auditory canal where there is significant blood lateral to the tympanic membrane. The middle ear cavity contains a moderate amount of blood inferiorly and anteriorly, with possible fracture of the scutum. There is disruption of the ossicular chain, with the head of the malleus displaced anteriorly and medially. There is disrupted tegmen tympani as seen on image 27 coronal, with possible fracture involving the horizontal and genicular segments of the RIGHT facial nerve canal.  No pneumolabyrinth, either the vestibule or cochlea. Air is seen in the oval window near the expected location of the stapes. No internal auditory canal fracture.  On the LEFT there is relatively minor changes of lateral mastoid wall fracture as seen on image 74 series 305. Ossicles are intact. There is extensive cerumen or blood in the LEFT external canal. Intact internal ear structures.  IMPRESSION: Mixed longitudinal and transverse temporal bone  fracture on the RIGHT with ossicular disruption. Vertical disruption of the tegmen tympani likely extends anteriorly to involve the facial nerve in its horizontal/geniculate segment. No apparent pneumolabyrinth.  Minor lateral wall mastoid fracture on the LEFT with layering fluid. External canal is filled with cerumen or blood, but middle ear and inner ear structures grossly intact.   Electronically Signed   By: Davonna BellingJohn  Curnes M.D.   On: 12/07/2013 11:34    Assessment/Plan: Neurologically stable. Some recovery of right sixth nerve function. We'll need another follow-up CT of the head in a few days.   Hewitt ShortsNUDELMAN,ROBERT W, MD 12/08/2013, 1:55 PM

## 2013-12-08 NOTE — Progress Notes (Signed)
Occupational Therapy Treatment Patient Details Name: Michael GreeningJames Mendolia MRN: [id number] DOB: 1960/09/14 Today's Date: 12/08/2013    History of present illness Pt s/p forklift accident with resulting B temporal fxs, R sphenoid fx, R ear ossicle fx - associated CN VI and VII deficits on R. R ear and L eyelid laceration. L1-4 TVP fxs   OT comments  Pt making steady progress. States the "dizziness" is getting better. Pt expressed concerns that he is not able to remember things. Pt seen this pm to further assess functional vision. Apparent deficits with abducting R eye with resulting horizontal diplopia. Used occlusion taping on glasses for diplopia. Pt prefers glasses over patch. Began gaze stabilization and occulomotor exercises.  Discussed mobility needs and precautions with nurse. Wife asking if eye drops can be kept in room - nursing notified. Will continue to follow acutely to address established goals.  Follow Up Recommendations  CIR;Supervision/Assistance - 24 hour    Equipment Recommendations  3 in 1 bedside comode;Tub/shower bench    Recommendations for Other Services Rehab consult    Precautions / Restrictions Precautions Precautions: Fall Precaution Comments: vertigo symptoms with increased head movement Restrictions Weight Bearing Restrictions: No       Mobility   Balance    ADL                                                Vision Eye Alignment: Impaired (comment) Alignment/Gaze Preference: Head tilt;Head turned Ocular Range of Motion: Restricted on the right Tracking/Visual Pursuits: Right eye does not track laterally;Decreased smoothness of eye movement to RIGHT superior field;Decreased smoothness of eye movement to RIGHT inferior field;Decreased smoothness of horizontal tracking Saccades: Additional head turns occurred during testing   Diplopia Assessment: Disappears with one eye closed;Objects split side to side;Only with right gaze (primarily;  able to see 1 imae in L gaze to about nasal field) Depth Perception: Undershoots Additional Comments: Pt had been using eye patch alternating q 2 hours. Today, used occlusion taping to eliminate double image. Pt prefers occlusion taping. Reviewed gaze stabilization exercise with pt/wife and visual fixation on target without head movement. Also worked with visual tracking into R field using binocular vision and R monocular vision.   Perception     Praxis      Cognition   Behavior During Therapy: WFL for tasks assessed/performed Overall Cognitive Status: Difficult to assess       Memory: Decreased short-term memory               Extremity/Trunk Assessment  Upper Extremity Assessment Upper Extremity Assessment: Defer to OT evaluation   Lower Extremity Assessment Lower Extremity Assessment: Overall WFL for tasks assessed   Cervical / Trunk Assessment Cervical / Trunk Assessment: Other exceptions (L1-4 TVP fxs)    Exercises Other Exercises Other Exercises: gaze stabilization Other Exercises: visual tracking Other Exercises: R glasses lens occluded at midline    Shoulder Instructions       General Comments      Pertinent Vitals/ Pain       Pain Assessment: 0-10 Pain Score: 4  Pain Location: eyes; back Pain Descriptors / Indicators: Aching;Burning Pain Intervention(s): Limited activity within patient's tolerance;Monitored during session  Home Living Family/patient expects to be discharged to:: Private residence Living Arrangements: Spouse/significant other Available Help at Discharge: Family Type of Home: House Home Access: Stairs to enter Secretary/administratorntrance Stairs-Number  of Steps: 2   Home Layout: Two level;Bed/bath upstairs Alternate Level Stairs-Number of Steps: flight Alternate Level Stairs-Rails: Right           Home Equipment: None          Prior Functioning/Environment Level of Independence: Independent        Comments: working   Frequency Min  3X/week     Progress Toward Goals  OT Goals(current goals can now be found in the care plan section)  Progress towards OT goals: Progressing toward goals  Acute Rehab OT Goals Patient Stated Goal: to not feel sick OT Goal Formulation: With patient Time For Goal Achievement: 12/21/13 Potential to Achieve Goals: Good ADL Goals Pt Will Perform Grooming: with set-up;sitting Pt Will Perform Lower Body Bathing: with set-up;with supervision;with caregiver independent in assisting;sit to/from stand;with adaptive equipment Pt Will Perform Lower Body Dressing: with supervision;with set-up;with caregiver independent in assisting;with adaptive equipment;sit to/from stand Pt Will Transfer to Toilet: with supervision;ambulating Pt Will Perform Toileting - Clothing Manipulation and hygiene: with modified independence;sit to/from stand Additional ADL Goal #1: complete vsual gaze stabilization exercises to improve ability to complete ADL and mobility  Plan Discharge plan remains appropriate    Co-evaluation                 End of Session     Activity Tolerance Patient tolerated treatment well   Patient Left in chair;with call bell/phone within reach;with family/visitor present   Nurse Communication Mobility status;Precautions;Other (comment) (use of glasses and need for minimizing head movement during )        Time: [id number] OT Time Calculation (min): 24 min  Charges: OT General Charges $OT Visit: 1 Procedure OT Treatments $Therapeutic Activity: 23-37 mins  Flossie Wexler,HILLARY 12/08/2013, 4:31 PM   [facility] Lucyle Alumbaugh, OTR/L  [phone] 12/08/2013

## 2013-12-08 NOTE — Progress Notes (Signed)
Rehab Admissions Coordinator Note:  Patient was screened by Trish MageLogue, Tunisha Ruland M for appropriateness for an Inpatient Acute Rehab Consult.  At this time, we are recommending Inpatient Rehab consult.  Trish MageLogue, Carol Theys M 12/08/2013, 8:39 AM  I can be reached at [phone].

## 2013-12-08 NOTE — Evaluation (Signed)
Physical Therapy Evaluation Patient Details Name: Michael GreeningJames Bogdon MRN: [id number] DOB: [birth year] Today's Date: 12/08/2013   History of Present Illness  Pt s/p forklift accident with resulting B temporal fxs, R sphenoid fx, R ear ossicle fx - associated CN VI and VII deficits on R. R ear and L eyelid laceration. L1-4 TVP fxs  Clinical Impression  Pt very eager to work on mobility and return to baseline.  Pt today indicates less dizziness than yesterday, though note pt is on Antivert.  Pt utilized eye patch during session and had no episodes of diplopia.  Worked on mobility with gaze stabilization and without gaze stabilization.  Pt able to perform mobility as long as he maintains a neutral head position.  Feel pt would benefit from CIR at D/C to maximize independence and decrease burden of care.  Will continue to follow.      Follow Up Recommendations CIR    Equipment Recommendations   (TBD)    Recommendations for Other Services Rehab consult     Precautions / Restrictions Precautions Precautions: Fall Precaution Comments: vertigo symptoms with increased head movement Restrictions Weight Bearing Restrictions: No      Mobility  Bed Mobility Overal bed mobility: Needs Assistance Bed Mobility: Rolling;Sidelying to Sit Rolling: Supervision Sidelying to sit: Min assist       General bed mobility comments: improved participation with mobility with decreased c/o dizziness and decreased c/o pain  Transfers Overall transfer level: Needs assistance Equipment used: Rolling walker (2 wheeled) Transfers: Sit to/from Stand Sit to Stand: Min assist Stand pivot transfers: Min assist       General transfer comment: improved ability to complete mobility today with decreased c/o dizziness.  pt worked on stabilizing gaze during transfer.    Ambulation/Gait Ambulation/Gait assistance: Min assist Ambulation Distance (Feet): 70 Feet Assistive device: Rolling walker (2 wheeled) Gait  Pattern/deviations: Step-through pattern;Decreased stride length     General Gait Details: During first half of gait pt utilized "A" taped to IV pole to stabilize gaze.  2nd person managed IV pole and lines/leads.  During second half of gait pt did not utilize gaze stabilization, but maintained a neutral head position with pt indicating slight increased dizziness than when using "A", but improved from yesterday.    Stairs            Wheelchair Mobility    Modified Rankin (Stroke Patients Only)       Balance Overall balance assessment: Needs assistance Sitting-balance support: No upper extremity supported;Feet supported Sitting balance-Leahy Scale: Fair     Standing balance support: During functional activity;Bilateral upper extremity supported Standing balance-Leahy Scale: Poor                               Pertinent Vitals/Pain Pain Assessment: 0-10 Pain Score: 4  Pain Location: Back Pain Descriptors / Indicators: Burning Pain Intervention(s): Limited activity within patient's tolerance;Monitored during session    Home Living Family/patient expects to be discharged to:: Private residence Living Arrangements: Spouse/significant other Available Help at Discharge: Family Type of Home: House Home Access: Stairs to enter   Secretary/administratorntrance Stairs-Number of Steps: 2 Home Layout: Two level;Bed/bath upstairs Home Equipment: None      Prior Function Level of Independence: Independent         Comments: working     Higher education careers adviserHand Dominance   Dominant Hand: Right    Extremity/Trunk Assessment   Upper Extremity Assessment: Defer to OT evaluation  Lower Extremity Assessment: Overall WFL for tasks assessed      Cervical / Trunk Assessment: Other exceptions (L1-4 TVP fxs)  Communication   Communication: HOH (decreased hearing R ear)  Cognition Arousal/Alertness: Awake/alert Behavior During Therapy: WFL for tasks assessed/performed Overall Cognitive  Status: Within Functional Limits for tasks assessed                      General Comments      Exercises        Assessment/Plan    PT Assessment Patient needs continued PT services  PT Diagnosis Difficulty walking   PT Problem List Decreased activity tolerance;Decreased balance;Decreased mobility;Decreased coordination;Decreased knowledge of use of DME  PT Treatment Interventions DME instruction;Gait training;Stair training;Functional mobility training;Therapeutic activities;Therapeutic exercise;Balance training;Neuromuscular re-education;Patient/family education   PT Goals (Current goals can be found in the Care Plan section) Acute Rehab PT Goals Patient Stated Goal: to not feel sick PT Goal Formulation: With patient Time For Goal Achievement: 12/22/13 Potential to Achieve Goals: Good    Frequency Min 3X/week   Barriers to discharge        Co-evaluation               End of Session Equipment Utilized During Treatment: Gait belt Activity Tolerance: Patient tolerated treatment well Patient left: in chair;with call bell/phone within reach;with family/visitor present Nurse Communication: Mobility status         Time: [id number] PT Time Calculation (min) (ACUTE ONLY): 26 min   Charges:   PT Evaluation $Initial PT Evaluation Tier I: 1 Procedure     PT G CodesSunny Schlein:          Ramya Vanbergen F, [place]PT [phone] 12/08/2013, 2:32 PM

## 2013-12-08 NOTE — Consult Note (Signed)
Physical Medicine and Rehabilitation Consult  Reason for Consult: TBI, difficulty hearing due to ossicular discontinuity, CN VII nerve palsy and diplopia Referring Physician: Dr. Lindie Spruce   HPI: Michael Hooper is a 53 y.o. 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 He had brief LOC and was evaluated at [facility] on 12/06/13. Work up revealed bilateral temporal bone fractures with extension of right temporal fracture into right sphenoid bone transversing the right sphenoid sinus, disruption of right ossicular chain, basilar skull fracture, L1-L4 transverse process fractures with moderate displacement and associated hemorrhage right quadratus lumborum and neighboring muscles. CTA head/neck negative for carotid or basilar artery injury. Dr. Jule Ser consulted for input and recommended ENT evaluation for Right VI nerve palsy, Right VII peripheral nerve palsy and loss of hearing in the right ear. Conservative management with monitoring for complications/CSF leak recommended. Dr. Kelly Splinter consulted and repaired right ear full thickness laceration and left upper eye lid stellate laceration past admission. Patient with reports of blurred and double vision right eye with vertigo and decreased hearing and complete right peripheral facial paralysis per Dr. Lazarus Salines. He consulted Dr. Delfino Lovett at [facility] who recommended monitoring for spontaneous recovery with consideration for decompression of facial nerve and Ossiculoplasty later when stabilized. Patient started on high dose steroids for treatment as well as taping OD at nights--to follow up with ENT in a week and Opth consult recommended for diplopia.  Therapy evaluations done yesterday and patient limited by back pain as well as dizziness with diplopia. MD and therapy team recommending CIR for progressive therapy.    Review of Systems  HENT: Positive for  hearing loss. Negative for tinnitus.   Eyes: Positive for blurred vision and double vision.       Right eye discomfort.  Respiratory: Negative for cough and shortness of breath.   Cardiovascular: Negative for chest pain and palpitations.  Gastrointestinal: Negative for heartburn, nausea and abdominal pain.  Genitourinary: Negative for dysuria and urgency.  Musculoskeletal: Positive for myalgias and back pain.  Neurological: Positive for dizziness, sensory change and headaches.  Psychiatric/Behavioral: The patient is nervous/anxious. The patient does not have insomnia.       Past Medical History  Diagnosis Date  . Asthma    History reviewed. No pertinent past surgical history. Family History  Problem Relation Age of Onset  . Heart failure Mother   . Heart failure Father   . Asthma Brother   . Parkinson's disease Father   . Emphysema Mother     Social History: Married and independent PTA. Wife with multiple health problems. He  reports that he has never smoked. He does not have any smokeless tobacco history on file. He reports that he drinks about 2.4 oz of alcohol per week. He reports that he does not use illicit drugs.    Allergies: No Known Allergies    No prescriptions prior to admission    Home: Home Living Family/patient expects to be discharged to:: Private residence Living Arrangements: Spouse/significant other Available Help at Discharge: Family Type of Home: House Home Access: Stairs to enter Secretary/administrator of Steps: 2 Home Layout: Two level, Bed/bath upstairs Alternate Level Stairs-Number of Steps: flight Home Equipment: None  Functional History: Prior Function Level of Independence: Independent Comments: working Functional Status:  Mobility: Bed Mobility Overal bed mobility: Needs Assistance Bed Mobility: Rolling, Sidelying to Sit Rolling: Supervision Sidelying to  sit: Min assist General bed mobility comments: improved participation with  mobility with decreased c/o dizziness and decreased c/o pain Transfers Overall transfer level: Needs assistance Equipment used: Rolling walker (2 wheeled) Transfers: Sit to/from Stand, Stand Pivot Transfers Sit to Stand: Min assist Stand pivot transfers: Min assist General transfer comment: improved ability to complete mobility today with decreased c/o dizziness      ADL: ADL Overall ADL's : Needs assistance/impaired Grooming: Minimal assistance Grooming Details (indicate cue type and reason): difficulty due to pain,vertigo and facial weakness Upper Body Bathing: Minimal assitance, Bed level Lower Body Bathing: Maximal assistance, Sit to/from stand Upper Body Dressing : Moderate assistance, Bed level Lower Body Dressing: Maximal assistance, Sit to/from stand Functional mobility during ADLs: Minimal assistance, +2 for safety/equipment General ADL Comments: mobilizing with R eye occluded. Educated on using gaze stabilization during mobility to decrease symptoms of vertigo  Cognition: Cognition Overall Cognitive Status:  (appears WFL. will further assess) Orientation Level: Oriented X4 Cognition Arousal/Alertness: Awake/alert Behavior During Therapy: WFL for tasks assessed/performed Overall Cognitive Status:  (appears WFL. will further assess)  Blood pressure 116/77, pulse 70, temperature 98.7 F (37.1 C), temperature source Axillary, resp. rate 8, height 5\' 10"  (1.778 m), weight 114.7 kg (252 lb 13.9 oz), SpO2 97 %. Physical Exam  Nursing note and vitals reviewed. Constitutional: He is oriented to person, place, and time. He appears well-developed and well-nourished.  HENT:  Head: Normocephalic.  Dressing right ear.  Eyes: Pupils are equal, round, and reactive to light. Right conjunctiva is injected. Right eye exhibits nystagmus. Left eye exhibits nystagmus.  Left lid with laceration sutured.    Neck: Normal range of motion. Neck supple.  Cardiovascular: Normal rate and  regular rhythm.   Respiratory: Effort normal and breath sounds normal. No respiratory distress. He has no wheezes.  GI: Soft. Bowel sounds are normal. He exhibits no distension. There is no tenderness.  Musculoskeletal: He exhibits no edema or tenderness.  Neurological: He is alert and oriented to person, place, and time.  Speech clear. Follows commands without difficulty.  Unable to hear right ear. Right facial paresis. Unable to move right eye laterally and  horizontal nystagmus noted. Moves all four extremities equally. No sensory deficits.   Skin: Skin is warm and dry.  Ecchymotic areas mid lumbar spine  Psychiatric: He has a normal mood and affect. His behavior is normal. Judgment and thought content normal.    No results found for this or any previous visit (from the past 24 hour(s)). Ct Angio Head W/cm &/or Wo Cm  12/06/2013   CLINICAL DATA:  Hit by forklift. Skull fractures. Rule out arterial injury.  EXAM: CT ANGIOGRAPHY HEAD AND NECK  TECHNIQUE: Multidetector CT imaging of the head and neck was performed using the standard protocol during bolus administration of intravenous contrast. Multiplanar CT image reconstructions and MIPs were obtained to evaluate the vascular anatomy. Carotid stenosis measurements (when applicable) are obtained utilizing NASCET criteria, using the distal internal carotid diameter as the denominator.  CONTRAST:  50mL OMNIPAQUE IOHEXOL 350 MG/ML SOLN  COMPARISON:  CT the head and cervical spine earlier today  FINDINGS: CTA HEAD FINDINGS  Ventricle size is normal. No intracranial hemorrhage. There is pneumocephalus with gas in the right cavernous sinus and between the clivus and basilar and suprasellar cistern. This is compatible with basilar skull fractures. Bilateral mastoid sinus fractures are noted. There is gas throughout the deep soft tissues of the neck presumably related to the skullbase fractures. Air-fluid levels in the  right maxillary and sphenoid sinuses.   Both vertebral arteries are normal. The basilar is normal. Cerebellar and posterior cerebral arteries are normal.  Cavernous carotid is normal bilaterally. No evidence of carotid dissection or injury. Petrous segment is normal. Anterior and middle cerebral arteries are normal. Negative for aneurysm.  Review of the MIP images confirms the above findings.  CTA NECK FINDINGS  Proximal great vessels are normal. No mass or adenopathy in the neck.  Carotid artery is normal bilaterally. Negative for carotid injury. No atherosclerotic disease or dissection.  Both vertebral arteries are equal in size and widely patent. No significant atherosclerotic disease or dissection.  Review of the MIP images confirms the above findings.  IMPRESSION: Negative for arterial injury of the carotid or basilar artery bilaterally.  Skullbase fractures with pneumocephalus.   Electronically Signed   By: Marlan Palau M.D.   On: 12/06/2013 18:13    Ct Head Wo Contrast  12/06/2013   CLINICAL DATA:  Patient was pinned between a forklift and rack at work, difficult hearing in both ears, lacerations to LEFT face and LEFT eye  EXAM: CT HEAD WITHOUT CONTRAST  CT MAXILLOFACIAL WITHOUT CONTRAST  CT CERVICAL SPINE WITHOUT CONTRAST  TECHNIQUE: Multidetector CT imaging of the head, cervical spine, and maxillofacial structures were performed using the standard protocol without intravenous contrast. Multiplanar CT image reconstructions of the cervical spine and maxillofacial structures were also generated. RIGHT-side of face marked with a BB.  COMPARISON:  None.  FINDINGS: CT HEAD FINDINGS  Normal ventricular morphology.  No midline shift or mass effect.  Normal appearance of brain parenchyma.  No intracranial hemorrhage, mass lesion or evidence acute infarction.  Air present at the cavernous sinuses bilaterally, in the sella turcica, and suprasellar compatible with basilar skull fracture.  Partially opacified mastoid air cells bilaterally.  Air-fluid  levels in RIGHT maxillary and sphenoid sinuses with partial ethmoid opacification bilaterally.  No calvarial fracture identified.  CT MAXILLOFACIAL FINDINGS  Air present within sella turcica, cavernous sinuses and suprasellar region.  Air-fluid level/hemorrhage within RIGHT sphenoid sinus and RIGHT maxillary sinus.  Partial opacification of ethmoid air cells bilaterally.  Small mucosal retention cyst LEFT maxillary sinus.  Frontal sinus and LEFT maxillary sinus otherwise clear.  Partial opacification of mastoid air cells bilaterally.  Alignment of the LEFT ossicles grossly normal.  Disruption of RIGHT ossicular chain with displacement of the malleus.  RIGHT temporal bone fracture extends to the RIGHT sphenoid across the lateral and anterior walls of the RIGHT sphenoid sinus.  No definite fractures of the orbits, maxillary sinuses or zygomas.  Soft tissue gas identified anterior and inferior to the mastoid air cells bilaterally and tracking inferiorly into the parapharyngeal spaces.  Gas is seen near the courses of the internal carotid arteries bilaterally at the carotid siphons but no discrete fracture planes involving the siphons are visualized.  Intraorbital soft tissue planes clear.  CT CERVICAL SPINE FINDINGS  Partial BILATERAL mastoid air cell opacification.  Prevertebral soft tissues normal thickness.  Minimal scattered disc space narrowing and endplate spur formation.  Vertebral body heights maintained.  No acute cervical spine fracture, subluxation, or bone destruction.  Lung apices clear.  IMPRESSION: No acute intracranial abnormalities.  Degenerative disc disease changes of the cervical spine.  No acute cervical spine abnormalities.  BILATERAL temporal bone fractures with partial opacification of the mastoid air cells bilaterally.  Disruption of the RIGHT ossicular chain.  Extension of RIGHT temporal fracture into the RIGHT sphenoid bone and traversing the RIGHT sphenoid  sinus.  Though no definite  involvement of the carotid canals is seen bilaterally, fracture planes particularly on the RIGHT are in close proximity to the carotid canals and if there is clinical concern for internal carotid injury or presence of neurologic impairment then consider CTA head to assess the internal carotid arteries at the skullbase.  Associated soft tissue gas caudal to the skullbase as well as intracranial gas at the cavernous sinuses, sella turcica and suprasellar regions.  Findings called to Dr. Modesto Charon on 12/06/2013 at 1650 hr.   Electronically Signed   By: Ulyses Southward M.D.   On: 12/06/2013 16:52   Ct Angio Neck W/cm &/or Wo/cm  12/06/2013   CLINICAL DATA:  Hit by forklift. Skull fractures. Rule out arterial injury.  EXAM: CT ANGIOGRAPHY HEAD AND NECK  TECHNIQUE: Multidetector CT imaging of the head and neck was performed using the standard protocol during bolus administration of intravenous contrast. Multiplanar CT image reconstructions and MIPs were obtained to evaluate the vascular anatomy. Carotid stenosis measurements (when applicable) are obtained utilizing NASCET criteria, using the distal internal carotid diameter as the denominator.  CONTRAST:  50mL OMNIPAQUE IOHEXOL 350 MG/ML SOLN  COMPARISON:  CT the head and cervical spine earlier today  FINDINGS: CTA HEAD FINDINGS  Ventricle size is normal. No intracranial hemorrhage. There is pneumocephalus with gas in the right cavernous sinus and between the clivus and basilar and suprasellar cistern. This is compatible with basilar skull fractures. Bilateral mastoid sinus fractures are noted. There is gas throughout the deep soft tissues of the neck presumably related to the skullbase fractures. Air-fluid levels in the right maxillary and sphenoid sinuses.  Both vertebral arteries are normal. The basilar is normal. Cerebellar and posterior cerebral arteries are normal.  Cavernous carotid is normal bilaterally. No evidence of carotid dissection or injury. Petrous segment is  normal. Anterior and middle cerebral arteries are normal. Negative for aneurysm.  Review of the MIP images confirms the above findings.  CTA NECK FINDINGS  Proximal great vessels are normal. No mass or adenopathy in the neck.  Carotid artery is normal bilaterally. Negative for carotid injury. No atherosclerotic disease or dissection.  Both vertebral arteries are equal in size and widely patent. No significant atherosclerotic disease or dissection.  Review of the MIP images confirms the above findings.  IMPRESSION: Negative for arterial injury of the carotid or basilar artery bilaterally.  Skullbase fractures with pneumocephalus.   Electronically Signed   By: Marlan Palau M.D.   On: 12/06/2013 18:13   Ct Chest W Contrast   12/06/2013   CLINICAL DATA:  Trauma. Tenth between 4 clip in and around back. Severe right lower quadrant pain. Right arm limited range of motion.  EXAM: CT CHEST, ABDOMEN, AND PELVIS WITH CONTRAST  TECHNIQUE: Multidetector CT imaging of the chest, abdomen and pelvis was performed following the standard protocol during bolus administration of intravenous contrast.  CONTRAST:  90 cc Omnipaque 300 intravenous  COMPARISON:  None.  FINDINGS: CT CHEST FINDINGS  THORACIC INLET/BODY WALL:  No acute abnormality.  MEDIASTINUM:  Normal heart size. No pericardial effusion. No acute vascular abnormality. No adenopathy.  LUNG WINDOWS:  No contusion, hemothorax, or pneumothorax. Mild scattered atelectasis. Incidental air trapping in the apical left upper lobe; noted history of asthma.  OSSEOUS:  See below  CT ABDOMEN AND PELVIS FINDINGS  BODY WALL: Subcutaneous reticulation in the right lower lumbar region consistent with contusion.  Liver: No focal abnormality.  Biliary: No evidence of biliary obstruction or stone.  Pancreas: Unremarkable.  Spleen: Unremarkable.  Adrenals: Unremarkable.  Kidneys and ureters: No evidence of injury.  Bladder: Unremarkable.  Reproductive: Unremarkable.  Bowel: No evidence of  injury. Extensive distal colonic diverticulosis.  Retroperitoneum: No mass or adenopathy.  Peritoneum: No free fluid or gas.  Vascular: No acute findings.  OSSEOUS: No findings to explain right shoulder symptoms. Note that the upper scapula, humerus, and distal clavicle is not visualized.  L1 through L4 right transverse process fractures with up to moderate displacement/distraction. There is associated hemorrhage within the right quadratus lumborum and neighboring deep back muscles. No measurable hematoma or evidence of active hemorrhage.  IMPRESSION: 1. L1 through L4 right transverse process fractures. 2. No acute intrathoracic or intra-abdominal findings. 3. Extensive colonic diverticulosis.   Electronically Signed   By: Tiburcio Pea M.D.   On: 12/06/2013 16:04    Dg Pelvis Portable  12/06/2013   CLINICAL DATA:  The patient was pinned between the wall and a forklift well operating a forklift. Facial trauma.  EXAM: PORTABLE PELVIS 1-2 VIEWS  COMPARISON:  None.  FINDINGS: There is no evidence of pelvic fracture or dislocation. There are mild degenerative joint changes with narrowed bilateral hip joint spaces.  IMPRESSION: No acute fracture or dislocation.   Electronically Signed   By: Sherian Rein M.D.   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 Facial trauma Left eyelid, missing tooth top left and Right ear. LOC after intial accident, back at baseline. Back pain.  EXAM: PORTABLE CHEST - 1 VIEW  COMPARISON:  None.  FINDINGS: The heart size and mediastinal contours are within normal limits. Both lungs are clear. The visualized skeletal structures are unremarkable.  IMPRESSION: No active disease.   Electronically Signed   By: Elige Ko   On: 12/06/2013 15:00   Ct Maxillofacial Wo Cm  12/06/2013   CLINICAL DATA:  Patient was pinned between a forklift and rack at work, difficult hearing  in both ears, lacerations to LEFT face and LEFT eye  EXAM: CT HEAD WITHOUT CONTRAST  CT MAXILLOFACIAL WITHOUT CONTRAST  CT CERVICAL SPINE WITHOUT CONTRAST  TECHNIQUE: Multidetector CT imaging of the head, cervical spine, and maxillofacial structures were performed using the standard protocol without intravenous contrast. Multiplanar CT image reconstructions of the cervical spine and maxillofacial structures were also generated. RIGHT-side of face marked with a BB.  COMPARISON:  None.  FINDINGS: CT HEAD FINDINGS  Normal ventricular morphology.  No midline shift or mass effect.  Normal appearance of brain parenchyma.  No intracranial hemorrhage, mass lesion or evidence acute infarction.  Air present at the cavernous sinuses bilaterally, in the sella turcica, and suprasellar compatible with basilar skull fracture.  Partially opacified mastoid air cells bilaterally.  Air-fluid levels in RIGHT maxillary and sphenoid sinuses with partial ethmoid opacification bilaterally.  No calvarial fracture identified.  CT MAXILLOFACIAL FINDINGS  Air present within sella turcica, cavernous sinuses and suprasellar region.  Air-fluid level/hemorrhage within RIGHT sphenoid sinus and RIGHT maxillary sinus.  Partial opacification of ethmoid air cells bilaterally.  Small mucosal retention cyst LEFT maxillary sinus.  Frontal sinus and LEFT maxillary sinus otherwise clear.  Partial opacification of mastoid air cells bilaterally.  Alignment of the LEFT ossicles grossly normal.  Disruption of RIGHT ossicular chain with displacement of the malleus.  RIGHT temporal bone fracture extends to the RIGHT sphenoid across the lateral and anterior  walls of the RIGHT sphenoid sinus.  No definite fractures of the orbits, maxillary sinuses or zygomas.  Soft tissue gas identified anterior and inferior to the mastoid air cells bilaterally and tracking inferiorly into the parapharyngeal spaces.  Gas is seen near the courses of the internal carotid arteries  bilaterally at the carotid siphons but no discrete fracture planes involving the siphons are visualized.  Intraorbital soft tissue planes clear.  CT CERVICAL SPINE FINDINGS  Partial BILATERAL mastoid air cell opacification.  Prevertebral soft tissues normal thickness.  Minimal scattered disc space narrowing and endplate spur formation.  Vertebral body heights maintained.  No acute cervical spine fracture, subluxation, or bone destruction.  Lung apices clear.  IMPRESSION: No acute intracranial abnormalities.  Degenerative disc disease changes of the cervical spine.  No acute cervical spine abnormalities.  BILATERAL temporal bone fractures with partial opacification of the mastoid air cells bilaterally.  Disruption of the RIGHT ossicular chain.  Extension of RIGHT temporal fracture into the RIGHT sphenoid bone and traversing the RIGHT sphenoid sinus.  Though no definite involvement of the carotid canals is seen bilaterally, fracture planes particularly on the RIGHT are in close proximity to the carotid canals and if there is clinical concern for internal carotid injury or presence of neurologic impairment then consider CTA head to assess the internal carotid arteries at the skullbase.  Associated soft tissue gas caudal to the skullbase as well as intracranial gas at the cavernous sinuses, sella turcica and suprasellar regions.  Findings called to Dr. Modesto CharonWong on 12/06/2013 at 1650 hr.   Electronically Signed   By: Ulyses SouthwardMark  Boles M.D.   On: 12/06/2013 16:52   Ct Temporal Bones W/o Cm  12/07/2013   CLINICAL DATA:  Fork lift crush injury. 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  EXAM: CT HEAD AND TEMPORAL BONES WITHOUT CONTRAST  TECHNIQUE: Contiguous axial images were obtained from the base of the skull through the vertex without contrast. Multidetector CT imaging of the temporal bones was performed using the standard protocol without intravenous contrast.  COMPARISON:  CT head performed  yesterday. CT angio head neck also performed yesterday.  FINDINGS: CT HEAD FINDINGS  Continued suprasellar pneumocephalus status post basilar skull fracture. Air-fluid level with with hemorrhage RIGHT division sphenoid with pneumocephalus construct in the suprasellar cistern. No acute stroke or parenchymal hemorrhage. No midline shift or hydrocephalus. No extra-axial fluid. No calvarial fracture. Extensive air in the infratemporal extracranial soft tissues. Layering blood RIGHT maxillary sinus. BILATERAL ethmoid fluid/blood. Convexity pneumocephalus.  CT TEMPORAL BONES FINDINGS  There is a mixed longitudinal and transverse temporal bone fracture on the RIGHT with significant hemotympanum and blood layering in the mastoid air cells. Lateral wall mastoid fracture best seen on coronal imaging. Fracture extends into the posterior wall of the external auditory canal where there is significant blood lateral to the tympanic membrane. The middle ear cavity contains a moderate amount of blood inferiorly and anteriorly, with possible fracture of the scutum. There is disruption of the ossicular chain, with the head of the malleus displaced anteriorly and medially. There is disrupted tegmen tympani as seen on image 27 coronal, with possible fracture involving the horizontal and genicular segments of the RIGHT facial nerve canal.  No pneumolabyrinth, either the vestibule or cochlea. Air is seen in the oval window near the expected location of the stapes. No internal auditory canal fracture.  On the LEFT there is relatively minor changes of lateral mastoid wall fracture as seen on image  74 series 305. Ossicles are intact. There is extensive cerumen or blood in the LEFT external canal. Intact internal ear structures.  IMPRESSION: Mixed longitudinal and transverse temporal bone fracture on the RIGHT with ossicular disruption. Vertical disruption of the tegmen tympani likely extends anteriorly to involve the facial nerve in its  horizontal/geniculate segment. No apparent pneumolabyrinth.  Minor lateral wall mastoid fracture on the LEFT with layering fluid. External canal is filled with cerumen or blood, but middle ear and inner ear structures grossly intact.   Electronically Signed   By: Davonna BellingJohn  Curnes M.D.   On: 12/07/2013 11:34    Assessment/Plan: Diagnosis: TBI with polytrauma 1. Does the need for close, 24 hr/day medical supervision in concert with the patient's rehab needs make it unreasonable for this patient to be served in a less intensive setting? Yes 2. Co-Morbidities requiring supervision/potential complications: see above 3. Due to bladder management, bowel management, safety, skin/wound care, disease management, medication administration and pain management, does the patient require 24 hr/day rehab nursing? Yes 4. Does the patient require coordinated care of a physician, rehab nurse, PT (1-2 hrs/day, 5 days/week), OT (1-2 hrs/day, 5 days/week) and SLP (1-2 hrs/day, 5 days/week) to address physical and functional deficits in the context of the above medical diagnosis(es)? Yes Addressing deficits in the following areas: balance, endurance, locomotion, strength, transferring, bowel/bladder control, bathing, dressing, feeding, grooming, toileting, cognition, speech and psychosocial support 5. Can the patient actively participate in an intensive therapy program of at least 3 hrs of therapy per day at least 5 days per week? Yes 6. The potential for patient to make measurable gains while on inpatient rehab is excellent 7. Anticipated functional outcomes upon discharge from inpatient rehab are modified independent  with PT, modified independent with OT, modified independent with SLP. 8. Estimated rehab length of stay to reach the above functional goals is: 7-10 days 9. Does the patient have adequate social supports to accommodate these discharge functional goals? Yes 10. Anticipated D/C setting: Home 11. Anticipated post  D/C treatments: HH therapy and Outpatient therapy 12. Overall Rehab/Functional Prognosis: excellent  RECOMMENDATIONS: This patient's condition is appropriate for continued rehabilitative care in the following setting: CIR Patient has agreed to participate in recommended program. Yes Note that insurance prior authorization may be required for reimbursement for recommended care.  Comment: Rehab Admissions Coordinator to follow up.  Thanks,  Ranelle OysterZachary T. Angeleigh Chiasson, MD, [place] DomFAAPMR     12/08/2013

## 2013-12-08 NOTE — Progress Notes (Signed)
Pt being followed by [employer] for workers comp.  CM is Luanna Salkuth Watchorn.  Her phone is: [phone].  Her fax is [phone].  She is aware of pt's need for CIR after discharge. Aware that he may require transfer to outside facitily due to bed availability.   Carlyle LipaMichelle Raeley Gilmore, RN BSN MHA CCM  Case Manager, Trauma Service/Unit [facility] [phone]
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
## 2013-12-08 NOTE — Progress Notes (Signed)
Trauma Service Note  Subjective: Patient having difficulty hearing.  Patch over right eye.  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  Right facial droop.  Lungs: Clear  Abd: Benign  Extremities: Intact  Neuro: Right facial palsy.  Diplopia.    Lab Results: CBC   Recent Labs  12/06/13 1408 12/07/13 0206  WBC 15.7* 13.6*  HGB 14.0 12.9*  HCT 42.0 39.5  PLT 236 217   BMET  Recent Labs  12/06/13 1408 12/07/13 0206  NA 141 137  K 3.3* 4.8  CL 100 100  CO2 24 25  GLUCOSE 125* 137*  BUN 15 16  CREATININE 0.96 0.72  CALCIUM 8.7 8.5   PT/INR  Recent Labs  12/06/13 1408  LABPROT 14.8  INR 1.14   ABG No results for input(s): PHART, HCO3 in the last 72 hours.  Invalid input(s): PCO2, PO2  Studies/Results: Ct Angio Head W/cm &/or Wo Cm  12/06/2013   CLINICAL DATA:  Hit by forklift. Skull fractures. Rule out arterial injury.  EXAM: CT ANGIOGRAPHY HEAD AND NECK  TECHNIQUE: Multidetector CT imaging of the head and neck was performed using the standard protocol during bolus administration of intravenous contrast. Multiplanar CT image reconstructions and MIPs were obtained to evaluate the vascular anatomy. Carotid stenosis measurements (when applicable) are obtained utilizing NASCET criteria, using the distal internal carotid diameter as the denominator.  CONTRAST:  50mL OMNIPAQUE IOHEXOL 350 MG/ML SOLN  COMPARISON:  CT the head and cervical spine earlier today  FINDINGS: CTA HEAD FINDINGS  Ventricle size is normal. No intracranial hemorrhage.  There is pneumocephalus with gas in the right cavernous sinus and between the clivus and basilar and suprasellar cistern. This is compatible with basilar skull fractures. Bilateral mastoid sinus fractures are noted. There is gas throughout the deep soft tissues of the neck presumably related to the skullbase fractures. Air-fluid levels in the right maxillary and sphenoid sinuses.  Both vertebral arteries are normal. The basilar is normal. Cerebellar and posterior cerebral arteries are normal.  Cavernous carotid is normal bilaterally. No evidence of carotid dissection or injury. Petrous segment is normal. Anterior and middle cerebral arteries are normal. Negative for aneurysm.  Review of the MIP images confirms the above findings.  CTA NECK FINDINGS  Proximal great vessels are normal. No mass or adenopathy in the neck.  Carotid artery is normal bilaterally. Negative for carotid injury. No atherosclerotic disease or dissection.  Both vertebral arteries are equal in size and widely patent. No significant atherosclerotic disease or dissection.  Review of the MIP images confirms the above findings.  IMPRESSION: Negative for arterial injury of the carotid or basilar artery bilaterally.  Skullbase fractures with pneumocephalus.   Electronically Signed   By: Marlan Palauharles  Clark M.D.   On: 12/06/2013 18:13   Ct Head Wo Contrast  12/07/2013   CLINICAL DATA:  Fork lift crush injury. 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  EXAM: CT HEAD AND TEMPORAL BONES WITHOUT CONTRAST  TECHNIQUE: Contiguous axial images were obtained from the base  of the skull through the vertex without contrast. Multidetector CT imaging of the temporal bones was performed using the standard protocol without intravenous contrast.  COMPARISON:  CT head performed yesterday. CT angio head neck also performed yesterday.  FINDINGS: CT HEAD FINDINGS  Continued suprasellar pneumocephalus status post basilar skull fracture. Air-fluid  level with with hemorrhage RIGHT division sphenoid with pneumocephalus construct in the suprasellar cistern. No acute stroke or parenchymal hemorrhage. No midline shift or hydrocephalus. No extra-axial fluid. No calvarial fracture. Extensive air in the infratemporal extracranial soft tissues. Layering blood RIGHT maxillary sinus. BILATERAL ethmoid fluid/blood. Convexity pneumocephalus.  CT TEMPORAL BONES FINDINGS  There is a mixed longitudinal and transverse temporal bone fracture on the RIGHT with significant hemotympanum and blood layering in the mastoid air cells. Lateral wall mastoid fracture best seen on coronal imaging. Fracture extends into the posterior wall of the external auditory canal where there is significant blood lateral to the tympanic membrane. The middle ear cavity contains a moderate amount of blood inferiorly and anteriorly, with possible fracture of the scutum. There is disruption of the ossicular chain, with the head of the malleus displaced anteriorly and medially. There is disrupted tegmen tympani as seen on image 27 coronal, with possible fracture involving the horizontal and genicular segments of the RIGHT facial nerve canal.  No pneumolabyrinth, either the vestibule or cochlea. Air is seen in the oval window near the expected location of the stapes. No internal auditory canal fracture.  On the LEFT there is relatively minor changes of lateral mastoid wall fracture as seen on image 74 series 305. Ossicles are intact. There is extensive cerumen or blood in the LEFT external canal. Intact internal ear structures.  IMPRESSION: Mixed longitudinal and transverse temporal bone fracture on the RIGHT with ossicular disruption. Vertical disruption of the tegmen tympani likely extends anteriorly to involve the facial nerve in its horizontal/geniculate segment. No apparent pneumolabyrinth.  Minor lateral wall mastoid fracture on the LEFT with layering fluid. External canal is filled with cerumen or  blood, but middle ear and inner ear structures grossly intact.   Electronically Signed   By: Davonna Belling M.D.   On: 12/07/2013 11:34   Ct Head Wo Contrast  12/06/2013   CLINICAL DATA:  Patient was pinned between a forklift and rack at work, difficult hearing in both ears, lacerations to LEFT face and LEFT eye  EXAM: CT HEAD WITHOUT CONTRAST  CT MAXILLOFACIAL WITHOUT CONTRAST  CT CERVICAL SPINE WITHOUT CONTRAST  TECHNIQUE: Multidetector CT imaging of the head, cervical spine, and maxillofacial structures were performed using the standard protocol without intravenous contrast. Multiplanar CT image reconstructions of the cervical spine and maxillofacial structures were also generated. RIGHT-side of face marked with a BB.  COMPARISON:  None.  FINDINGS: CT HEAD FINDINGS  Normal ventricular morphology.  No midline shift or mass effect.  Normal appearance of brain parenchyma.  No intracranial hemorrhage, mass lesion or evidence acute infarction.  Air present at the cavernous sinuses bilaterally, in the sella turcica, and suprasellar compatible with basilar skull fracture.  Partially opacified mastoid air cells bilaterally.  Air-fluid levels in RIGHT maxillary and sphenoid sinuses with partial ethmoid opacification bilaterally.  No calvarial fracture identified.  CT MAXILLOFACIAL FINDINGS  Air present within sella turcica, cavernous sinuses and suprasellar region.  Air-fluid level/hemorrhage within RIGHT sphenoid sinus and RIGHT maxillary sinus.  Partial opacification of ethmoid air cells bilaterally.  Small mucosal retention cyst LEFT maxillary sinus.  Frontal sinus and LEFT maxillary sinus otherwise clear.  Partial opacification of mastoid air cells bilaterally.  Alignment of the LEFT ossicles grossly normal.  Disruption of RIGHT ossicular chain with displacement of the malleus.  RIGHT temporal bone fracture extends to the RIGHT sphenoid across the lateral and anterior walls of the RIGHT sphenoid sinus.  No definite  fractures of the orbits, maxillary sinuses or zygomas.  Soft tissue gas identified anterior and inferior to the mastoid air cells bilaterally and tracking inferiorly into the parapharyngeal spaces.  Gas is seen near the courses of the internal carotid arteries bilaterally at the carotid siphons but no discrete fracture planes involving the siphons are visualized.  Intraorbital soft tissue planes clear.  CT CERVICAL SPINE FINDINGS  Partial BILATERAL mastoid air cell opacification.  Prevertebral soft tissues normal thickness.  Minimal scattered disc space narrowing and endplate spur formation.  Vertebral body heights maintained.  No acute cervical spine fracture, subluxation, or bone destruction.  Lung apices clear.  IMPRESSION: No acute intracranial abnormalities.  Degenerative disc disease changes of the cervical spine.  No acute cervical spine abnormalities.  BILATERAL temporal bone fractures with partial opacification of the mastoid air cells bilaterally.  Disruption of the RIGHT ossicular chain.  Extension of RIGHT temporal fracture into the RIGHT sphenoid bone and traversing the RIGHT sphenoid sinus.  Though no definite involvement of the carotid canals is seen bilaterally, fracture planes particularly on the RIGHT are in close proximity to the carotid canals and if there is clinical concern for internal carotid injury or presence of neurologic impairment then consider CTA head to assess the internal carotid arteries at the skullbase.  Associated soft tissue gas caudal to the skullbase as well as intracranial gas at the cavernous sinuses, sella turcica and suprasellar regions.  Findings called to Dr. Modesto Charon on 12/06/2013 at 1650 hr.   Electronically Signed   By: Ulyses Southward M.D.   On: 12/06/2013 16:52   Ct Angio Neck W/cm &/or Wo/cm  12/06/2013   CLINICAL DATA:  Hit by forklift. Skull fractures. Rule out arterial injury.  EXAM: CT ANGIOGRAPHY HEAD AND NECK  TECHNIQUE: Multidetector CT imaging of the head and  neck was performed using the standard protocol during bolus administration of intravenous contrast. Multiplanar CT image reconstructions and MIPs were obtained to evaluate the vascular anatomy. Carotid stenosis measurements (when applicable) are obtained utilizing NASCET criteria, using the distal internal carotid diameter as the denominator.  CONTRAST:  50mL OMNIPAQUE IOHEXOL 350 MG/ML SOLN  COMPARISON:  CT the head and cervical spine earlier today  FINDINGS: CTA HEAD FINDINGS  Ventricle size is normal. No intracranial hemorrhage. There is pneumocephalus with gas in the right cavernous sinus and between the clivus and basilar and suprasellar cistern. This is compatible with basilar skull fractures. Bilateral mastoid sinus fractures are noted. There is gas throughout the deep soft tissues of the neck presumably related to the skullbase fractures. Air-fluid levels in the right maxillary and sphenoid sinuses.  Both vertebral arteries are normal. The basilar is normal. Cerebellar and posterior cerebral arteries are normal.  Cavernous carotid is normal bilaterally. No evidence of carotid dissection or injury. Petrous segment is normal. Anterior and middle cerebral arteries are normal. Negative for aneurysm.  Review of the MIP images confirms the above findings.  CTA NECK FINDINGS  Proximal great vessels are normal. No mass or adenopathy in the neck.  Carotid artery is normal bilaterally. Negative for carotid injury. No atherosclerotic disease or dissection.  Both vertebral arteries are equal in size and widely patent. No significant atherosclerotic  disease or dissection.  Review of the MIP images confirms the above findings.  IMPRESSION: Negative for arterial injury of the carotid or basilar artery bilaterally.  Skullbase fractures with pneumocephalus.   Electronically Signed   By: Marlan Palau M.D.   On: 12/06/2013 18:13   Ct Chest W Contrast  12/06/2013   CLINICAL DATA:  Trauma. Tenth between 4 clip in and around  back. Severe right lower quadrant pain. Right arm limited range of motion.  EXAM: CT CHEST, ABDOMEN, AND PELVIS WITH CONTRAST  TECHNIQUE: Multidetector CT imaging of the chest, abdomen and pelvis was performed following the standard protocol during bolus administration of intravenous contrast.  CONTRAST:  90 cc Omnipaque 300 intravenous  COMPARISON:  None.  FINDINGS: CT CHEST FINDINGS  THORACIC INLET/BODY WALL:  No acute abnormality.  MEDIASTINUM:  Normal heart size. No pericardial effusion. No acute vascular abnormality. No adenopathy.  LUNG WINDOWS:  No contusion, hemothorax, or pneumothorax. Mild scattered atelectasis. Incidental air trapping in the apical left upper lobe; noted history of asthma.  OSSEOUS:  See below  CT ABDOMEN AND PELVIS FINDINGS  BODY WALL: Subcutaneous reticulation in the right lower lumbar region consistent with contusion.  Liver: No focal abnormality.  Biliary: No evidence of biliary obstruction or stone.  Pancreas: Unremarkable.  Spleen: Unremarkable.  Adrenals: Unremarkable.  Kidneys and ureters: No evidence of injury.  Bladder: Unremarkable.  Reproductive: Unremarkable.  Bowel: No evidence of injury. Extensive distal colonic diverticulosis.  Retroperitoneum: No mass or adenopathy.  Peritoneum: No free fluid or gas.  Vascular: No acute findings.  OSSEOUS: No findings to explain right shoulder symptoms. Note that the upper scapula, humerus, and distal clavicle is not visualized.  L1 through L4 right transverse process fractures with up to moderate displacement/distraction. There is associated hemorrhage within the right quadratus lumborum and neighboring deep back muscles. No measurable hematoma or evidence of active hemorrhage.  IMPRESSION: 1. L1 through L4 right transverse process fractures. 2. No acute intrathoracic or intra-abdominal findings. 3. Extensive colonic diverticulosis.   Electronically Signed   By: Tiburcio Pea M.D.   On: 12/06/2013 16:04   Ct Cervical Spine Wo  Contrast  12/06/2013   CLINICAL DATA:  Patient was pinned between a forklift and rack at work, difficult hearing in both ears, lacerations to LEFT face and LEFT eye  EXAM: CT HEAD WITHOUT CONTRAST  CT MAXILLOFACIAL WITHOUT CONTRAST  CT CERVICAL SPINE WITHOUT CONTRAST  TECHNIQUE: Multidetector CT imaging of the head, cervical spine, and maxillofacial structures were performed using the standard protocol without intravenous contrast. Multiplanar CT image reconstructions of the cervical spine and maxillofacial structures were also generated. RIGHT-side of face marked with a BB.  COMPARISON:  None.  FINDINGS: CT HEAD FINDINGS  Normal ventricular morphology.  No midline shift or mass effect.  Normal appearance of brain parenchyma.  No intracranial hemorrhage, mass lesion or evidence acute infarction.  Air present at the cavernous sinuses bilaterally, in the sella turcica, and suprasellar compatible with basilar skull fracture.  Partially opacified mastoid air cells bilaterally.  Air-fluid levels in RIGHT maxillary and sphenoid sinuses with partial ethmoid opacification bilaterally.  No calvarial fracture identified.  CT MAXILLOFACIAL FINDINGS  Air present within sella turcica, cavernous sinuses and suprasellar region.  Air-fluid level/hemorrhage within RIGHT sphenoid sinus and RIGHT maxillary sinus.  Partial opacification of ethmoid air cells bilaterally.  Small mucosal retention cyst LEFT maxillary sinus.  Frontal sinus and LEFT maxillary sinus otherwise clear.  Partial opacification of mastoid air cells bilaterally.  Alignment  of the LEFT ossicles grossly normal.  Disruption of RIGHT ossicular chain with displacement of the malleus.  RIGHT temporal bone fracture extends to the RIGHT sphenoid across the lateral and anterior walls of the RIGHT sphenoid sinus.  No definite fractures of the orbits, maxillary sinuses or zygomas.  Soft tissue gas identified anterior and inferior to the mastoid air cells bilaterally and  tracking inferiorly into the parapharyngeal spaces.  Gas is seen near the courses of the internal carotid arteries bilaterally at the carotid siphons but no discrete fracture planes involving the siphons are visualized.  Intraorbital soft tissue planes clear.  CT CERVICAL SPINE FINDINGS  Partial BILATERAL mastoid air cell opacification.  Prevertebral soft tissues normal thickness.  Minimal scattered disc space narrowing and endplate spur formation.  Vertebral body heights maintained.  No acute cervical spine fracture, subluxation, or bone destruction.  Lung apices clear.  IMPRESSION: No acute intracranial abnormalities.  Degenerative disc disease changes of the cervical spine.  No acute cervical spine abnormalities.  BILATERAL temporal bone fractures with partial opacification of the mastoid air cells bilaterally.  Disruption of the RIGHT ossicular chain.  Extension of RIGHT temporal fracture into the RIGHT sphenoid bone and traversing the RIGHT sphenoid sinus.  Though no definite involvement of the carotid canals is seen bilaterally, fracture planes particularly on the RIGHT are in close proximity to the carotid canals and if there is clinical concern for internal carotid injury or presence of neurologic impairment then consider CTA head to assess the internal carotid arteries at the skullbase.  Associated soft tissue gas caudal to the skullbase as well as intracranial gas at the cavernous sinuses, sella turcica and suprasellar regions.  Findings called to Dr. Modesto Charon on 12/06/2013 at 1650 hr.   Electronically Signed   By: Ulyses Southward M.D.   On: 12/06/2013 16:52   Ct Abdomen Pelvis W Contrast  12/06/2013   CLINICAL DATA:  Trauma. Tenth between 4 clip in and around back. Severe right lower quadrant pain. Right arm limited range of motion.  EXAM: CT CHEST, ABDOMEN, AND PELVIS WITH CONTRAST  TECHNIQUE: Multidetector CT imaging of the chest, abdomen and pelvis was performed following the standard protocol during bolus  administration of intravenous contrast.  CONTRAST:  90 cc Omnipaque 300 intravenous  COMPARISON:  None.  FINDINGS: CT CHEST FINDINGS  THORACIC INLET/BODY WALL:  No acute abnormality.  MEDIASTINUM:  Normal heart size. No pericardial effusion. No acute vascular abnormality. No adenopathy.  LUNG WINDOWS:  No contusion, hemothorax, or pneumothorax. Mild scattered atelectasis. Incidental air trapping in the apical left upper lobe; noted history of asthma.  OSSEOUS:  See below  CT ABDOMEN AND PELVIS FINDINGS  BODY WALL: Subcutaneous reticulation in the right lower lumbar region consistent with contusion.  Liver: No focal abnormality.  Biliary: No evidence of biliary obstruction or stone.  Pancreas: Unremarkable.  Spleen: Unremarkable.  Adrenals: Unremarkable.  Kidneys and ureters: No evidence of injury.  Bladder: Unremarkable.  Reproductive: Unremarkable.  Bowel: No evidence of injury. Extensive distal colonic diverticulosis.  Retroperitoneum: No mass or adenopathy.  Peritoneum: No free fluid or gas.  Vascular: No acute findings.  OSSEOUS: No findings to explain right shoulder symptoms. Note that the upper scapula, humerus, and distal clavicle is not visualized.  L1 through L4 right transverse process fractures with up to moderate displacement/distraction. There is associated hemorrhage within the right quadratus lumborum and neighboring deep back muscles. No measurable hematoma or evidence of active hemorrhage.  IMPRESSION: 1. L1 through L4 right transverse  process fractures. 2. No acute intrathoracic or intra-abdominal findings. 3. Extensive colonic diverticulosis.   Electronically Signed   By: Tiburcio PeaJonathan  Watts M.D.   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 Facial trauma.  EXAM: PORTABLE PELVIS 1-2 VIEWS  COMPARISON:  None.  FINDINGS: There is no evidence of pelvic fracture or dislocation. There are mild  degenerative joint changes with narrowed bilateral hip joint spaces.  IMPRESSION: No acute fracture or dislocation.   Electronically Signed   By: Sherian ReinWei-Chen  Lin M.D.   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 Facial trauma Left eyelid, missing tooth top left and Right ear. LOC after intial accident, back at baseline. Back pain.  EXAM: PORTABLE CHEST - 1 VIEW  COMPARISON:  None.  FINDINGS: The heart size and mediastinal contours are within normal limits. Both lungs are clear. The visualized skeletal structures are unremarkable.  IMPRESSION: No active disease.   Electronically Signed   By: Elige KoHetal  Patel   On: 12/06/2013 15:00   Ct Maxillofacial Wo Cm  12/06/2013   CLINICAL DATA:  Patient was pinned between a forklift and rack at work, difficult hearing in both ears, lacerations to LEFT face and LEFT eye  EXAM: CT HEAD WITHOUT CONTRAST  CT MAXILLOFACIAL WITHOUT CONTRAST  CT CERVICAL SPINE WITHOUT CONTRAST  TECHNIQUE: Multidetector CT imaging of the head, cervical spine, and maxillofacial structures were performed using the standard protocol without intravenous contrast. Multiplanar CT image reconstructions of the cervical spine and maxillofacial structures were also generated. RIGHT-side of face marked with a BB.  COMPARISON:  None.  FINDINGS: CT HEAD FINDINGS  Normal ventricular morphology.  No midline shift or mass effect.  Normal appearance of brain parenchyma.  No intracranial hemorrhage, mass lesion or evidence acute infarction.  Air present at the cavernous sinuses bilaterally, in the sella turcica, and suprasellar compatible with basilar skull fracture.  Partially opacified mastoid air cells bilaterally.  Air-fluid levels in RIGHT maxillary and sphenoid sinuses with partial ethmoid opacification bilaterally.  No calvarial fracture identified.  CT MAXILLOFACIAL FINDINGS  Air present within  sella turcica, cavernous sinuses and suprasellar region.  Air-fluid level/hemorrhage within RIGHT sphenoid sinus and RIGHT maxillary sinus.  Partial opacification of ethmoid air cells bilaterally.  Small mucosal retention cyst LEFT maxillary sinus.  Frontal sinus and LEFT maxillary sinus otherwise clear.  Partial opacification of mastoid air cells bilaterally.  Alignment of the LEFT ossicles grossly normal.  Disruption of RIGHT ossicular chain with displacement of the malleus.  RIGHT temporal bone fracture extends to the RIGHT sphenoid across the lateral and anterior walls of the RIGHT sphenoid sinus.  No definite fractures of the orbits, maxillary sinuses or zygomas.  Soft tissue gas identified anterior and inferior to the mastoid air cells bilaterally and tracking inferiorly into the parapharyngeal spaces.  Gas is seen near the courses of the internal carotid arteries bilaterally at the carotid siphons but no discrete fracture planes involving the siphons are visualized.  Intraorbital soft tissue planes clear.  CT CERVICAL SPINE FINDINGS  Partial BILATERAL mastoid air cell opacification.  Prevertebral soft tissues normal thickness.  Minimal scattered disc space narrowing and endplate spur formation.  Vertebral body heights maintained.  No acute cervical spine fracture, subluxation, or bone destruction.  Lung apices clear.  IMPRESSION: No acute intracranial abnormalities.  Degenerative disc disease changes of the cervical spine.  No acute cervical spine abnormalities.  BILATERAL temporal bone fractures with partial opacification of the mastoid air cells bilaterally.  Disruption of the RIGHT ossicular chain.  Extension of RIGHT temporal fracture into the RIGHT sphenoid bone and traversing the RIGHT sphenoid sinus.  Though no definite involvement of the carotid canals is seen bilaterally, fracture planes particularly on the RIGHT are in close proximity to the carotid canals and if there is clinical concern for  internal carotid injury or presence of neurologic impairment then consider CTA head to assess the internal carotid arteries at the skullbase.  Associated soft tissue gas caudal to the skullbase as well as intracranial gas at the cavernous sinuses, sella turcica and suprasellar regions.  Findings called to Dr. Modesto Charon on 12/06/2013 at 1650 hr.   Electronically Signed   By: Ulyses Southward M.D.   On: 12/06/2013 16:52   Ct Temporal Bones W/o Cm  12/07/2013   CLINICAL DATA:  Fork lift crush injury. 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  EXAM: CT HEAD AND TEMPORAL BONES WITHOUT CONTRAST  TECHNIQUE: Contiguous axial images were obtained from the base of the skull through the vertex without contrast. Multidetector CT imaging of the temporal bones was performed using the standard protocol without intravenous contrast.  COMPARISON:  CT head performed yesterday. CT angio head neck also performed yesterday.  FINDINGS: CT HEAD FINDINGS  Continued suprasellar pneumocephalus status post basilar skull fracture. Air-fluid level with with hemorrhage RIGHT division sphenoid with pneumocephalus construct in the suprasellar cistern. No acute stroke or parenchymal hemorrhage. No midline shift or hydrocephalus. No extra-axial fluid. No calvarial fracture. Extensive air in the infratemporal extracranial soft tissues. Layering blood RIGHT maxillary sinus. BILATERAL ethmoid fluid/blood. Convexity pneumocephalus.  CT TEMPORAL BONES FINDINGS  There is a mixed longitudinal and transverse temporal bone fracture on the RIGHT with significant hemotympanum and blood layering in the mastoid air cells. Lateral wall mastoid fracture best seen on coronal imaging. Fracture extends into the posterior wall of the external auditory canal where there is significant blood lateral to the tympanic membrane. The middle ear cavity contains a moderate amount of blood inferiorly and anteriorly, with possible fracture of the scutum.  There is disruption of the ossicular chain, with the head of the malleus displaced anteriorly and medially. There is disrupted tegmen tympani as seen on image 27 coronal, with possible fracture involving the horizontal and genicular segments of the RIGHT facial nerve canal.  No pneumolabyrinth, either the vestibule or cochlea. Air is seen in the oval window near the expected location of the stapes. No internal auditory canal fracture.  On the LEFT there is relatively minor changes of lateral mastoid wall fracture as seen on image 74 series 305. Ossicles are intact. There is extensive cerumen or blood in the LEFT external canal. Intact internal ear structures.  IMPRESSION: Mixed longitudinal and transverse temporal bone fracture on the RIGHT with ossicular disruption. Vertical disruption of the tegmen tympani likely extends anteriorly to involve the facial nerve in its horizontal/geniculate segment. No apparent pneumolabyrinth.  Minor lateral wall mastoid fracture on the LEFT with layering fluid. External canal is filled with cerumen or blood, but middle ear and inner ear structures grossly intact.   Electronically Signed   By: Davonna Belling M.D.   On: 12/07/2013 11:34    Anti-infectives: Anti-infectives    None  Assessment/Plan: s/p  Advance diet Transfer to floor.    LOS: 2 days   Marta Lamas. Gae Bon, MD, FACS [phone] Trauma Surgeon 12/08/2013

## 2013-12-08 NOTE — Progress Notes (Signed)
Occupational Therapy Treatment Patient Details Name: Michael GreeningJames Hooper MRN: [id number] DOB: 11-05-1960 Today's Date: 12/08/2013    History of present illness Pt s/p forklift accident with resulting B temporal fxs, R sphenoid fx, R ear ossicle fx - associated CN VI and VII deficits on R. R ear and L eyelid laceration. L1-4 TVP fxs   OT comments  Pt with improved performance today with decreased c/o dizziness with movement. Educating pt on using gaze stabilization during mobility and limiting head movement. Using patch for diplopia. At this time, continue to recommend CIR for A/D plan. Audiology came to work with pt. Will return to further assess vision and possible use of occlusion taping.  Follow Up Recommendations  CIR;Supervision/Assistance - 24 hour    Equipment Recommendations  3 in 1 bedside comode;Tub/shower bench    Recommendations for Other Services Rehab consult    Precautions / Restrictions Precautions Precautions: Fall Precaution Comments: vertigo symptoms with increased head movement       Mobility Bed Mobility Overal bed mobility: Needs Assistance Bed Mobility: Rolling;Sidelying to Sit Rolling: Supervision Sidelying to sit: Min assist       General bed mobility comments: improved participation with mobility with decreased c/o dizziness and decreased c/o pain  Transfers Overall transfer level: Needs assistance Equipment used: Rolling walker (2 wheeled) Transfers: Sit to/from [employer] CorporationStand;Stand Pivot Transfers Sit to Stand: Min assist Stand pivot transfers: Min assist       General transfer comment: improved ability to complete mobility today with decreased c/o dizziness    Balance Overall balance assessment: Needs assistance   Sitting balance-Leahy Scale: Fair     Standing balance support: During functional activity;Bilateral upper extremity supported Standing balance-Leahy Scale: Poor                     ADL                                        Functional mobility during ADLs: Minimal assistance;+2 for safety/equipment General ADL Comments: mobilizing with R eye occluded. Educated on using gaze stabilization during mobility to decrease symptoms of vertigo      Vision        horizontal diplopia. CN VI palsy             Perception     Praxis      Cognition   Behavior During Therapy: WFL for tasks assessed/performed Overall Cognitive Status:  (appears WFL. will further assess)                       Extremity/Trunk Assessment               Exercises     Shoulder Instructions       General Comments  Pt states he feels better    Pertinent Vitals/ Pain       Pain Assessment: 0-10 Pain Score: 4  Pain Location: back Pain Descriptors / Indicators: Burning Pain Intervention(s): Limited activity within patient's tolerance;Monitored during session;Repositioned  Home Living                                          Prior Functioning/Environment              Frequency Min 3X/week  Progress Toward Goals  OT Goals(current goals can now be found in the care plan section)  Progress towards OT goals: Progressing toward goals  Acute Rehab OT Goals Patient Stated Goal: to not feel sick OT Goal Formulation: With patient Time For Goal Achievement: 12/21/13 Potential to Achieve Goals: Good ADL Goals Pt Will Perform Grooming: with set-up;sitting Pt Will Perform Lower Body Bathing: with set-up;with supervision;with caregiver independent in assisting;sit to/from stand;with adaptive equipment Pt Will Perform Lower Body Dressing: with supervision;with set-up;with caregiver independent in assisting;with adaptive equipment;sit to/from stand Pt Will Transfer to Toilet: with supervision;ambulating Pt Will Perform Toileting - Clothing Manipulation and hygiene: with modified independence;sit to/from stand Additional ADL Goal #1: complete vsual gaze stabilization  exercises to improve ability to complete ADL and mobility  Plan Discharge plan remains appropriate    Co-evaluation                 End of Session Equipment Utilized During Treatment: Gait belt;Rolling walker   Activity Tolerance Patient tolerated treatment well   Patient Left in chair;with call bell/phone within reach;with family/visitor present   Nurse Communication Mobility status;Precautions;Other (comment)        Time: 9147-8295: 0909-0938 OT Time Calculation (min): 29 min  Charges: OT General Charges $OT Visit: 1 Procedure OT Treatments $Therapeutic Activity: 8-22 mins  Jeslin Bazinet,HILLARY 12/08/2013, 10:37 AM   Luisa DagoHilary Anjalee Cope, OTR/L  [phone] 12/08/2013

## 2013-12-09 ENCOUNTER — Encounter (HOSPITAL_COMMUNITY): Payer: Self-pay | Admitting: Nurse Practitioner

## 2013-12-09 ENCOUNTER — Inpatient Hospital Stay (HOSPITAL_COMMUNITY)
Admission: RE | Admit: 2013-12-09 | Discharge: 2013-12-16 | DRG: 084 | Disposition: A | Discharge status: Home / Self Care | Payer: Worker's Compensation | Source: Intra-hospital | Attending: Physical Medicine & Rehabilitation | Admitting: Physical Medicine & Rehabilitation

## 2013-12-09 DIAGNOSIS — T380X5A Adverse effect of glucocorticoids and synthetic analogues, initial encounter: Secondary | ICD-10-CM | POA: Diagnosis present

## 2013-12-09 DIAGNOSIS — R739 Hyperglycemia, unspecified: Secondary | ICD-10-CM | POA: Diagnosis present

## 2013-12-09 DIAGNOSIS — H9190 Unspecified hearing loss, unspecified ear: Secondary | ICD-10-CM | POA: Diagnosis present

## 2013-12-09 DIAGNOSIS — G510 Bell's palsy: Secondary | ICD-10-CM | POA: Diagnosis present

## 2013-12-09 DIAGNOSIS — S060XAA Concussion with loss of consciousness status unknown, initial encounter: Secondary | ICD-10-CM | POA: Diagnosis present

## 2013-12-09 DIAGNOSIS — S069X9A Unspecified intracranial injury with loss of consciousness of unspecified duration, initial encounter: Secondary | ICD-10-CM | POA: Diagnosis present

## 2013-12-09 DIAGNOSIS — M549 Dorsalgia, unspecified: Secondary | ICD-10-CM | POA: Diagnosis present

## 2013-12-09 DIAGNOSIS — S0181XA Laceration without foreign body of other part of head, initial encounter: Secondary | ICD-10-CM | POA: Diagnosis present

## 2013-12-09 DIAGNOSIS — S0219XS Other fracture of base of skull, sequela: Secondary | ICD-10-CM | POA: Diagnosis not present

## 2013-12-09 DIAGNOSIS — S0210XS Unspecified fracture of base of skull, sequela: Secondary | ICD-10-CM

## 2013-12-09 DIAGNOSIS — S32009A Unspecified fracture of unspecified lumbar vertebra, initial encounter for closed fracture: Secondary | ICD-10-CM | POA: Diagnosis present

## 2013-12-09 DIAGNOSIS — S060X9A Concussion with loss of consciousness of unspecified duration, initial encounter: Secondary | ICD-10-CM | POA: Diagnosis present

## 2013-12-09 DIAGNOSIS — D72829 Elevated white blood cell count, unspecified: Secondary | ICD-10-CM | POA: Diagnosis present

## 2013-12-09 DIAGNOSIS — R51 Headache: Secondary | ICD-10-CM | POA: Diagnosis present

## 2013-12-09 DIAGNOSIS — H4921 Sixth [abducent] nerve palsy, right eye: Secondary | ICD-10-CM | POA: Diagnosis present

## 2013-12-09 DIAGNOSIS — S02109A Fracture of base of skull, unspecified side, initial encounter for closed fracture: Secondary | ICD-10-CM | POA: Diagnosis present

## 2013-12-09 DIAGNOSIS — H532 Diplopia: Secondary | ICD-10-CM | POA: Diagnosis present

## 2013-12-09 DIAGNOSIS — R030 Elevated blood-pressure reading, without diagnosis of hypertension: Secondary | ICD-10-CM | POA: Diagnosis not present

## 2013-12-09 DIAGNOSIS — H4920 Sixth [abducent] nerve palsy, unspecified eye: Secondary | ICD-10-CM | POA: Diagnosis present

## 2013-12-09 DIAGNOSIS — T1490XA Injury, unspecified, initial encounter: Secondary | ICD-10-CM

## 2013-12-09 DIAGNOSIS — G9389 Other specified disorders of brain: Secondary | ICD-10-CM

## 2013-12-09 DIAGNOSIS — S32008A Other fracture of unspecified lumbar vertebra, initial encounter for closed fracture: Secondary | ICD-10-CM

## 2013-12-09 DIAGNOSIS — S0219XB Other fracture of base of skull, initial encounter for open fracture: Secondary | ICD-10-CM | POA: Diagnosis present

## 2013-12-09 DIAGNOSIS — S069XAA Unspecified intracranial injury with loss of consciousness status unknown, initial encounter: Secondary | ICD-10-CM | POA: Diagnosis present

## 2013-12-09 DIAGNOSIS — S32008S Other fracture of unspecified lumbar vertebra, sequela: Secondary | ICD-10-CM | POA: Diagnosis not present

## 2013-12-09 DIAGNOSIS — J45909 Unspecified asthma, uncomplicated: Secondary | ICD-10-CM | POA: Diagnosis present

## 2013-12-09 DIAGNOSIS — G529 Cranial nerve disorder, unspecified: Secondary | ICD-10-CM | POA: Diagnosis present

## 2013-12-09 DIAGNOSIS — S0219XA Other fracture of base of skull, initial encounter for closed fracture: Principal | ICD-10-CM | POA: Diagnosis present

## 2013-12-09 DIAGNOSIS — S0210XA Unspecified fracture of base of skull, initial encounter for closed fracture: Secondary | ICD-10-CM

## 2013-12-09 DIAGNOSIS — H7420 Discontinuity and dislocation of ear ossicles, unspecified ear: Secondary | ICD-10-CM | POA: Diagnosis present

## 2013-12-09 LAB — GLUCOSE, CAPILLARY
Glucose-Capillary: 125 mg/dL — ABNORMAL HIGH (ref 70–99)
Glucose-Capillary: 132 mg/dL — ABNORMAL HIGH (ref 70–99)

## 2013-12-09 MED ORDER — ONDANSETRON HCL 4 MG PO TABS: 4.0000 mg | ORAL_TABLET | Freq: Four times a day (QID) | ORAL | Status: DC | PRN

## 2013-12-09 MED ORDER — ALUM & MAG HYDROXIDE-SIMETH 200-200-20 MG/5ML PO SUSP: 30.0000 mL | ORAL | Status: DC | PRN

## 2013-12-09 MED ORDER — PREDNISONE 10 MG PO TABS: 10.0000 mg | ORAL_TABLET | Freq: Every day | ORAL | Status: DC

## 2013-12-09 MED ORDER — TRAZODONE HCL 50 MG PO TABS: 25.0000 mg | ORAL_TABLET | Freq: Every evening | ORAL | Status: DC | PRN

## 2013-12-09 MED ORDER — INSULIN ASPART 100 UNIT/ML ~~LOC~~ SOLN: 0.0000 [IU] | Freq: Every day | SUBCUTANEOUS | Status: DC

## 2013-12-09 MED ORDER — PREDNISONE 20 MG PO TABS
30.0000 mg | ORAL_TABLET | Freq: Every day | ORAL | Status: DC
Start: 2013-12-26 — End: 2013-12-16

## 2013-12-09 MED ORDER — OXYCODONE HCL 5 MG PO TABS
5.0000 mg | ORAL_TABLET | ORAL | Status: DC | PRN
  Administered 2013-12-09 – 2013-12-16 (×11): 10 mg via ORAL
  Filled 2013-12-09 (×8): qty 2
  Filled 2013-12-09: qty 1
  Filled 2013-12-09 (×3): qty 2

## 2013-12-09 MED ORDER — PREDNISONE 20 MG PO TABS: 20.0000 mg | ORAL_TABLET | Freq: Every day | ORAL | Status: DC

## 2013-12-09 MED ORDER — POLYVINYL ALCOHOL 1.4 % OP SOLN
1.0000 [drp] | Freq: Three times a day (TID) | OPHTHALMIC | Status: DC
  Administered 2013-12-09 – 2013-12-16 (×26): 1 [drp] via OPHTHALMIC
  Filled 2013-12-09: qty 15

## 2013-12-09 MED ORDER — PREDNISONE 50 MG PO TABS
70.0000 mg | ORAL_TABLET | Freq: Every day | ORAL | Status: AC
  Administered 2013-12-14 – 2013-12-16 (×3): 70 mg via ORAL
  Filled 2013-12-09 (×3): qty 1

## 2013-12-09 MED ORDER — FLEET ENEMA 7-19 GM/118ML RE ENEM: 1.0000 | ENEMA | Freq: Once | RECTAL | Status: AC | PRN

## 2013-12-09 MED ORDER — DIPHENHYDRAMINE HCL 12.5 MG/5ML PO ELIX: 12.5000 mg | ORAL_SOLUTION | Freq: Four times a day (QID) | ORAL | Status: DC | PRN

## 2013-12-09 MED ORDER — GUAIFENESIN-DM 100-10 MG/5ML PO SYRP
5.0000 mL | ORAL_SOLUTION | Freq: Four times a day (QID) | ORAL | Status: DC | PRN
  Administered 2013-12-11: 10 mL via ORAL
  Filled 2013-12-09: qty 10

## 2013-12-09 MED ORDER — PREDNISONE 50 MG PO TABS: 50.0000 mg | ORAL_TABLET | Freq: Every day | ORAL | Status: DC

## 2013-12-09 MED ORDER — PREDNISONE 50 MG PO TABS
60.0000 mg | ORAL_TABLET | Freq: Every day | ORAL | Status: DC
Start: 2013-12-17 — End: 2013-12-16
  Filled 2013-12-09: qty 1

## 2013-12-09 MED ORDER — BISACODYL 10 MG RE SUPP: 10.0000 mg | Freq: Every day | RECTAL | Status: DC | PRN

## 2013-12-09 MED ORDER — POLYETHYLENE GLYCOL 3350 17 G PO PACK
17.0000 g | PACK | Freq: Every day | ORAL | Status: DC
  Administered 2013-12-11 – 2013-12-15 (×3): 17 g via ORAL
  Filled 2013-12-09 (×9): qty 1

## 2013-12-09 MED ORDER — MECLIZINE HCL 25 MG PO TABS
25.0000 mg | ORAL_TABLET | Freq: Three times a day (TID) | ORAL | Status: DC | PRN
  Filled 2013-12-09: qty 1

## 2013-12-09 MED ORDER — PANTOPRAZOLE SODIUM 40 MG PO TBEC
40.0000 mg | DELAYED_RELEASE_TABLET | Freq: Every day | ORAL | Status: DC
  Administered 2013-12-10 – 2013-12-16 (×7): 40 mg via ORAL
  Filled 2013-12-09 (×7): qty 1

## 2013-12-09 MED ORDER — METHOCARBAMOL 500 MG PO TABS: 500.0000 mg | ORAL_TABLET | Freq: Four times a day (QID) | ORAL | Status: DC | PRN

## 2013-12-09 MED ORDER — PREDNISONE 50 MG PO TABS
80.0000 mg | ORAL_TABLET | Freq: Every day | ORAL | Status: AC
  Administered 2013-12-10 – 2013-12-13 (×4): 80 mg via ORAL
  Filled 2013-12-09 (×5): qty 1

## 2013-12-09 MED ORDER — PREDNISONE 20 MG PO TABS
40.0000 mg | ORAL_TABLET | Freq: Every day | ORAL | Status: DC
Start: 2013-12-23 — End: 2013-12-16

## 2013-12-09 MED ORDER — INSULIN ASPART 100 UNIT/ML ~~LOC~~ SOLN: 0.0000 [IU] | Freq: Three times a day (TID) | SUBCUTANEOUS | Status: DC

## 2013-12-09 MED ORDER — ACETAMINOPHEN 325 MG PO TABS
325.0000 mg | ORAL_TABLET | ORAL | Status: DC | PRN
  Administered 2013-12-10 – 2013-12-11 (×2): 650 mg via ORAL
  Filled 2013-12-09: qty 2
  Filled 2013-12-09 (×2): qty 1

## 2013-12-09 MED ORDER — ARTIFICIAL TEARS OP OINT
1.0000 "application " | TOPICAL_OINTMENT | Freq: Every day | OPHTHALMIC | Status: DC
  Administered 2013-12-10 – 2013-12-15 (×7): 1 via OPHTHALMIC
  Filled 2013-12-09: qty 3.5

## 2013-12-09 MED ORDER — ONDANSETRON HCL 4 MG/2ML IJ SOLN: 4.0000 mg | Freq: Four times a day (QID) | INTRAMUSCULAR | Status: DC | PRN

## 2013-12-09 NOTE — Progress Notes (Signed)
Pt is being discharged to inpatient  rehab. [facility]

## 2013-12-09 NOTE — Progress Notes (Signed)
Patient ID: Michael Hooper, male   DOB: 1960-03-24, 53 y.o.   MRN: [id number] Patient admitted to [id number] via bed, escorted by nursing staff and spouse.  Patient and spouse verbalized understanding of rehab process, signed fall safety agreement.  Patient appears to be in no immediate distress at this time.  Will continue to monitor.  Dani Gobbleeardon, Anvita Hirata J, RN

## 2013-12-09 NOTE — H&P (Signed)
Physical Medicine and Rehabilitation Admission H&P   Chief Complaint  Patient presents with  . TBI, difficulty hearing due to ossicular discontinuity, CN VII nerve palsy and CN VI injury, diplopia   HPI: Michael Hooper is a 53 y.o. 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 He had brief LOC and was [facility]. Work up revealed bilateral temporal bone fractures with extension of right temporal fracture into right sphenoid bone transversing the right sphenoid sinus, disruption of right ossicular chain, basilar skull fracture, L1-L4 transverse process fractures with moderate displacement and associated hemorrhage right quadratus lumborum and neighboring muscles. CTA head/neck negative for carotid or basilar artery injury. Dr. Jule SerNudleman consulted for input and recommended ENT evaluation for Right VI nerve palsy, Right VII peripheral nerve palsy and loss of hearing in the right ear. Conservative management with monitoring for complications/CSF leak recommended. Dr. Kelly SplinterSanger consulted and repaired right ear full thickness laceration and left upper eye lid stellate laceration past admission. Patient with reports of blurred and double vision right eye with vertigo and decreased hearing and complete right peripheral facial paralysis per Dr. Lazarus SalinesWolicki. He [facility] who recommended monitoring for spontaneous recovery with consideration for decompression of facial nerve and Ossiculoplasty later when stabilized. Patient started on high dose steroids for treatment as well as taping OD at nights--to follow up with ENT in a week and Opth consult recommended for diplopia. Therapy evaluations done and patient limited by back pain as well as dizziness with diplopia. MD and rehab team recommending CIR for progressive therapy and patient admitted today.  Episode of numbness  in the legs when in bed but none during therapy  Review of Systems  Eyes: Positive for blurred vision and double vision.   Right eye discomfort as well as light sensitivity.  Respiratory: Negative for cough and shortness of breath.  Cardiovascular: Negative for chest pain and palpitations.  Gastrointestinal: Positive for nausea and constipation. Negative for heartburn, vomiting and abdominal pain.  Genitourinary: Negative for dysuria and urgency.  Musculoskeletal: Positive for myalgias and back pain.  Neurological: Positive for dizziness, tingling, speech change and headaches.  Endo/Heme/Allergies: Positive for environmental allergies.  Psychiatric/Behavioral: The patient is nervous/anxious and has insomnia (last pm due to HA/back pain).   Past Medical History  Diagnosis Date  . Asthma    History reviewed. No pertinent past surgical history. Family History  Problem Relation Age of Onset  . Heart failure Mother   . Heart failure Father   . Asthma Brother   . Parkinson's disease Father   . Emphysema Mother     Social History: Married and independent PTA. Wife with multiple health problems. He reports that he has never smoked. He does not have any smokeless tobacco history on file. He reports that he drinks about 2.4 oz of alcohol per week. He reports that he does not use illicit drugs.    Allergies: No Known Allergies   No prescriptions prior to admission    Home: Home Living Family/patient expects to be discharged to:: Private residence Living Arrangements: Spouse/significant other Available Help at Discharge: Family Type of Home: House Home Access: Stairs to enter Secretary/administratorntrance Stairs-Number of Steps: 2 Home Layout: Two level, Bed/bath upstairs Alternate Level Stairs-Number of Steps: flight Alternate Level Stairs-Rails: Right Home Equipment: None  Functional History: Prior Function Level of Independence: Independent Comments:  working  Functional Status:  Mobility: Bed Mobility Overal bed mobility: Needs Assistance Bed Mobility: Rolling, Sidelying to Sit Rolling: Supervision Sidelying to sit: Min assist General bed mobility comments: improved participation with mobility with decreased c/o dizziness and decreased c/o pain Transfers Overall transfer level: Needs assistance Equipment used: Rolling walker (2 wheeled) Transfers: Sit to/from Stand Sit to Stand: Min assist Stand pivot transfers: Min assist General transfer comment: improved ability to complete mobility today with decreased c/o dizziness. pt worked on stabilizing gaze during transfer.  Ambulation/Gait Ambulation/Gait assistance: Min assist Ambulation Distance (Feet): 70 Feet Assistive device: Rolling walker (2 wheeled) Gait Pattern/deviations: Step-through pattern, Decreased stride length General Gait Details: During first half of gait pt utilized "A" taped to IV pole to stabilize gaze. 2nd person managed IV pole and lines/leads. During second half of gait pt did not utilize gaze stabilization, but maintained a neutral head position with pt indicating slight increased dizziness than when using "A", but improved from yesterday.     ADL: ADL Overall ADL's : Needs assistance/impaired Grooming: Minimal assistance Grooming Details (indicate cue type and reason): difficulty due to pain,vertigo and facial weakness Upper Body Bathing: Minimal assitance, Bed level Lower Body Bathing: Maximal assistance, Sit to/from stand Upper Body Dressing : Moderate assistance, Bed level Lower Body Dressing: Maximal assistance, Sit to/from stand Functional mobility during ADLs: Minimal assistance, +2 for safety/equipment General ADL Comments: mobilizing with R eye occluded. Educated on using gaze stabilization during mobility to decrease symptoms of vertigo  Cognition: Cognition Overall Cognitive Status: Difficult to assess Orientation Level: Oriented  X4 Cognition Arousal/Alertness: Awake/alert Behavior During Therapy: WFL for tasks assessed/performed Overall Cognitive Status: Difficult to assess Memory: Decreased short-term memory Difficult to assess due to: Hard of hearing/deaf  Physical Exam: Blood pressure 137/80, pulse 71, temperature 97.5 F (36.4 C), temperature source Oral, resp. rate 20, height 5\' 10"  (1.778 m), weight 114.7 kg (252 lb 13.9 oz), SpO2 95 %. Physical Exam  Nursing note and vitals reviewed. Constitutional: He is oriented to person, place, and time. He appears well-developed and well-nourished.  HENT:  Head: Normocephalic.  Right ear with surgical dressing. Sutures intact Eyes: Pupils are equal, round, and reactive to light. Right conjunctiva is injected.  Laceration on left lid with sutures in place.  Neck: Normal range of motion. Neck supple.  Respiratory: Effort normal and breath sounds normal.  GI: Soft. Bowel sounds are normal. He exhibits no distension. There is no tenderness.  Musculoskeletal: He exhibits no edema or tenderness.  Low back pain with SLR on right.  Neurological: He is alert and oriented to person, place, and time.  Speech clear. HOH. Unable to move right eye laterally with complaints of diplopia in right lateral field. Right facial paresis. Follows commands without difficulty.  Skin: Skin is warm and dry.  Large ecchymotic area on mid lower back.  Right lower facial weakness, unable to smile on the right. Motor strength 4/5 bilateral hip flexor and extensor ankle dorsiflexor plantar flexor partly limited by low back pain. Sensory intact to light touch in both lower limbs  Lab Results Last 48 Hours    No results found for this or any previous visit (from the past 48 hour(s)).    Imaging Results (Last 48 hours)    No results found.       Medical Problem List and Plan: 1. 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  DVT Prophylaxis/Anticoagulation: Mechanical: Sequential compression devices, below knee Bilateral lower extremities 3.  Pain Management: Continue oxycodone prn.  4. Mood: LCSW to follow for evaluation and support.  5. Neuropsych: This patient is capable of making decisions on his own behalf. 6. Skin/Wound Care: Routine pressure relief measures. Monitor wound for healing.  7. Fluids/Electrolytes/Nutrition: Monitor I/O. Continues on clear liquid diet--advance to full liquids and set a bowel program. Offer supplements if po intake poor.  8. Right ossicular disruption: To continue high dose steriods for a week followed by slow taper over 3 weeks per Dr. Lazarus Salines,  9. Leucocytosis: Monitor for signs of infection or fevers. Recheck CBC in am.  10. Steroid induced Hyperglycemia: Will check CBGs ac/hs to monitor for trend and use SSI for elevated BS.  11. Basilar skull fracture: Will order follow up CCT on Monday 11/16 per NS recommendations.  12. Right CN VI palsy: Eye drops thorough out the day with patching at bedtime. Patient educated on need for patching/moisture to prevent scleraeal abrasion.  13. Headaches: Will add topamax to help with symptoms.  14. Back pain: Unable to sleep last night due to back pain. He reports severe burning pain in back last night followed by BLE numbness that lasted for a period of time but resolved this am. Will order CT of lumbar spine for follow up as no formal spine X rays done at admission.    Post Admission Physician Evaluation: 1. Functional deficits secondary toTemporal skull fracture with traumatic brain injury and polytrauma . 2. Patient is admitted to receive collaborative, interdisciplinary care between the physiatrist, rehab nursing staff, and therapy team. 3. Patient's level of medical complexity and substantial therapy needs in context of that medical necessity cannot be provided at a lesser intensity of care such as a SNF. 4. Patient has  experienced substantial functional loss from his/her baseline which was documented above under the "Functional History" and "Functional Status" headings. Judging by the patient's diagnosis, physical exam, and functional history, the patient has potential for functional progress which will result in measurable gains while on inpatient rehab. These gains will be of substantial and practical use upon discharge in facilitating mobility and self-care at the household level. 5. Physiatrist will provide 24 hour management of medical needs as well as oversight of the therapy plan/treatment and provide guidance as appropriate regarding the interaction of the two. 6. 24 hour rehab nursing will assist with bladder management, bowel management, safety, skin/wound care, disease management, medication administration, pain management and patient education and help integrate therapy concepts, techniques,education, etc. 7. PT will assess and treat for/with: pre gait, gait training, endurance , safety, equipment, neuromuscular re education. Goals are: supervision. 8. OT will assess and treat for/with: ADLs, Cognitive perceptual skills, Neuromuscular re education, safety, endurance, equipment. Goals are: Supervision. Therapy may proceed with showering this patient with shower cap covering ears 9. SLP will assess and treat for/with: Memory, attention, concentration, medication management. Goals are: Modified independent medication management. 10. Case Management and Social Worker will assess and treat for psychological issues and discharge planning. 11. Team conference will be held weekly to assess progress toward goals and to determine barriers to discharge. 12. Patient will receive at least 3 hours of therapy per day at least 5 days per week. 13. ELOS: 7-10days  14. Prognosis: excellent     Erick Colace M.D. [facility] FAAPM&R (Sports Med, Neuromuscular Med) Diplomate Am Board  of Electrodiagnostic Med  12/09/2013

## 2013-12-09 NOTE — Progress Notes (Signed)
Rehab admissions - I have approval from workers comp for acute inpatient rehab admission for today.  Bed available, patient and wife agreeable, and will admit to inpatient rehab today.  Call me for questions.  #[phone]

## 2013-12-09 NOTE — Clinical Social Work Psychosocial (Signed)
Clinical Social Work Department BRIEF PSYCHOSOCIAL ASSESSMENT 12/09/2013  Patient:  Michael Hooper, Michael Hooper     Account Number:  [id number]     Admit date:  12/06/2013  Clinical Social Worker:  Marciano Sequin  Date/Time:  12/09/2013 10:04 AM  Referred by:  RN  Date Referred:  12/09/2013 Referred for  Other - See comment   Other Referral:   Trauma   Interview type:  Patient Other interview type:    PSYCHOSOCIAL DATA Living Status:  WIFE Admitted from facility:   Level of care:   Primary support name:  Michael Hooper,Michael Hooper Primary support relationship to patient:  SPOUSE Degree of support available:   Strong Support System    CURRENT CONCERNS Current Concerns  Post-Acute Placement   Other Concerns:    SOCIAL WORK ASSESSMENT / PLAN CSW met the pt at bedside. CSW introduced self and purpose of the visit. Pt reported he currently lives at home with his wife. Pt provided CSW with details regarding his accident at work. Pt identified feelings of stress and concerns regarding the accident, especially if and when can he can return back to work and when will he have surgery to repair the damage to his ear.  Pt wants to go back home at discharge, but understands that he may transition to rehab first. Pt reported having a strong support system. CSW inquired about current substance/ETOH use. Pt identified having a few beers (3) on Saturday. Pt reported that he was not under the influence during the accident. CSW and pt discussed some steps he can take to reduce his stress during this admission. No additional resources needed at this time.   Assessment/plan status:  No Further Intervention Required Other assessment/ plan:   SBIRT complete   Information/referral to community resources:    PATIENT'S/FAMILY'S RESPONSE TO PLAN OF CARE: Pt represented with a worried mood. Pt appeared to have several concerns regarding his current health condition and his ability to return back to work. Pt understands  that he may need rehab before returning home. Pt placed emphaize on wanting to know how long he will be out of work.    [place], MSW, [place]

## 2013-12-09 NOTE — Discharge Summary (Signed)
Physician Discharge Summary  Patient ID: Michael GreeningJames Tango MRN: [id number] DOB/AGE: 54-Aug-1962 54 y.o.  Admit date: 12/06/2013 Discharge date: 12/09/2013  Discharge Diagnoses Patient Active Problem List   Diagnosis Date Noted  . Blunt trauma 12/09/2013  . 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 Facial laceration 12/09/2013  . Lumbar transverse process fracture 12/09/2013  . Pneumocephalus, traumatic 12/09/2013  . Traumatic ossicular dislocation 12/09/2013  . Concussion 12/09/2013  . Skull base fx 12/06/2013    Consultants Dr. Flo ShanksKarol Wolicki for ENT  Dr. Wayland Denislaire Sanger for plastic surgery  Dr. Shirlean Kellyobert Nudelman for neurosurgery  Dr. Faith RogueZachary Swartz for PM&R   Procedures 11/9 -- Repair of complex right ear laceration (8 cm) closed in layers and included cartilage and left upper eye lid laceration complex 3 cm by Dr. 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 After releasing his load, the forklift kicked back and pinned him against a large rack. He had loss of consciousness at the scene. He regained consciousness and was transported to the hospital where he underwent evaluation by the emergency department physician. He was found to have skull base fractures with pneumocephalus, right facial paralysis, and lumbar transverse process fractures. He was admitted to the trauma service and ENT, plastics, and neurosurgery were consulted.   Hospital Course: The patient was taken to surgery for the above-mentioned procedure. Following that he was mobilized with physical and occupational therapies who recommended inpatient rehabilitation and they were consulted. A follow-up CT scan of the head was stable from the standpoint of his pneumocephalus. Because of the cranial nerve injuries he was started on high-dose prednisone. Once his pain was controlled and he was stable medically he was transferred to inpatient rehabilitation in good  condition.   Medications Scheduled Meds: . artificial tears  1 application Right Eye QHS  . pantoprazole  40 mg Oral Daily  . predniSONE  80 mg Oral Q breakfast   Continuous Infusions: . 0.9 % NaCl with KCl 20 mEq / L 50 mL/hr at 12/09/13 0426   PRN Meds:.meclizine, morphine injection, ondansetron **OR** ondansetron (ZOFRAN) IV, oxyCODONE, polyvinyl alcohol, tiZANidine       Follow-up Information    Follow up with Flo ShanksWOLICKI, KAROL, MD.   Specialty:  Otolaryngology   Contact information:   [address] [place] [address] [phone]       [facility].   Specialty:  Plastic Surgery   Contact information:   [address] [place] [address] [phone]       Follow up with Hewitt ShortsNUDELMAN,ROBERT W, MD.   Specialty:  Neurosurgery   Contact information:   [address] [place] [address] [phone]       Follow up with [facility].   Why:  As needed   Contact information:   [address] [place] [address] [phone]       Signed: Freeman CaldronMichael J. Aunika Kirsten, PA-C Pager: [phone] General Trauma PA Pager: [phone] 12/09/2013, 3:35 PM

## 2013-12-09 NOTE — Progress Notes (Signed)
Patient ID: Michael Hooper, male   DOB: [birth year], 53 y.o.   MRN: [id number]   LOS: 3 days   Subjective: Had rough night but no new c/o. Pain medicine adequate.   Objective: Vital signs in last 24 hours: Temp:  [97.5 F (36.4 C)-98.4 F (36.9 C)] 97.5 F (36.4 C) (11/12 1021) Pulse Rate:  [71-99] 71 (11/12 1021) Resp:  [18-20] 20 (11/12 1021) BP: (137-162)/(68-83) 137/80 mmHg (11/12 1021) SpO2:  [94 %-98 %] 95 % (11/12 1021) Last BM Date: 12/06/13   Physical Exam General appearance: alert and no distress Resp: clear to auscultation bilaterally Cardio: regular rate and rhythm GI: normal findings: bowel sounds normal and soft, non-tender  HENT: Right ear dressing, eye patch in place   Assessment/Plan: Industrial accident B temporal bone FXs/R sphenoid FX/R ear ossicle FX - associated CN VI and VII deficits on R. Appreciate Dr. Raye Hooper's evaluation Pneumocephalus associated with above - Dr. Newell Hooper  R ear and L eyelid lacs - S/P closure by Dr. Kelly Hooper L1-4 TVP FXs  FEN - No issues VTE - SCD's Dispo -- To CIR today    Michael CaldronMichael J. Domonick Sittner, PA-C Pager: [phone] General Trauma PA Pager: [phone]  12/09/2013

## 2013-12-09 NOTE — Progress Notes (Signed)
12/09/2013 9:49 AM  Annell GreeningBritten, Jovontae [id number]  Hosp  Day 3    Temp:  [97.5 F (36.4 C)-98.4 F (36.9 C)] 98.4 F (36.9 C) (11/12 0543) Pulse Rate:  [76-99] 84 (11/12 0543) Resp:  [18-20] 20 (11/12 0543) BP: (142-162)/(68-83) 145/70 mmHg (11/12 0543) SpO2:  [94 %-98 %] 96 % (11/12 0543),     Intake/Output Summary (Last 24 hours) at 12/09/13 0949 Last data filed at 12/09/13 0450  Gross per 24 hour  Intake    400 ml  Output   1100 ml  Net   -700 ml    No results found for this or any previous visit (from the past 24 hour(s)).  SUBJECTIVE:  Taping eye at night.  Using patch in daytime for diplopia.  Audiometry incomplete owing to RIGHT ear dressing/lacerations  OBJECTIVE:  No facial motion.  Ear healing well.    IMPRESSION:  Continue program of eye protection.  I spoke [facility] Delfino LovettEric Oliver[facility] who does not feel any urgent intervention for facial n palsy or hearing damage is indicated.  PLAN:  I would like to see him in my office soon to clean his ear canals and do audiometry again if possible.  Needs high dose steroids x 7 days, then 3 week taper.  Continue eye hygiene.  No nose blowing x 2 weeks.  Flo ShanksWOLICKI, Kailly Richoux

## 2013-12-09 NOTE — Progress Notes (Signed)
Subjective: Patient resting in bed comfortably, without complaints. Continuing to undergo therapies, being considered for CIR.  Objective: Vital signs in last 24 hours: Filed Vitals:   12/08/13 1704 12/08/13 2108 12/09/13 0156 12/09/13 0543  BP: 150/83 142/81 149/68 145/70  Pulse: 87 90 76 84  Temp: 97.5 F (36.4 C) 98.2 F (36.8 C) 97.7 F (36.5 C) 98.4 F (36.9 C)  TempSrc: Oral Oral Oral Oral  Resp: 20 18 18 20   Height:      Weight:      SpO2: 94% 97% 98% 96%    Intake/Output from previous day: 11/11 0701 - 11/12 0700 In: 600 [I.V.:600] Out: 1100 [Urine:1100] Intake/Output this shift:    Physical Exam:  Awake alert, oriented. Following commands. Mild dysarthria due to right peripheral facial palsy. Right sixth nerve palsy without change. Loss of hearing on the right as well.  Moving all 4 extremities well, with no drift of the upper extremities.  CBC  Recent Labs  12/06/13 1408 12/07/13 0206  WBC 15.7* 13.6*  HGB 14.0 12.9*  HCT 42.0 39.5  PLT 236 217   BMET  Recent Labs  12/06/13 1408 12/07/13 0206  NA 141 137  K 3.3* 4.8  CL 100 100  CO2 24 25  GLUCOSE 125* 137*  BUN 15 16  CREATININE 0.96 0.72  CALCIUM 8.7 8.5    Studies/Results: Ct Head Wo Contrast  12/07/2013   CLINICAL DATA:  Fork lift crush injury. 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  EXAM: CT HEAD AND TEMPORAL BONES WITHOUT CONTRAST  TECHNIQUE: Contiguous axial images were obtained from the base of the skull through the vertex without contrast. Multidetector CT imaging of the temporal bones was performed using the standard protocol without intravenous contrast.  COMPARISON:  CT head performed yesterday. CT angio head neck also performed yesterday.  FINDINGS: CT HEAD FINDINGS  Continued suprasellar pneumocephalus status post basilar skull fracture. Air-fluid level with with hemorrhage RIGHT division sphenoid with pneumocephalus construct in the suprasellar cistern.  No acute stroke or parenchymal hemorrhage. No midline shift or hydrocephalus. No extra-axial fluid. No calvarial fracture. Extensive air in the infratemporal extracranial soft tissues. Layering blood RIGHT maxillary sinus. BILATERAL ethmoid fluid/blood. Convexity pneumocephalus.  CT TEMPORAL BONES FINDINGS  There is a mixed longitudinal and transverse temporal bone fracture on the RIGHT with significant hemotympanum and blood layering in the mastoid air cells. Lateral wall mastoid fracture best seen on coronal imaging. Fracture extends into the posterior wall of the external auditory canal where there is significant blood lateral to the tympanic membrane. The middle ear cavity contains a moderate amount of blood inferiorly and anteriorly, with possible fracture of the scutum. There is disruption of the ossicular chain, with the head of the malleus displaced anteriorly and medially. There is disrupted tegmen tympani as seen on image 27 coronal, with possible fracture involving the horizontal and genicular segments of the RIGHT facial nerve canal.  No pneumolabyrinth, either the vestibule or cochlea. Air is seen in the oval window near the expected location of the stapes. No internal auditory canal fracture.  On the LEFT there is relatively minor changes of lateral mastoid wall fracture as seen on image 74 series 305. Ossicles are intact. There is extensive cerumen or blood in the LEFT external canal. Intact internal ear structures.  IMPRESSION: Mixed longitudinal and transverse temporal bone fracture on the RIGHT with ossicular disruption. Vertical disruption of the tegmen tympani likely extends anteriorly to involve the facial  nerve in its horizontal/geniculate segment. No apparent pneumolabyrinth.  Minor lateral wall mastoid fracture on the LEFT with layering fluid. External canal is filled with cerumen or blood, but middle ear and inner ear structures grossly intact.   Electronically Signed   By: Davonna BellingJohn  Curnes M.D.    On: 12/07/2013 11:34   Ct Temporal Bones W/o Cm  12/07/2013   CLINICAL DATA:  Fork lift crush injury. 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  EXAM: CT HEAD AND TEMPORAL BONES WITHOUT CONTRAST  TECHNIQUE: Contiguous axial images were obtained from the base of the skull through the vertex without contrast. Multidetector CT imaging of the temporal bones was performed using the standard protocol without intravenous contrast.  COMPARISON:  CT head performed yesterday. CT angio head neck also performed yesterday.  FINDINGS: CT HEAD FINDINGS  Continued suprasellar pneumocephalus status post basilar skull fracture. Air-fluid level with with hemorrhage RIGHT division sphenoid with pneumocephalus construct in the suprasellar cistern. No acute stroke or parenchymal hemorrhage. No midline shift or hydrocephalus. No extra-axial fluid. No calvarial fracture. Extensive air in the infratemporal extracranial soft tissues. Layering blood RIGHT maxillary sinus. BILATERAL ethmoid fluid/blood. Convexity pneumocephalus.  CT TEMPORAL BONES FINDINGS  There is a mixed longitudinal and transverse temporal bone fracture on the RIGHT with significant hemotympanum and blood layering in the mastoid air cells. Lateral wall mastoid fracture best seen on coronal imaging. Fracture extends into the posterior wall of the external auditory canal where there is significant blood lateral to the tympanic membrane. The middle ear cavity contains a moderate amount of blood inferiorly and anteriorly, with possible fracture of the scutum. There is disruption of the ossicular chain, with the head of the malleus displaced anteriorly and medially. There is disrupted tegmen tympani as seen on image 27 coronal, with possible fracture involving the horizontal and genicular segments of the RIGHT facial nerve canal.  No pneumolabyrinth, either the vestibule or cochlea. Air is seen in the oval window near the expected location of  the stapes. No internal auditory canal fracture.  On the LEFT there is relatively minor changes of lateral mastoid wall fracture as seen on image 74 series 305. Ossicles are intact. There is extensive cerumen or blood in the LEFT external canal. Intact internal ear structures.  IMPRESSION: Mixed longitudinal and transverse temporal bone fracture on the RIGHT with ossicular disruption. Vertical disruption of the tegmen tympani likely extends anteriorly to involve the facial nerve in its horizontal/geniculate segment. No apparent pneumolabyrinth.  Minor lateral wall mastoid fracture on the LEFT with layering fluid. External canal is filled with cerumen or blood, but middle ear and inner ear structures grossly intact.   Electronically Signed   By: Davonna BellingJohn  Curnes M.D.   On: 12/07/2013 11:34    Assessment/Plan: Neurologically stable. We'll need follow-up CT this head without contrast on Monday, November 16.   Hewitt ShortsNUDELMAN,ROBERT W, MD 12/09/2013, 7:45 AM

## 2013-12-09 NOTE — Progress Notes (Signed)
Rehab admissions - I have called the workers comp [employer] manager and have left a message requesting acute inpatient rehab admission.  I will follow up with all once I hear back from workers comp.  Call me for questions.  #[phone]

## 2013-12-09 NOTE — PMR Pre-admission (Signed)
PMR Admission Coordinator Pre-Admission Assessment  Patient: Michael Hooper is an 53 y.o., male MRN: [id number] DOB: [birth year] Height: 5\' 10"  (177.8 cm) Weight: 114.7 kg (252 lb 13.9 oz)              Insurance Information HMO:     PPO:       PCP:       IPA:       80/20:       OTHER:   PRIMARY: Workers comp      Policy#: [id number]      Subscriber: Annell Greening CM Name: Luanna Salk      Phone#: [phone]     Fax#: [phone] Pre-Cert#: [id number]      Employer: [employer] Benefits:  Phone #: [phone]     Name:   Eff. Date: 12/06/13 is date of accident     Deduct:        Out of Pocket Max:        Life Max:   CIR:        SNF:   Outpatient:       Co-Pay:   Home Health:        Co-Pay:   DME:       Co-Pay:   Providers:    Emergency Contact Information Contact Information    Name Relation Home Work Mobile   [place] Spouse [phone]       Current Medical History  Patient Admitting Diagnosis: TBI with polytrauma  History of Present Illness: A 54 y.o. 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 He had brief LOC and was evaluated at [facility] on 12/06/13. Work up revealed bilateral temporal bone fractures with extension of right temporal fracture into right sphenoid bone transversing the right sphenoid sinus, disruption of right ossicular chain, basilar skull fracture, L1-L4 transverse process fractures with moderate displacement and associated hemorrhage right quadratus lumborum and neighboring muscles. CTA head/neck negative for carotid or basilar artery injury. Dr. Jule Ser consulted for input and recommended ENT evaluation for Right VI nerve palsy, Right VII peripheral nerve palsy and loss of hearing in the right ear. Conservative management with monitoring for complications/CSF leak recommended. Dr. Kelly Splinter consulted and repaired right ear full thickness laceration and left upper  eye lid stellate laceration post admission. Patient with reports of blurred and double vision right eye with vertigo and decreased hearing and complete right peripheral facial paralysis per Dr. Lazarus Salines. He consulted Dr. Delfino Lovett at [facility] who recommended monitoring for spontaneous recovery with consideration for decompression of facial nerve and Ossiculoplasty later when stabilized. Patient started on high dose steroids for treatment as well as taping OD at nights--to follow up with ENT in a week and Opth consult recommended for diplopia. Therapy evaluations done and patient limited by back pain as well as dizziness with diplopia. MD and rehab team recommending CIR for progressive therapy and patient to be admitted today.  Past Medical History  Past Medical History  Diagnosis Date  . Asthma     Family History  family history includes Asthma in his brother; Emphysema in his mother; Heart failure in his father and mother; Parkinson's disease in his father.  Prior Rehab/Hospitalizations:  None   Current Medications  Current facility-administered medications: 0.9 % NaCl with KCl 20 mEq/ L  infusion, , Intravenous, Continuous, Frederik Schmidt, MD, Last Rate: 50 mL/hr at 12/09/13 0426;  artificial tears (LACRILUBE) ophthalmic  ointment 1 application, 1 application, Right Eye, QHS, Flo ShanksKarol Wolicki, MD, 1 application at 12/08/13 2120;  meclizine (ANTIVERT) tablet 25 mg, 25 mg, Oral, TID PRN, Violeta GelinasBurke Thompson, MD, 25 mg at 12/07/13 1636 morphine 2 MG/ML injection 2-4 mg, 2-4 mg, Intravenous, Q4H PRN, Frederik SchmidtJay Wyatt, MD, 2 mg at 12/08/13 2120;  ondansetron (ZOFRAN) tablet 4 mg, 4 mg, Oral, Q6H PRN **OR** ondansetron (ZOFRAN) injection 4 mg, 4 mg, Intravenous, Q6H PRN, Violeta GelinasBurke Thompson, MD, 4 mg at 12/08/13 [id number];  oxyCODONE (Oxy IR/ROXICODONE) immediate release tablet 5-10 mg, 5-10 mg, Oral, Q4H PRN, Violeta GelinasBurke Thompson, MD, 10 mg at 12/09/13 0948 pantoprazole (PROTONIX) EC tablet 40 mg, 40 mg, Oral, Daily, 40 mg at 12/09/13 0948  **OR** [DISCONTINUED] pantoprazole (PROTONIX) injection 40 mg, 40 mg, Intravenous, Daily, Violeta GelinasBurke Thompson, MD, 40 mg at 12/07/13 1017;  polyvinyl alcohol (LIQUIFILM TEARS) 1.4 % ophthalmic solution 1 drop, 1 drop, Both Eyes, PRN, Juliette Christine Virginia CrewsBates Cooper, RPH predniSONE (DELTASONE) tablet 80 mg, 80 mg, Oral, Q breakfast, Flo ShanksKarol Wolicki, MD, 80 mg at 12/09/13 0815;  tiZANidine (ZANAFLEX) tablet 4 mg, 4 mg, Oral, Q8H PRN, Violeta GelinasBurke Thompson, MD  Patients Current Diet: Diet clear liquid  Precautions / Restrictions Precautions Precautions: Fall Precaution Comments: vertigo symptoms with increased head movement Restrictions Weight Bearing Restrictions: No   Prior Activity Level Community (5-7x/wk): Went out daily.  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   Home Assistive Devices / Equipment Home Assistive Devices/Equipment: Eyeglasses Home Equipment: None  Prior Functional Level Prior Function Level of Independence: Independent Comments: working  Current Functional Level Cognition  Overall Cognitive Status: Difficult to assess Difficult to assess due to: Hard of hearing/deaf Orientation Level: Oriented X4    Extremity Assessment (includes Sensation/Coordination)  Upper Extremity Assessment: Defer to OT evaluation Lower Extremity Assessment: Overall WFL for tasks assessed Cervical / Trunk Assessment: Other exceptions (L1-4 TVP fxs)      ADLs  Overall ADL's : Needs assistance/impaired Grooming: Minimal assistance Grooming Details (indicate cue type and reason): difficulty due to pain,vertigo and facial weakness Upper Body Bathing: Minimal assitance, Bed level Lower Body Bathing: Maximal assistance, Sit to/from stand Upper Body Dressing : Moderate assistance, Bed level Lower Body Dressing: Maximal assistance, Sit to/from stand Functional mobility during ADLs: Minimal assistance, +2 for safety/equipment General ADL Comments: mobilizing with R eye occluded. Educated on  using gaze stabilization during mobility to decrease symptoms of vertigo    Mobility  Overal bed mobility: Needs Assistance Bed Mobility: Rolling, Sidelying to Sit Rolling: Supervision Sidelying to sit: Min assist General bed mobility comments: improved participation with mobility with decreased c/o dizziness and decreased c/o pain    Transfers  Overall transfer level: Needs assistance Equipment used: Rolling walker (2 wheeled) Transfers: Sit to/from Stand Sit to Stand: Min assist Stand pivot transfers: Min assist General transfer comment: improved ability to complete mobility today with decreased c/o dizziness.  pt worked on stabilizing gaze during transfer.      Ambulation / Gait / Stairs / Wheelchair Mobility  Ambulation/Gait Ambulation/Gait assistance: ArchitectMin assist Ambulation Distance (Feet): 70 Feet Assistive device: Rolling walker (2 wheeled) Gait Pattern/deviations: Step-through pattern, Decreased stride length General Gait Details: During first half of gait pt utilized "A" taped to IV pole to stabilize gaze.  2nd person managed IV pole and lines/leads.  During second half of gait pt did not utilize gaze stabilization, but maintained a neutral head position with pt indicating slight increased dizziness than when using "A", but improved from yesterday.  Posture / Balance Overall balance assessment: Needs assistance Sitting-balance support: No upper extremity supported;Feet supported Sitting balance-Leahy Scale: Fair Standing balance support: During functional activity;Bilateral upper extremity supported Standing balance-Leahy Scale: Poor   Special needs/care consideration BiPAP/CPAP No CPM No Continuous Drip IV 0.9% NS with KCL 20 meq/L 50 ml/hr Dialysis No       Life Vest No Oxygen No Special Bed No Trach Size No Wound Vac (area) No    Skin Has right ear stitches and packing; left eye lid stitches; right eye with nerve damage and patch over right eye due to double  vision; has scars from shingles on left chest.          Bowel mgmt: Last BM 12/06/13 Bladder mgmt: Voiding in urinal WDL Diabetic mgmt No    Previous Home Environment Living Arrangements: Spouse/significant other Available Help at Discharge: Family Type of Home: House Home Layout: Two level, Bed/bath upstairs Alternate Level Stairs-Rails: Right Alternate Level Stairs-Number of Steps: flight Home Access: Stairs to enter Secretary/administratorntrance Stairs-Number of Steps: 2 Bathroom Shower/Tub: Engineer, manufacturing systemsTub/shower unit Bathroom Toilet: Standard Bathroom Accessibility: Yes How Accessible: Accessible via walker Home Care Services: No  Discharge Living Setting Plans for Discharge Living Setting: Patient's home, House, Lives with (comment) (Lives with wife.  Wife works.) Type of Home at Discharge: House Discharge Home Layout: Two level, Bed/bath upstairs Alternate Level Stairs-Number of Steps: Flight Discharge Home Access: Stairs to enter Entergy CorporationEntrance Stairs-Number of Steps: 4 steps front entry and 3 steps at garage entry. Does the patient have any problems obtaining your medications?: No  Social/Family/Support Systems Patient Roles: Spouse, Parent (Has a wife and 2 sons.) Contact Information: Alphia KavaBarbara Persad - spouse [phone] Anticipated Caregiver: self and wife Ability/Limitations of Caregiver: Wife works.  She was on disability and has been back to work 1 week.  Only has 4 weeks of unpaid FMLA left.  She feels she needs to work. Caregiver Availability: Evenings only Discharge Plan Discussed with Primary Caregiver: Yes Is Caregiver In Agreement with Plan?: Yes Does Caregiver/Family have Issues with Lodging/Transportation while Pt is in Rehab?: No  Goals/Additional Needs Patient/Family Goal for Rehab: PT/OT/ST mod I goals.  Wife hopeful that patient will be safe and discharge and that she can go back to work.  Wife says she needs to be working while patient is out with his injury. Expected length of stay:  7-10 days Cultural Considerations: None Dietary Needs: Clear liquids Equipment Needs: TBD Additional Information: Approach from patients left side.  He has an injury to his right ear.  Very hard of hearing.  Stand on patient's left side, be sure he can see your face, speak loudly and clearly.  Get up closer to patient than you might normally. Pt/Family Agrees to Admission and willing to participate: Yes Program Orientation Provided & Reviewed with Pt/Caregiver Including Roles  & Responsibilities: Yes  Decrease burden of Care through IP rehab admission: N/A  Possible need for SNF placement upon discharge: Not planned  Patient Condition: This patient's condition remains as documented in the consult dated 12/08/13, in which the Rehabilitation Physician determined and documented that the patient's condition is appropriate for intensive rehabilitative care in an inpatient rehabilitation facility. Will admit to inpatient rehab today.  Preadmission Screen Completed By:  Trish MageLogue, Lecia Esperanza M, 12/09/2013 2:23 PM ______________________________________________________________________   Discussed status with Dr. Wynn BankerKirsteins on 12/09/13 at 1423 and received telephone approval for admission today.  Admission Coordinator:  Trish MageLogue, Juleah Paradise M, time1423/Date11/12/15

## 2013-12-10 ENCOUNTER — Inpatient Hospital Stay (HOSPITAL_COMMUNITY): Payer: Worker's Compensation

## 2013-12-10 ENCOUNTER — Inpatient Hospital Stay (HOSPITAL_COMMUNITY): Payer: Worker's Compensation | Admitting: *Deleted

## 2013-12-10 ENCOUNTER — Inpatient Hospital Stay (HOSPITAL_COMMUNITY): Payer: Worker's Compensation | Admitting: Speech Pathology

## 2013-12-10 DIAGNOSIS — G519 Disorder of facial nerve, unspecified: Secondary | ICD-10-CM

## 2013-12-10 DIAGNOSIS — G510 Bell's palsy: Secondary | ICD-10-CM

## 2013-12-10 DIAGNOSIS — H4920 Sixth [abducent] nerve palsy, unspecified eye: Secondary | ICD-10-CM | POA: Diagnosis present

## 2013-12-10 LAB — COMPREHENSIVE METABOLIC PANEL
ALK PHOS: 67 U/L (ref 39–117)
ALT: 16 U/L (ref 0–53)
AST: 12 U/L (ref 0–37)
Albumin: 3.2 g/dL — ABNORMAL LOW (ref 3.5–5.2)
Anion gap: 11 (ref 5–15)
BUN: 18 mg/dL (ref 6–23)
CO2: 30 mEq/L (ref 19–32)
Calcium: 8.7 mg/dL (ref 8.4–10.5)
Chloride: 97 mEq/L (ref 96–112)
Creatinine, Ser: 0.83 mg/dL (ref 0.50–1.35)
GFR calc non Af Amer: >90 mL/min (ref >90)
GLUCOSE: 97 mg/dL (ref 70–99)
POTASSIUM: 3.6 meq/L — AB (ref 3.7–5.3)
SODIUM: 138 meq/L (ref 137–147)
TOTAL PROTEIN: 6.5 g/dL (ref 6.0–8.3)
Total Bilirubin: 0.6 mg/dL (ref 0.3–1.2)

## 2013-12-10 LAB — CBC WITH DIFFERENTIAL/PLATELET
Basophils Absolute: 0 10*3/uL (ref 0.0–0.1)
Basophils Relative: 0 % (ref 0–1)
EOS ABS: 0.1 10*3/uL (ref 0.0–0.7)
Eosinophils Relative: 1 % (ref 0–5)
HCT: 40.1 % (ref 39.0–52.0)
Hemoglobin: 13.4 g/dL (ref 13.0–17.0)
LYMPHS ABS: 2.1 10*3/uL (ref 0.7–4.0)
LYMPHS PCT: 16 % (ref 12–46)
MCH: 30.9 pg (ref 26.0–34.0)
MCHC: 33.4 g/dL (ref 30.0–36.0)
MCV: 92.6 fL (ref 78.0–100.0)
Monocytes Absolute: 1.4 10*3/uL — ABNORMAL HIGH (ref 0.1–1.0)
Monocytes Relative: 11 % (ref 3–12)
NEUTROS ABS: 9.6 10*3/uL — AB (ref 1.7–7.7)
NEUTROS PCT: 72 % (ref 43–77)
Platelets: 210 10*3/uL (ref 150–400)
RBC: 4.33 MIL/uL (ref 4.22–5.81)
RDW: 12.6 % (ref 11.5–15.5)
WBC: 13.2 10*3/uL — AB (ref 4.0–10.5)

## 2013-12-10 LAB — GLUCOSE, CAPILLARY: GLUCOSE-CAPILLARY: 94 mg/dL (ref 70–99)

## 2013-12-10 LAB — HEMOGLOBIN A1C
HEMOGLOBIN A1C: 5.7 % — AB (ref <5.7)
Mean Plasma Glucose: 117 mg/dL — ABNORMAL HIGH (ref <117)

## 2013-12-10 MED ORDER — SORBITOL 70 % SOLN
60.0000 mL | Status: AC
  Administered 2013-12-10: 60 mL via ORAL
  Filled 2013-12-10: qty 60

## 2013-12-10 MED ORDER — POTASSIUM CHLORIDE CRYS ER 20 MEQ PO TBCR
20.0000 meq | EXTENDED_RELEASE_TABLET | Freq: Every day | ORAL | Status: DC
  Administered 2013-12-10 – 2013-12-16 (×7): 20 meq via ORAL
  Filled 2013-12-10 (×9): qty 1

## 2013-12-10 MED ORDER — TOPIRAMATE 25 MG PO TABS
50.0000 mg | ORAL_TABLET | Freq: Every day | ORAL | Status: DC
  Administered 2013-12-10 – 2013-12-15 (×5): 50 mg via ORAL
  Filled 2013-12-10 (×8): qty 2

## 2013-12-10 NOTE — Evaluation (Addendum)
Speech Language Pathology Assessment and Plan  Patient Details  Name: Michael Hooper MRN: [id number] Date of Birth: [birth year]  SLP Diagnosis: Dysarthria;Dysphagia;Cognitive Impairments  Rehab Potential: Excellent ELOS: 7-9 days    Today's Date: 12/10/2013 SLP Individual Time: 1027-2536 SLP Individual Time Calculation (Michael): 60 Michael   Problem List:  Patient Active Problem List   Diagnosis Date Noted  . 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 Blunt trauma 12/09/2013  . 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 Facial laceration 12/09/2013  . Lumbar transverse process fracture 12/09/2013  . Pneumocephalus, traumatic 12/09/2013  . Traumatic ossicular dislocation 12/09/2013  . Concussion 12/09/2013  . 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 TBI (traumatic brain injury) 12/09/2013  . Skull base fx 12/06/2013   Past Medical History:  Past Medical History  Diagnosis Date  . Asthma    Past Surgical History: History reviewed. No pertinent past surgical history.  Assessment / Plan / Recommendation Clinical Impression Michael Hooper is a 53 y.o. 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 He had brief LOC and was evaluated at [facility] on 12/06/13. Work up revealed bilateral temporal bone fractures with extension of right temporal fracture into right sphenoid bone transversing the right sphenoid sinus, disruption of right ossicular chain, basilar skull fracture, L1-L4 transverse process fractures with moderate displacement and associated hemorrhage right quadratus lumborum and neighboring muscles. CTA head/neck negative for carotid or basilar artery injury. Dr. Rita Ohara consulted for input and recommended ENT evaluation for Right VI nerve palsy, Right VII peripheral nerve palsy and loss of hearing in the right ear.  Conservative management with monitoring for complications/CSF leak recommended. Dr. Migdalia Dk consulted and repaired right ear full thickness laceration and left upper eye lid stellate laceration past admission. Patient with reports of blurred and double vision right eye with vertigo and decreased hearing and complete right peripheral facial paralysis per Dr. Erik Obey. He consulted Dr. Oletta Cohn at [facility] who recommended monitoring for spontaneous recovery with consideration for decompression of facial nerve and Ossiculoplasty later when stabilized. Therapy evaluations done and patient limited by back pain as well as dizziness with diplopia. MD and rehab team recommending CIR for progressive therapy and patient admitted 12/09/13.  Orders received; cognitive-linguistic and bedside swallow evaluation completed.  SLP administered MOCA and patient received a standard score of 29/30; WFL. Patient's errors in the area of recall of new information, especially during delayed retrieval tasks.  Moderate right-sided oral weakness is also present, which impacts safety with PO intake as well as overall speech intelligibility.  Patient would benefit from skilled SLP intervention to maximize his functional independence prior to discharge home.     Skilled Therapeutic Interventions          Cognitive-linguistic and bedside swallow evaluations completed with results and recommendations reviewed with patient.    SLP Assessment  Patient will need skilled Speech Lanaguage Pathology Services during CIR admission    Recommendations  Diet Recommendations: Regular;Thin liquid Liquid Administration via: Cup;Straw Medication Administration: Whole meds with liquid Supervision: Patient able to self feed Compensations: Slow rate;Small sips/bites;Check for pocketing Postural Changes and/or Swallow Maneuvers: Seated upright 90 degrees Oral Care Recommendations: Oral care BID Recommendations for Other Services: Neuropsych  consult Patient destination: Home Follow up Recommendations: Other (comment) (TBD) Equipment Recommended: None recommended by SLP    SLP Frequency 5 out  of 7 days   SLP Treatment/Interventions Cognitive remediation/compensation;Cueing hierarchy;Environmental controls;Functional tasks;Internal/external aids;Medication managment;Oral motor exercises;Patient/family education;Therapeutic Activities    Pain Pain Assessment Pain Assessment: No/denies pain  Prior Functioning Cognitive/Linguistic Baseline: Within functional limits Type of Home: House  Lives With: Spouse Available Help at Discharge: Family;Available PRN/intermittently Education: 12th grade Vocation: Full time employment  Short Term Goals: Week 1: SLP Short Term Goal 1 (Week 1): STGs=LTGs due to length of stay  See FIM for current functional status Refer to Care Plan for Long Term Goals  Recommendations for other services: Neuropsych  Discharge Criteria: Patient will be discharged from SLP if patient refuses treatment 3 consecutive times without medical reason, if treatment goals not met, if there is a change in medical status, if patient makes no progress towards goals or if patient is discharged from hospital.  The above assessment, treatment plan, treatment alternatives and goals were discussed and mutually agreed upon: by patient  Gunnar Fusi, M.A., CCC-SLP [phone]  [name] 12/10/2013, 1:57 PM

## 2013-12-10 NOTE — Progress Notes (Signed)
PMR Admission Coordinator Pre-Admission Assessment  Patient: Michael Hooper Newhouse is an 53 y.o., male MRN: [id number] DOB: April 23, 1960 Height: 5\' 10"  (177.8 cm) Weight: 114.7 kg (252 lb 13.9 oz)  Insurance Information HMO: PPO: PCP: IPA: 80/20: OTHER:  PRIMARY: Workers comp Policy#: [id number] Subscriber: Michael Hooper Moro CM Name: Luanna SalkRuth Watchorn Phone#: [phone] Fax#: [phone] Pre-Cert#: [id number] Employer: [employer] Benefits: Phone #: [phone] Name:  Eff. Date: 12/06/13 is date of accident Deduct: Out of Pocket Max: Life Max:  CIR: SNF:  Outpatient: Co-Pay:  Home Health: Co-Pay:  DME: Co-Pay:  Providers:   Emergency Contact Information Contact Information    Name Relation Home Work Mobile   [place],Barbara Spouse [phone]       Current Medical History  Patient Admitting Diagnosis: TBI with polytrauma  History of Present Illness: A 53 y.o. 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 He had brief LOC and [facility]. Work up revealed bilateral temporal bone fractures with extension of right temporal fracture into right sphenoid bone transversing the right sphenoid sinus, disruption of right ossicular chain, basilar skull fracture, L1-L4 transverse process fractures with moderate displacement and associated hemorrhage right quadratus lumborum and neighboring muscles. CTA head/neck negative for carotid or basilar artery injury. Dr. Jule SerNudleman consulted for input and recommended ENT evaluation for Right VI nerve palsy, Right VII peripheral nerve palsy and loss of hearing in the right ear. Conservative  management with monitoring for complications/CSF leak recommended. Dr. Kelly SplinterSanger consulted and repaired right ear full thickness laceration and left upper eye lid stellate laceration post admission. Patient with reports of blurred and double vision right eye with vertigo and decreased hearing and complete right peripheral facial paralysis per Dr. Lazarus SalinesWolicki. He consulted [facility]elf[facility]Eric [facility] who recommended monitoring for spontaneous recovery with consideration for decompression of facial nerve and Ossiculoplasty later when stabilized. Patient started on high dose steroids for treatment as well as taping OD at nights--to follow up with ENT in a week and Opth consult recommended for diplopia. Therapy evaluations done and patient limited by back pain as well as dizziness with diplopia. MD and rehab team recommending CIR for progressive therapy and patient to be admitted today.  Past Medical History  Past Medical History  Diagnosis Date  . Asthma     Family History  family history includes Asthma in his brother; Emphysema in his mother; Heart failure in his father and mother; Parkinson's disease in his father.  Prior Rehab/Hospitalizations: None  Current Medications  Current facility-administered medications: 0.9 % NaCl with KCl 20 mEq/ L infusion, , Intravenous, Continuous, Frederik SchmidtJay Wyatt, MD, Last Rate: 50 mL/hr at 12/09/13 0426; artificial tears (LACRILUBE) ophthalmic ointment 1 application, 1 application, Right Eye, QHS, Flo ShanksKarol Wolicki, MD, 1 application at 12/08/13 2120; meclizine (ANTIVERT) tablet 25 mg, 25 mg, Oral, TID PRN, Violeta GelinasBurke Thompson, MD, 25 mg at 12/07/13 1636 morphine 2 MG/ML injection 2-4 mg, 2-4 mg, Intravenous, Q4H PRN, Frederik SchmidtJay Wyatt, MD, 2 mg at 12/08/13 2120; ondansetron (ZOFRAN) tablet 4 mg, 4 mg, Oral, Q6H PRN **OR** ondansetron (ZOFRAN) injection 4 mg, 4 mg, Intravenous, Q6H PRN, Violeta GelinasBurke Thompson, MD, 4 mg at 12/08/13 [id number]; oxyCODONE (Oxy IR/ROXICODONE) immediate release  tablet 5-10 mg, 5-10 mg, Oral, Q4H PRN, Violeta GelinasBurke Thompson, MD, 10 mg at 12/09/13 0948 pantoprazole (PROTONIX) EC tablet 40 mg, 40 mg, Oral, Daily, 40 mg at 12/09/13 0948 **OR** [DISCONTINUED] pantoprazole (PROTONIX) injection 40  mg, 40 mg, Intravenous, Daily, Violeta GelinasBurke Thompson, MD, 40 mg at 12/07/13 1017; polyvinyl alcohol (LIQUIFILM TEARS) 1.4 % ophthalmic solution 1 drop, 1 drop, Both Eyes, PRN, [address]te [address] CourtChristine Bates Cooper, RPH predniSONE (DELTASONE) tablet 80 mg, 80 mg, Oral, Q breakfast, Flo ShanksKarol Wolicki, MD, 80 mg at 12/09/13 0815; tiZANidine (ZANAFLEX) tablet 4 mg, 4 mg, Oral, Q8H PRN, Violeta GelinasBurke Thompson, MD  Patients Current Diet: Diet clear liquid  Precautions / Restrictions Precautions Precautions: Fall Precaution Comments: vertigo symptoms with increased head movement Restrictions Weight Bearing Restrictions: No   Prior Activity Level Community (5-7x/wk): Went out daily. 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   Home Assistive Devices / Equipment Home Assistive Devices/Equipment: Eyeglasses Home Equipment: None  Prior Functional Level Prior Function Level of Independence: Independent Comments: working  Current Functional Level Cognition  Overall Cognitive Status: Difficult to assess Difficult to assess due to: Hard of hearing/deaf Orientation Level: Oriented X4   Extremity Assessment (includes Sensation/Coordination)  Upper Extremity Assessment: Defer to OT evaluation Lower Extremity Assessment: Overall WFL for tasks assessed Cervical / Trunk Assessment: Other exceptions (L1-4 TVP fxs)     ADLs  Overall ADL's : Needs assistance/impaired Grooming: Minimal assistance Grooming Details (indicate cue type and reason): difficulty due to pain,vertigo and facial weakness Upper Body Bathing: Minimal assitance, Bed level Lower Body Bathing: Maximal assistance, Sit to/from stand Upper Body Dressing : Moderate assistance, Bed level Lower Body Dressing: Maximal  assistance, Sit to/from stand Functional mobility during ADLs: Minimal assistance, +2 for safety/equipment General ADL Comments: mobilizing with R eye occluded. Educated on using gaze stabilization during mobility to decrease symptoms of vertigo    Mobility  Overal bed mobility: Needs Assistance Bed Mobility: Rolling, Sidelying to Sit Rolling: Supervision Sidelying to sit: Min assist General bed mobility comments: improved participation with mobility with decreased c/o dizziness and decreased c/o pain    Transfers  Overall transfer level: Needs assistance Equipment used: Rolling walker (2 wheeled) Transfers: Sit to/from Stand Sit to Stand: Min assist Stand pivot transfers: Min assist General transfer comment: improved ability to complete mobility today with decreased c/o dizziness. pt worked on stabilizing gaze during transfer.     Ambulation / Gait / Stairs / Wheelchair Mobility  Ambulation/Gait Ambulation/Gait assistance: ArchitectMin assist Ambulation Distance (Feet): 70 Feet Assistive device: Rolling walker (2 wheeled) Gait Pattern/deviations: Step-through pattern, Decreased stride length General Gait Details: During first half of gait pt utilized "A" taped to IV pole to stabilize gaze. 2nd person managed IV pole and lines/leads. During second half of gait pt did not utilize gaze stabilization, but maintained a neutral head position with pt indicating slight increased dizziness than when using "A", but improved from yesterday.     Posture / Balance Overall balance assessment: Needs assistance Sitting-balance support: No upper extremity supported;Feet supported Sitting balance-Leahy Scale: Fair Standing balance support: During functional activity;Bilateral upper extremity supported Standing balance-Leahy Scale: Poor   Special needs/care consideration BiPAP/CPAP No CPM No Continuous Drip IV 0.9% NS with KCL 20 meq/L 50 ml/hr Dialysis No  Life Vest No Oxygen  No Special Bed No Trach Size No Wound Vac (area) No  Skin Has right ear stitches and packing; left eye lid stitches; right eye with nerve damage and patch over right eye due to double vision; has scars from shingles on left chest. Bowel mgmt: Last BM 12/06/13 Bladder mgmt: Voiding in urinal WDL Diabetic mgmt No    Previous Home Environment Living Arrangements: Spouse/significant other Available Help at Discharge: Family Type  of Home: House Home Layout: Two level, Bed/bath upstairs Alternate Level Stairs-Rails: Right Alternate Level Stairs-Number of Steps: flight Home Access: Stairs to enter Entrance Stairs-Number of Steps: 2 Bathroom Shower/Tub: Engineer, manufacturing systems: Standard Bathroom Accessibility: Yes How Accessible: Accessible via walker Home Care Services: No  Discharge Living Setting Plans for Discharge Living Setting: Patient's home, House, Lives with (comment) (Lives with wife. Wife works.) Type of Home at Discharge: House Discharge Home Layout: Two level, Bed/bath upstairs Alternate Level Stairs-Number of Steps: Flight Discharge Home Access: Stairs to enter [employer] of Steps: 4 steps front entry and 3 steps at garage entry. Does the patient have any problems obtaining your medications?: No  Social/Family/Support Systems Patient Roles: Spouse, Parent (Has a wife and 2 sons.) Contact Information: Chozen Latulippe - spouse [phone] Anticipated Caregiver: self and wife Ability/Limitations of Caregiver: Wife works. She was on disability and has been back to work 1 week. Only has 4 weeks of unpaid FMLA left. She feels she needs to work. Caregiver Availability: Evenings only Discharge Plan Discussed with Primary Caregiver: Yes Is Caregiver In Agreement with Plan?: Yes Does Caregiver/Family have Issues with Lodging/Transportation while Pt is in Rehab?: No  Goals/Additional Needs Patient/Family Goal for Rehab: PT/OT/ST mod I  goals. Wife hopeful that patient will be safe and discharge and that she can go back to work. Wife says she needs to be working while patient is out with his injury. Expected length of stay: 7-10 days Cultural Considerations: None Dietary Needs: Clear liquids Equipment Needs: TBD Additional Information: Approach from patients left side. He has an injury to his right ear. Very hard of hearing. Stand on patient's left side, be sure he can see your face, speak loudly and clearly. Get up closer to patient than you might normally. Pt/Family Agrees to Admission and willing to participate: Yes Program Orientation Provided & Reviewed with Pt/Caregiver Including Roles & Responsibilities: Yes  Decrease burden of Care through IP rehab admission: N/A  Possible need for SNF placement upon discharge: Not planned  Patient Condition: This patient's condition remains as documented in the consult dated 12/08/13, in which the Rehabilitation Physician determined and documented that the patient's condition is appropriate for intensive rehabilitative care in an inpatient rehabilitation facility. Will admit to inpatient rehab today.  Preadmission Screen Completed By: Trish Mage, 12/09/2013 2:23 PM ______________________________________________________________________  Discussed status with Dr. Wynn Banker on 12/09/13 at 1423 and received telephone approval for admission today.  Admission Coordinator: Trish Mage time1423/Date11/12/15          Cosigned by: Erick Colace, MD at 12/09/2013 2:44 PM  Revision History

## 2013-12-10 NOTE — Progress Notes (Signed)
Physical Medicine and Rehabilitation Consult  Reason for Consult: TBI, difficulty hearing due to ossicular discontinuity, CN VII nerve palsy and diplopia Referring Physician: Dr. Lindie SpruceWyatt   HPI: Michael GreeningJames Findlay is a 53 y.o. 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 He had brief LOC and [facility]. Work up revealed bilateral temporal bone fractures with extension of right temporal fracture into right sphenoid bone transversing the right sphenoid sinus, disruption of right ossicular chain, basilar skull fracture, L1-L4 transverse process fractures with moderate displacement and associated hemorrhage right quadratus lumborum and neighboring muscles. CTA head/neck negative for carotid or basilar artery injury. Dr. Jule SerNudleman consulted for input and recommended ENT evaluation for Right VI nerve palsy, Right VII peripheral nerve palsy and loss of hearing in the right ear. Conservative management with monitoring for complications/CSF leak recommended. Dr. Kelly SplinterSanger consulted and repaired right ear full thickness laceration and left upper eye lid stellate laceration past admission. Patient with reports of blurred and double vision right eye with vertigo and decreased hearing and complete right peripheral facial paralysis per Dr. Lazarus SalinesWolicki. He [facility]elf[facility]Eric [facility] who recommended monitoring for spontaneous recovery with consideration for decompression of facial nerve and Ossiculoplasty later when stabilized. Patient started on high dose steroids for treatment as well as taping OD at nights--to follow up with ENT in a week and Opth consult recommended for diplopia. Therapy evaluations done yesterday and patient limited by back pain as well as dizziness with diplopia. MD and therapy team recommending CIR for progressive therapy.    Review of Systems  HENT: Positive for  hearing loss. Negative for tinnitus.  Eyes: Positive for blurred vision and double vision.   Right eye discomfort.  Respiratory: Negative for cough and shortness of breath.  Cardiovascular: Negative for chest pain and palpitations.  Gastrointestinal: Negative for heartburn, nausea and abdominal pain.  Genitourinary: Negative for dysuria and urgency.  Musculoskeletal: Positive for myalgias and back pain.  Neurological: Positive for dizziness, sensory change and headaches.  Psychiatric/Behavioral: The patient is nervous/anxious. The patient does not have insomnia.      Past Medical History  Diagnosis Date  . Asthma    History reviewed. No pertinent past surgical history. Family History  Problem Relation Age of Onset  . Heart failure Mother   . Heart failure Father   . Asthma Brother   . Parkinson's disease Father   . Emphysema Mother     Social History: Married and independent PTA. Wife with multiple health problems. He reports that he has never smoked. He does not have any smokeless tobacco history on file. He reports that he drinks about 2.4 oz of alcohol per week. He reports that he does not use illicit drugs.    Allergies: No Known Allergies    No prescriptions prior to admission    Home: Home Living Family/patient expects to be discharged to:: Private residence Living Arrangements: Spouse/significant other Available Help at Discharge: Family Type of Home: House Home Access: Stairs to enter Secretary/administratorntrance Stairs-Number of Steps: 2 Home Layout: Two level, Bed/bath upstairs Alternate Level Stairs-Number of Steps: flight Home Equipment: None  Functional History: Prior Function Level of Independence: Independent Comments: working Functional Status:  Mobility: Bed Mobility Overal bed mobility: Needs Assistance Bed Mobility: Rolling, Sidelying to Sit Rolling: Supervision Sidelying to sit: Min assist General bed mobility  comments: improved  participation with mobility with decreased c/o dizziness and decreased c/o pain Transfers Overall transfer level: Needs assistance Equipment used: Rolling walker (2 wheeled) Transfers: Sit to/from Stand, Stand Pivot Transfers Sit to Stand: Min assist Stand pivot transfers: Min assist General transfer comment: improved ability to complete mobility today with decreased c/o dizziness      ADL: ADL Overall ADL's : Needs assistance/impaired Grooming: Minimal assistance Grooming Details (indicate cue type and reason): difficulty due to pain,vertigo and facial weakness Upper Body Bathing: Minimal assitance, Bed level Lower Body Bathing: Maximal assistance, Sit to/from stand Upper Body Dressing : Moderate assistance, Bed level Lower Body Dressing: Maximal assistance, Sit to/from stand Functional mobility during ADLs: Minimal assistance, +2 for safety/equipment General ADL Comments: mobilizing with R eye occluded. Educated on using gaze stabilization during mobility to decrease symptoms of vertigo  Cognition: Cognition Overall Cognitive Status: (appears WFL. will further assess) Orientation Level: Oriented X4 Cognition Arousal/Alertness: Awake/alert Behavior During Therapy: WFL for tasks assessed/performed Overall Cognitive Status: (appears WFL. will further assess)  Blood pressure 116/77, pulse 70, temperature 98.7 F (37.1 C), temperature source Axillary, resp. rate 8, height 5\' 10"  (1.778 m), weight 114.7 kg (252 lb 13.9 oz), SpO2 97 %. Physical Exam  Nursing note and vitals reviewed. Constitutional: He is oriented to person, place, and time. He appears well-developed and well-nourished.  HENT:  Head: Normocephalic.  Dressing right ear.  Eyes: Pupils are equal, round, and reactive to light. Right conjunctiva is injected. Right eye exhibits nystagmus. Left eye exhibits nystagmus.  Left lid with laceration sutured.  Neck: Normal range of motion. Neck supple.    Cardiovascular: Normal rate and regular rhythm.  Respiratory: Effort normal and breath sounds normal. No respiratory distress. He has no wheezes.  GI: Soft. Bowel sounds are normal. He exhibits no distension. There is no tenderness.  Musculoskeletal: He exhibits no edema or tenderness.  Neurological: He is alert and oriented to person, place, and time.  Speech clear. Follows commands without difficulty. Unable to hear right ear. Right facial paresis. Unable to move right eye laterally and horizontal nystagmus noted. Moves all four extremities equally. No sensory deficits.  Skin: Skin is warm and dry.  Ecchymotic areas mid lumbar spine  Psychiatric: He has a normal mood and affect. His behavior is normal. Judgment and thought content normal.     Lab Results Last 24 Hours    No results found for this or any previous visit (from the past 24 hour(s)).   Ct Angio Head W/cm &/or Wo Cm  12/06/2013 CLINICAL DATA: Hit by forklift. Skull fractures. Rule out arterial injury. EXAM: CT ANGIOGRAPHY HEAD AND NECK TECHNIQUE: Multidetector CT imaging of the head and neck was performed using the standard protocol during bolus administration of intravenous contrast. Multiplanar CT image reconstructions and MIPs were obtained to evaluate the vascular anatomy. Carotid stenosis measurements (when applicable) are obtained utilizing NASCET criteria, using the distal internal carotid diameter as the denominator. CONTRAST: 50mL OMNIPAQUE IOHEXOL 350 MG/ML SOLN COMPARISON: CT the head and cervical spine earlier today FINDINGS: CTA HEAD FINDINGS Ventricle size is normal. No intracranial hemorrhage. There is pneumocephalus with gas in the right cavernous sinus and between the clivus and basilar and suprasellar cistern. This is compatible with basilar skull fractures. Bilateral mastoid sinus fractures are noted. There is gas throughout the deep soft tissues of the neck presumably related to the skullbase  fractures. Air-fluid levels in the right maxillary and sphenoid sinuses. Both vertebral arteries are normal. The basilar is normal. Cerebellar  and posterior cerebral arteries are normal. Cavernous carotid is normal bilaterally. No evidence of carotid dissection or injury. Petrous segment is normal. Anterior and middle cerebral arteries are normal. Negative for aneurysm. Review of the MIP images confirms the above findings. CTA NECK FINDINGS Proximal great vessels are normal. No mass or adenopathy in the neck. Carotid artery is normal bilaterally. Negative for carotid injury. No atherosclerotic disease or dissection. Both vertebral arteries are equal in size and widely patent. No significant atherosclerotic disease or dissection. Review of the MIP images confirms the above findings. IMPRESSION: Negative for arterial injury of the carotid or basilar artery bilaterally. Skullbase fractures with pneumocephalus. Electronically Signed By: Marlan Palau M.D. On: 12/06/2013 18:13    Ct Head Wo Contrast  12/06/2013 CLINICAL DATA: Patient was pinned between a forklift and rack at work, difficult hearing in both ears, lacerations to LEFT face and LEFT eye EXAM: CT HEAD WITHOUT CONTRAST CT MAXILLOFACIAL WITHOUT CONTRAST CT CERVICAL SPINE WITHOUT CONTRAST TECHNIQUE: Multidetector CT imaging of the head, cervical spine, and maxillofacial structures were performed using the standard protocol without intravenous contrast. Multiplanar CT image reconstructions of the cervical spine and maxillofacial structures were also generated. RIGHT-side of face marked with a BB. COMPARISON: None. FINDINGS: CT HEAD FINDINGS Normal ventricular morphology. No midline shift or mass effect. Normal appearance of brain parenchyma. No intracranial hemorrhage, mass lesion or evidence acute infarction. Air present at the cavernous sinuses bilaterally, in the sella turcica, and suprasellar compatible with basilar skull  fracture. Partially opacified mastoid air cells bilaterally. Air-fluid levels in RIGHT maxillary and sphenoid sinuses with partial ethmoid opacification bilaterally. No calvarial fracture identified. CT MAXILLOFACIAL FINDINGS Air present within sella turcica, cavernous sinuses and suprasellar region. Air-fluid level/hemorrhage within RIGHT sphenoid sinus and RIGHT maxillary sinus. Partial opacification of ethmoid air cells bilaterally. Small mucosal retention cyst LEFT maxillary sinus. Frontal sinus and LEFT maxillary sinus otherwise clear. Partial opacification of mastoid air cells bilaterally. Alignment of the LEFT ossicles grossly normal. Disruption of RIGHT ossicular chain with displacement of the malleus. RIGHT temporal bone fracture extends to the RIGHT sphenoid across the lateral and anterior walls of the RIGHT sphenoid sinus. No definite fractures of the orbits, maxillary sinuses or zygomas. Soft tissue gas identified anterior and inferior to the mastoid air cells bilaterally and tracking inferiorly into the parapharyngeal spaces. Gas is seen near the courses of the internal carotid arteries bilaterally at the carotid siphons but no discrete fracture planes involving the siphons are visualized. Intraorbital soft tissue planes clear. CT CERVICAL SPINE FINDINGS Partial BILATERAL mastoid air cell opacification. Prevertebral soft tissues normal thickness. Minimal scattered disc space narrowing and endplate spur formation. Vertebral body heights maintained. No acute cervical spine fracture, subluxation, or bone destruction. Lung apices clear. IMPRESSION: No acute intracranial abnormalities. Degenerative disc disease changes of the cervical spine. No acute cervical spine abnormalities. BILATERAL temporal bone fractures with partial opacification of the mastoid air cells bilaterally. Disruption of the RIGHT ossicular chain. Extension of RIGHT temporal fracture into the RIGHT sphenoid  bone and traversing the RIGHT sphenoid sinus. Though no definite involvement of the carotid canals is seen bilaterally, fracture planes particularly on the RIGHT are in close proximity to the carotid canals and if there is clinical concern for internal carotid injury or presence of neurologic impairment then consider CTA head to assess the internal carotid arteries at the skullbase. Associated soft tissue gas caudal to the skullbase as well as intracranial gas at the cavernous sinuses, sella turcica and suprasellar regions. Findings  called to Dr. Modesto Charon on 12/06/2013 at 1650 hr. Electronically Signed By: Ulyses Southward M.D. On: 12/06/2013 16:52   Ct Angio Neck W/cm &/or Wo/cm  12/06/2013 CLINICAL DATA: Hit by forklift. Skull fractures. Rule out arterial injury. EXAM: CT ANGIOGRAPHY HEAD AND NECK TECHNIQUE: Multidetector CT imaging of the head and neck was performed using the standard protocol during bolus administration of intravenous contrast. Multiplanar CT image reconstructions and MIPs were obtained to evaluate the vascular anatomy. Carotid stenosis measurements (when applicable) are obtained utilizing NASCET criteria, using the distal internal carotid diameter as the denominator. CONTRAST: 50mL OMNIPAQUE IOHEXOL 350 MG/ML SOLN COMPARISON: CT the head and cervical spine earlier today FINDINGS: CTA HEAD FINDINGS Ventricle size is normal. No intracranial hemorrhage. There is pneumocephalus with gas in the right cavernous sinus and between the clivus and basilar and suprasellar cistern. This is compatible with basilar skull fractures. Bilateral mastoid sinus fractures are noted. There is gas throughout the deep soft tissues of the neck presumably related to the skullbase fractures. Air-fluid levels in the right maxillary and sphenoid sinuses. Both vertebral arteries are normal. The basilar is normal. Cerebellar and posterior cerebral arteries are normal. Cavernous carotid is normal bilaterally.  No evidence of carotid dissection or injury. Petrous segment is normal. Anterior and middle cerebral arteries are normal. Negative for aneurysm. Review of the MIP images confirms the above findings. CTA NECK FINDINGS Proximal great vessels are normal. No mass or adenopathy in the neck. Carotid artery is normal bilaterally. Negative for carotid injury. No atherosclerotic disease or dissection. Both vertebral arteries are equal in size and widely patent. No significant atherosclerotic disease or dissection. Review of the MIP images confirms the above findings. IMPRESSION: Negative for arterial injury of the carotid or basilar artery bilaterally. Skullbase fractures with pneumocephalus. Electronically Signed By: Marlan Palau M.D. On: 12/06/2013 18:13   Ct Chest W Contrast   12/06/2013 CLINICAL DATA: Trauma. Tenth between 4 clip in and around back. Severe right lower quadrant pain. Right arm limited range of motion. EXAM: CT CHEST, ABDOMEN, AND PELVIS WITH CONTRAST TECHNIQUE: Multidetector CT imaging of the chest, abdomen and pelvis was performed following the standard protocol during bolus administration of intravenous contrast. CONTRAST: 90 cc Omnipaque 300 intravenous COMPARISON: None. FINDINGS: CT CHEST FINDINGS THORACIC INLET/BODY WALL: No acute abnormality. MEDIASTINUM: Normal heart size. No pericardial effusion. No acute vascular abnormality. No adenopathy. LUNG WINDOWS: No contusion, hemothorax, or pneumothorax. Mild scattered atelectasis. Incidental air trapping in the apical left upper lobe; noted history of asthma. OSSEOUS: See below CT ABDOMEN AND PELVIS FINDINGS BODY WALL: Subcutaneous reticulation in the right lower lumbar region consistent with contusion. Liver: No focal abnormality. Biliary: No evidence of biliary obstruction or stone. Pancreas: Unremarkable. Spleen: Unremarkable. Adrenals: Unremarkable. Kidneys and ureters: No evidence of injury. Bladder:  Unremarkable. Reproductive: Unremarkable. Bowel: No evidence of injury. Extensive distal colonic diverticulosis. Retroperitoneum: No mass or adenopathy. Peritoneum: No free fluid or gas. Vascular: No acute findings. OSSEOUS: No findings to explain right shoulder symptoms. Note that the upper scapula, humerus, and distal clavicle is not visualized. L1 through L4 right transverse process fractures with up to moderate displacement/distraction. There is associated hemorrhage within the right quadratus lumborum and neighboring deep back muscles. No measurable hematoma or evidence of active hemorrhage. IMPRESSION: 1. L1 through L4 right transverse process fractures. 2. No acute intrathoracic or intra-abdominal findings. 3. Extensive colonic diverticulosis. Electronically Signed By: Tiburcio Pea M.D. On: 12/06/2013 16:04    Dg Pelvis Portable  12/06/2013 CLINICAL DATA: The patient was  pinned between the wall and a forklift well operating a forklift. Facial trauma. EXAM: PORTABLE PELVIS 1-2 VIEWS COMPARISON: None. FINDINGS: There is no evidence of pelvic fracture or dislocation. There are mild degenerative joint changes with narrowed bilateral hip joint spaces. IMPRESSION: No acute fracture or dislocation. Electronically Signed By: Sherian Rein M.D. 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 Facial trauma Left eyelid, missing tooth top left and Right ear. LOC after intial accident, back at baseline. Back pain. EXAM: PORTABLE CHEST - 1 VIEW COMPARISON: None. FINDINGS: The heart size and mediastinal contours are within normal limits. Both lungs are clear. The visualized skeletal structures are unremarkable. IMPRESSION: No active disease. Electronically Signed By: Elige Ko On: 12/06/2013 15:00   Ct Maxillofacial Wo Cm  12/06/2013 CLINICAL DATA: Patient  was pinned between a forklift and rack at work, difficult hearing in both ears, lacerations to LEFT face and LEFT eye EXAM: CT HEAD WITHOUT CONTRAST CT MAXILLOFACIAL WITHOUT CONTRAST CT CERVICAL SPINE WITHOUT CONTRAST TECHNIQUE: Multidetector CT imaging of the head, cervical spine, and maxillofacial structures were performed using the standard protocol without intravenous contrast. Multiplanar CT image reconstructions of the cervical spine and maxillofacial structures were also generated. RIGHT-side of face marked with a BB. COMPARISON: None. FINDINGS: CT HEAD FINDINGS Normal ventricular morphology. No midline shift or mass effect. Normal appearance of brain parenchyma. No intracranial hemorrhage, mass lesion or evidence acute infarction. Air present at the cavernous sinuses bilaterally, in the sella turcica, and suprasellar compatible with basilar skull fracture. Partially opacified mastoid air cells bilaterally. Air-fluid levels in RIGHT maxillary and sphenoid sinuses with partial ethmoid opacification bilaterally. No calvarial fracture identified. CT MAXILLOFACIAL FINDINGS Air present within sella turcica, cavernous sinuses and suprasellar region. Air-fluid level/hemorrhage within RIGHT sphenoid sinus and RIGHT maxillary sinus. Partial opacification of ethmoid air cells bilaterally. Small mucosal retention cyst LEFT maxillary sinus. Frontal sinus and LEFT maxillary sinus otherwise clear. Partial opacification of mastoid air cells bilaterally. Alignment of the LEFT ossicles grossly normal. Disruption of RIGHT ossicular chain with displacement of the malleus. RIGHT temporal bone fracture extends to the RIGHT sphenoid across the lateral and anterior walls of the RIGHT sphenoid sinus. No definite fractures of the orbits, maxillary sinuses or zygomas. Soft tissue gas identified anterior and inferior to the mastoid air cells bilaterally and tracking inferiorly into the parapharyngeal spaces.  Gas is seen near the courses of the internal carotid arteries bilaterally at the carotid siphons but no discrete fracture planes involving the siphons are visualized. Intraorbital soft tissue planes clear. CT CERVICAL SPINE FINDINGS Partial BILATERAL mastoid air cell opacification. Prevertebral soft tissues normal thickness. Minimal scattered disc space narrowing and endplate spur formation. Vertebral body heights maintained. No acute cervical spine fracture, subluxation, or bone destruction. Lung apices clear. IMPRESSION: No acute intracranial abnormalities. Degenerative disc disease changes of the cervical spine. No acute cervical spine abnormalities. BILATERAL temporal bone fractures with partial opacification of the mastoid air cells bilaterally. Disruption of the RIGHT ossicular chain. Extension of RIGHT temporal fracture into the RIGHT sphenoid bone and traversing the RIGHT sphenoid sinus. Though no definite involvement of the carotid canals is seen bilaterally, fracture planes particularly on the RIGHT are in close proximity to the carotid canals and if there is clinical concern for internal carotid injury or presence of neurologic impairment then consider CTA head to assess the internal carotid arteries at the skullbase. Associated  soft tissue gas caudal to the skullbase as well as intracranial gas at the cavernous sinuses, sella turcica and suprasellar regions. Findings called to Dr. Modesto Charon on 12/06/2013 at 1650 hr. Electronically Signed By: Ulyses Southward M.D. On: 12/06/2013 16:52   Ct Temporal Bones W/o Cm  12/07/2013 CLINICAL DATA: Fork lift crush injury. 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 EXAM: CT HEAD AND TEMPORAL BONES WITHOUT CONTRAST TECHNIQUE: Contiguous axial images were obtained from the base of the skull through the vertex without contrast. Multidetector CT imaging of the temporal bones was performed using the standard protocol without  intravenous contrast. COMPARISON: CT head performed yesterday. CT angio head neck also performed yesterday. FINDINGS: CT HEAD FINDINGS Continued suprasellar pneumocephalus status post basilar skull fracture. Air-fluid level with with hemorrhage RIGHT division sphenoid with pneumocephalus construct in the suprasellar cistern. No acute stroke or parenchymal hemorrhage. No midline shift or hydrocephalus. No extra-axial fluid. No calvarial fracture. Extensive air in the infratemporal extracranial soft tissues. Layering blood RIGHT maxillary sinus. BILATERAL ethmoid fluid/blood. Convexity pneumocephalus. CT TEMPORAL BONES FINDINGS There is a mixed longitudinal and transverse temporal bone fracture on the RIGHT with significant hemotympanum and blood layering in the mastoid air cells. Lateral wall mastoid fracture best seen on coronal imaging. Fracture extends into the posterior wall of the external auditory canal where there is significant blood lateral to the tympanic membrane. The middle ear cavity contains a moderate amount of blood inferiorly and anteriorly, with possible fracture of the scutum. There is disruption of the ossicular chain, with the head of the malleus displaced anteriorly and medially. There is disrupted tegmen tympani as seen on image 27 coronal, with possible fracture involving the horizontal and genicular segments of the RIGHT facial nerve canal. No pneumolabyrinth, either the vestibule or cochlea. Air is seen in the oval window near the expected location of the stapes. No internal auditory canal fracture. On the LEFT there is relatively minor changes of lateral mastoid wall fracture as seen on image 74 series 305. Ossicles are intact. There is extensive cerumen or blood in the LEFT external canal. Intact internal ear structures. IMPRESSION: Mixed longitudinal and transverse temporal bone fracture on the RIGHT with ossicular disruption. Vertical disruption of the tegmen tympani likely  extends anteriorly to involve the facial nerve in its horizontal/geniculate segment. No apparent pneumolabyrinth. Minor lateral wall mastoid fracture on the LEFT with layering fluid. External canal is filled with cerumen or blood, but middle ear and inner ear structures grossly intact. Electronically Signed By: Davonna Belling M.D. On: 12/07/2013 11:34    Assessment/Plan: Diagnosis: TBI with polytrauma 1. Does the need for close, 24 hr/day medical supervision in concert with the patient's rehab needs make it unreasonable for this patient to be served in a less intensive setting? Yes 2. Co-Morbidities requiring supervision/potential complications: see above 3. Due to bladder management, bowel management, safety, skin/wound care, disease management, medication administration and pain management, does the patient require 24 hr/day rehab nursing? Yes 4. Does the patient require coordinated care of a physician, rehab nurse, PT (1-2 hrs/day, 5 days/week), OT (1-2 hrs/day, 5 days/week) and SLP (1-2 hrs/day, 5 days/week) to address physical and functional deficits in the context of the above medical diagnosis(es)? Yes Addressing deficits in the following areas: balance, endurance, locomotion, strength, transferring, bowel/bladder control, bathing, dressing, feeding, grooming, toileting, cognition, speech and psychosocial support 5. Can the patient actively participate in an intensive therapy program of at least 3 hrs of therapy per day at least  5 days per week? Yes 6. The potential for patient to make measurable gains while on inpatient rehab is excellent 7. Anticipated functional outcomes upon discharge from inpatient rehab are modified independent with PT, modified independent with OT, modified independent with SLP. 8. Estimated rehab length of stay to reach the above functional goals is: 7-10 days 9. Does the patient have adequate social supports to accommodate these discharge functional goals?  Yes 10. Anticipated D/C setting: Home 11. Anticipated post D/C treatments: HH therapy and Outpatient therapy 12. Overall Rehab/Functional Prognosis: excellent  RECOMMENDATIONS: This patient's condition is appropriate for continued rehabilitative care in the following setting: CIR Patient has agreed to participate in recommended program. Yes Note that insurance prior authorization may be required for reimbursement for recommended care.  Comment: Rehab Admissions Coordinator to follow up.  Thanks,  Ranelle Oyster, MD, [place] [facility]     12/08/2013

## 2013-12-10 NOTE — Evaluation (Signed)
Physical Therapy Assessment and Plan  Patient Details  Name: Michael Hooper MRN: [id number] Date of Birth: 12/10/1960  PT Diagnosis: Abnormality of gait, Coordination disorder, Dizziness and giddiness, Pain in head and low back and Vertigo Rehab Potential: Excellent ELOS: 7-9 days   Today's Date: 12/10/2013 PT Individual Time: 1000-1100 PT Individual Time Calculation (min): 60 min    Problem List:  Patient Active Problem List   Diagnosis Date Noted  . 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 Blunt trauma 12/09/2013  . 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 Facial laceration 12/09/2013  . Lumbar transverse process fracture 12/09/2013  . Pneumocephalus, traumatic 12/09/2013  . Traumatic ossicular dislocation 12/09/2013  . Concussion 12/09/2013  . 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 TBI (traumatic brain injury) 12/09/2013  . Skull base fx 12/06/2013    Past Medical History:  Past Medical History  Diagnosis Date  . Asthma    Past Surgical History: History reviewed. No pertinent past surgical history.  Assessment & Plan Clinical Impression: Michael Hooper is a 53 y.o. 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 He had brief LOC and was evaluated at [facility] on 12/06/13. Work up revealed bilateral temporal bone fractures with extension of right temporal fracture into right sphenoid bone transversing the right sphenoid sinus, disruption of right ossicular chain, basilar skull fracture, L1-L4 transverse process fractures with moderate displacement and associated hemorrhage right quadratus lumborum and neighboring muscles. CTA head/neck negative for carotid or basilar artery injury. Dr. Rita Ohara consulted for input and recommended ENT evaluation for Right VI nerve palsy, Right VII peripheral nerve palsy and loss  of hearing in the right ear. Conservative management with monitoring for complications/CSF leak recommended. Dr. Migdalia Dk consulted and repaired right ear full thickness laceration and left upper eye lid stellate laceration past admission. Patient with reports of blurred and double vision right eye with vertigo and decreased hearing and complete right peripheral facial paralysis per Dr. Erik Obey. He consulted Dr. Oletta Cohn at [facility] who recommended monitoring for spontaneous recovery with consideration for decompression of facial nerve and Ossiculoplasty later when stabilized. Patient started on high dose steroids for treatment as well as taping OD at nights--to follow up with ENT in a week and Opth consult recommended for diplopia. Therapy evaluations done and patient limited by back pain as well as dizziness with diplopia. MD and rehab team recommending CIR for progressive therapy and patient admitted today. Patient transferred to CIR on 12/09/2013 .   Patient currently requires min with mobility secondary to decreased cardiorespiratoy endurance, decreased coordination, decreased visual acuity, decreased visual perceptual skills, decreased visual motor skills and (in R eye), vestibular issues of unknown origin and decreased standing balance and decreased balance strategies.  Prior to hospitalization, patient was independent  with mobility and lived with Spouse in a House home.  Home access is 3 to platform then 1 into houseStairs to enter.  Patient will benefit from skilled PT intervention to maximize safe functional mobility, minimize fall risk and decrease caregiver burden for planned discharge home with mod I goals/intermittent assist.  Anticipate patient will benefit from OPPT if dizziness/vertigo symptoms persist past discharge at discharge.  PT - End of Session Activity Tolerance: Tolerates 30+ min activity with multiple rests Endurance Deficit: Yes Endurance Deficit Description: fatigues quickly,  requires frequent rest breaks PT Assessment Rehab  Potential (ACUTE/IP ONLY): Excellent PT Patient demonstrates impairments in the following area(s): Balance;Endurance;Motor;Pain;Safety;Sensory;Skin Integrity PT Transfers Functional Problem(s): Bed Mobility;Bed to Chair;Car;Furniture;Floor PT Locomotion Functional Problem(s): Ambulation;Wheelchair Mobility;Stairs PT Plan PT Intensity: Minimum of 1-2 x/day ,45 to 90 minutes PT Frequency: 5 out of 7 days PT Treatment/Interventions: Ambulation/gait training;Disease management/prevention;Pain management;Stair training;Visual/perceptual remediation/compensation;Balance/vestibular training;DME/adaptive equipment instruction;Patient/family education;Therapeutic Activities;Wheelchair propulsion/positioning;Therapeutic Exercise;Psychosocial support;Cognitive remediation/compensation;Community reintegration;Functional mobility training;Skin care/wound management;UE/LE Strength taining/ROM;UE/LE Coordination activities;Discharge planning;Neuromuscular re-education PT Recommendation Recommendations for Other Services: Speech consult Follow Up Recommendations: Home health PT;Outpatient PT;24 hour supervision/assistance (HHPT vs. OPPT TBD upon discharge; 24/7 supervision and assist TBD upon discharge) Patient destination: Home Equipment Recommended: To be determined Equipment Details: Patient does not own any DME; recommendations TBD upon discharge.l  Skilled Therapeutic Intervention Skilled therapeutic intervention initiated after completion of evaluation. Discussed falls risk, safety within room, and focus of therapy during stay. Discussed possible LOS, goals, and f/u therapy.  PT Evaluation Precautions/Restrictions Precautions Precautions: Fall Precaution Comments: vertigo symptoms with increased head movement Restrictions Weight Bearing Restrictions: No General Chart Reviewed: Yes Family/Caregiver Present: Yes (wife)  Pain Pain Assessment Pain  Assessment: 0-10 Pain Score: 5  Pain Type: Acute pain Pain Location: Back Pain Orientation: Lower Pain Descriptors / Indicators: Aching Pain Onset: On-going Pain Intervention(s): Medication (See eMAR) Multiple Pain Sites: No Home Living/Prior Functioning Home Living Available Help at Discharge: Family;Available PRN/intermittently (wife works; 2 sons, one works days and the other works nights) Type of Home: [employer] Home Access: Stairs to enter [employer] of Steps: 3 to platform then 1 into house Entrance Stairs-Rails: None Home Layout: Two level;Bed/bath upstairs Alternate Level Stairs-Number of Steps: flight Alternate Level Stairs-Rails: Right  Lives With: Spouse Prior Function Level of Independence: Independent with basic ADLs;Independent with homemaking with ambulation;Independent with gait;Independent with transfers  Able to Take Stairs?: Yes Driving: Yes Vocation: Full time employment Leisure: Hobbies-yes (Comment) Comments: likes football and his dogs Vision/Perception  Vision - Assessment Eye Alignment: Impaired (comment) Ocular Range of Motion: Restricted on the right Alignment/Gaze Preference: Head turned Tracking/Visual Pursuits: Right eye does not track laterally;Decreased smoothness of eye movement to RIGHT superior field;Decreased smoothness of eye movement to RIGHT inferior field;Decreased smoothness of horizontal tracking Saccades: Additional head turns occurred during testing Diplopia Assessment: Objects split side to side Additional Comments: Using eye patch, alternating every 2 hours  Cognition Overall Cognitive Status: Within Functional Limits for tasks assessed Arousal/Alertness: Awake/alert Orientation Level: Oriented X4 Attention: Selective Sustained Attention: Appears intact Selective Attention: Appears intact Memory: Impaired Memory Impairment: Retrieval deficit;Decreased recall of new information (with delay) Awareness: Appears  intact Problem Solving: Appears intact Safety/Judgment: Appears intact [employer] Scales of Cognitive Functioning: Purposeful/appropriate Sensation Sensation Light Touch: Appears Intact Hot/Cold: Appears Intact Proprioception: Appears Intact Coordination Gross Motor Movements are Fluid and Coordinated: Yes Fine Motor Movements are Fluid and Coordinated: Yes Motor  Motor Motor: Within Functional Limits  Mobility Bed Mobility Bed Mobility: Supine to Sit;Sit to Supine Supine to Sit: 5: Supervision;HOB flat Supine to Sit Details: Verbal cues for sequencing Sit to Supine: 5: Supervision;HOB flat Sit to Supine - Details: Verbal cues for sequencing Transfers Transfers: Yes Sit to Stand: 5: Supervision;With armrests;With upper extremity assist;Without upper extremity assist;From bed;From chair/3-in-1 Sit to Stand Details: Verbal cues for technique Stand to Sit: 5: Supervision;To chair/3-in-1;To bed;With upper extremity assist;Without upper extremity assist;With armrests Stand to Sit Details (indicate cue type and reason): Verbal cues for technique Stand to Sit Details: verbal cues for hand placement Stand Pivot Transfers: 4: Min guard;With armrests;5:  Supervision (with and without RW) Stand Pivot Transfer Details: Verbal cues for technique;Verbal cues for sequencing Locomotion  Ambulation Ambulation: Yes Ambulation/Gait Assistance: 4: Min guard;5: Supervision Ambulation Distance (Feet): 140 Feet Assistive device: Rolling walker Ambulation/Gait Assistance Details: Verbal cues for precautions/safety;Verbal cues for safe use of DME/AE;Tactile cues for posture Ambulation/Gait Assistance Details: Patient requires min verbal cues for negotiation of obstacles on R side due to patient wearing eye patch on R eye and with limited head movements/decreased ability to scan due to dizziness being induced. Gait Gait: Yes Gait Pattern: Within Functional Limits Gait Pattern: Step-through  pattern Stairs / Additional Locomotion Stairs: Yes Stairs Assistance: 4: Min guard Stair Management Technique: Two rails;Alternating pattern;Forwards Number of Stairs: 5 Height of Stairs: 6 Wheelchair Mobility Wheelchair Mobility: Yes Wheelchair Assistance: 1: +1 Total assist Wheelchair Parts Management: Needs assistance Distance: 150  Trunk/Postural Assessment  Cervical Assessment Cervical Assessment: Exceptions to [facility] (head movements limitd secondary to vertigo symptoms) Thoracic Assessment Thoracic Assessment: Within Functional Limits Lumbar Assessment Lumbar Assessment: Within Functional Limits Postural Control Postural Control: Deficits on evaluation Righting Reactions: delayed Protective Responses: delayed  Balance Balance Balance Assessed: Yes Standardized Balance Assessment Standardized Balance Assessment: Berg Balance Test;Timed Up and Go Test Berg Balance Test Sit to Stand: Able to stand without using hands and stabilize independently Standing Unsupported: Able to stand 2 minutes with supervision Sitting with Back Unsupported but Feet Supported on Floor or Stool: Able to sit safely and securely 2 minutes Stand to Sit: Sits safely with minimal use of hands Transfers: Able to transfer with verbal cueing and /or supervision Standing Unsupported with Eyes Closed: Able to stand 10 seconds with supervision Standing Ubsupported with Feet Together: Able to place feet together independently and stand for 1 minute with supervision From Standing, Reach Forward with Outstretched Arm: Can reach confidently >25 cm (10") From Standing Position, Pick up Object from Floor: Able to pick up shoe, needs supervision From Standing Position, Turn to Look Behind Over each Shoulder: Looks behind from both sides and weight shifts well Turn 360 Degrees: Needs close supervision or verbal cueing Standing Unsupported, Alternately Place Feet on Step/Stool: Able to complete >2 steps/needs minimal  assist Standing Unsupported, One Foot in Front: Able to take small step independently and hold 30 seconds Standing on One Leg: Able to lift leg independently and hold 5-10 seconds Total Score: 41/56, indicating patient is at moderate risk for falls (>80%). Timed Up and Go Test TUG: Normal TUG Normal TUG (seconds): 16.63 (average of 3 trial; performed with RW); Scores >13.5" indicate higher risk for falls Static Sitting Balance Static Sitting - Balance Support: Feet unsupported;Feet supported;No upper extremity supported;Bilateral upper extremity supported Static Sitting - Level of Assistance: 6: Modified independent (Device/Increase time) Dynamic Sitting Balance Dynamic Sitting - Balance Support: Feet supported;Left upper extremity supported;Right upper extremity supported Dynamic Sitting - Level of Assistance: 5: Stand by assistance Static Standing Balance Static Standing - Balance Support: During functional activity;Bilateral upper extremity supported;No upper extremity supported Static Standing - Level of Assistance: 4: Min assist;5: Stand by assistance Dynamic Standing Balance Dynamic Standing - Balance Support: During functional activity;Bilateral upper extremity supported;No upper extremity supported Dynamic Standing - Level of Assistance: 4: Min assist;5: Stand by assistance Dynamic Standing - Balance Activities: Forward lean/weight shifting;Lateral lean/weight shifting;Reaching for objects Extremity Assessment  RUE Assessment RUE Assessment: Within Functional Limits (5/5 strength) LUE Assessment LUE Assessment: Within Functional Limits (5/5 strength) RLE Assessment RLE Assessment: Within Functional Limits LLE Assessment LLE Assessment: Within Functional Limits  FIM:  FIM - Control and instrumentation engineer Devices: Arm rests;Walker Bed/Chair Transfer: 5: Supine > Sit: Supervision (verbal cues/safety issues);5: Sit > Supine: Supervision (verbal cues/safety  issues);4: Bed > Chair or W/C: Min A (steadying Pt. > 75%);4: Chair or W/C > Bed: Min A (steadying Pt. > 75%) FIM - Locomotion: Wheelchair Distance: 150 Locomotion: Wheelchair: 1: Total Assistance/staff pushes wheelchair (Pt<25%) FIM - Locomotion: Ambulation Locomotion: Ambulation Assistive Devices: Administrator Ambulation/Gait Assistance: 4: Min guard;5: Supervision Locomotion: Ambulation: 2: Travels 50 - 149 ft with minimal assistance (Pt.>75%) FIM - Locomotion: Stairs Locomotion: Scientist, physiological: Hand rail - 2 Locomotion: Stairs: 2: Up and Down 4 - 11 stairs with minimal assistance (Pt.>75%)   Refer to Care Plan for Long Term Goals  Recommendations for other services: Neuropsych and Other: vestibular  Discharge Criteria: Patient will be discharged from PT if patient refuses treatment 3 consecutive times without medical reason, if treatment goals not met, if there is a change in medical status, if patient makes no progress towards goals or if patient is discharged from hospital.  The above assessment, treatment plan, treatment alternatives and goals were discussed and mutually agreed upon: by patient  Lillia Abed. Deltha Bernales, PT, DPT 12/10/2013, 1:47 PM

## 2013-12-10 NOTE — Evaluation (Signed)
Occupational Therapy Assessment and Plan  Patient Details  Name: Michael Hooper MRN: [id number] Date of Birth: Sep 05, 1960  OT Diagnosis: acute pain, disturbance of vision, muscle weakness (generalized) and pain in joint Rehab Potential: Excellent ELOS: 7-9 days   Today's Date: 12/10/2013 OT Individual Time: 4765-4650 and 3546-5681 OT Individual Time Calculation (min): 60 min and 30 min      Problem List:  Patient Active Problem List   Diagnosis Date Noted  . 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 Blunt trauma 12/09/2013  . 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 Facial laceration 12/09/2013  . Lumbar transverse process fracture 12/09/2013  . Pneumocephalus, traumatic 12/09/2013  . Traumatic ossicular dislocation 12/09/2013  . Concussion 12/09/2013  . 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 TBI (traumatic brain injury) 12/09/2013  . Skull base fx 12/06/2013    Past Medical History:  Past Medical History  Diagnosis Date  . Asthma    Past Surgical History: History reviewed. No pertinent past surgical history.  Assessment & Plan Clinical Impression: Michael Hooper is a 53 y.o. 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 He had brief LOC and was evaluated at [facility] on 12/06/13. Work up revealed bilateral temporal bone fractures with extension of right temporal fracture into right sphenoid bone transversing the right sphenoid sinus, disruption of right ossicular chain, basilar skull fracture, L1-L4 transverse process fractures with moderate displacement and associated hemorrhage right quadratus lumborum and neighboring muscles. CTA head/neck negative for carotid or basilar artery injury. Dr. Rita Ohara consulted for input and recommended ENT evaluation for Right VI nerve palsy, Right VII peripheral nerve palsy  and loss of hearing in the right ear. Conservative management with monitoring for complications/CSF leak recommended. Dr. Migdalia Dk consulted and repaired right ear full thickness laceration and left upper eye lid stellate laceration past admission. Patient with reports of blurred and double vision right eye with vertigo and decreased hearing and complete right peripheral facial paralysis per Dr. Erik Obey. He consulted Dr. Oletta Cohn at [facility] who recommended monitoring for spontaneous recovery with consideration for decompression of facial nerve and Ossiculoplasty later when stabilized. Patient started on high dose steroids for treatment as well as taping OD at nights--to follow up with ENT in a week and Opth consult recommended for diplopia. Therapy evaluations done and patient limited by back pain as well as dizziness with diplopia. MD and rehab team recommending CIR for progressive therapy and patient admitted today. Patient transferred to CIR on 12/09/2013 .     Patient currently requires min with basic self-care skills and functional mobility secondary to muscle weakness, decreased cardiorespiratoy endurance, decreased visual acuity and diplopia and decreased standing balance, decreased postural control and decreased balance strategies.  Prior to hospitalization, patient could complete BADLs and IADL independent .  Patient will benefit from skilled intervention to increase independence with basic self-care skills and increase level of independence with iADL prior to discharge home with care partner.  Anticipate patient will require no supervision as patient has Mod I goals and follow-up to be determined.  OT - End of Session Activity Tolerance: Decreased this session Endurance Deficit: Yes Endurance Deficit Description: fatigues quickly OT Assessment Rehab Potential (ACUTE ONLY): Excellent OT Patient demonstrates impairments in the following area(s): Balance;Safety;Vision;Motor;Endurance OT Basic ADL's  Functional Problem(s): Grooming;Bathing;Dressing;Toileting OT Advanced ADL's Functional Problem(s): Simple Meal Preparation  OT Transfers Functional Problem(s): Toilet;Tub/Shower OT Additional Impairment(s): Other (comment) (visual impairment) OT Plan OT Intensity: Minimum of 1-2 x/day, 45 to 90 minutes OT Frequency: 5 out of 7 days OT Duration/Estimated Length of Stay: 7-9 days OT Treatment/Interventions: Balance/vestibular training;Community reintegration;Discharge planning;DME/adaptive equipment instruction;Functional mobility training;Neuromuscular re-education;Patient/family education;Psychosocial support;Self Care/advanced ADL retraining;Therapeutic Activities;Therapeutic Exercise;UE/LE Strength taining/ROM;UE/LE Coordination activities;Visual/perceptual remediation/compensation OT Self Feeding Anticipated Outcome(s): n/a OT Basic Self-Care Anticipated Outcome(s): Mod I OT Toileting Anticipated Outcome(s): Mod I OT Bathroom Transfers Anticipated Outcome(s): Mod I OT Recommendation Recommendations for Other Services: Vestibular eval Patient destination: Home Follow Up Recommendations: None Equipment Recommended: None recommended by OT   Skilled Therapeutic Intervention Session 1: OT eval completed with wife present. Discussed role of OT, goals of therapy, therapy schedule, safety plan, DME, follow-up, and ELOS. Pt completed bathing at shower level via ambulating with min assist bed>bathroom using RW. Pt initiating use of compensatory strategies to feel for uneven surface with LEs d/t vestibular disturbances with head movements. Pt wearing eye patch throughout session and aware of alternating eyes every 2 hours. Pt fatigued quickly during therapy session, requiring multiple rest breaks. At end of session, pt left supine in bed with all needs in reach.  Session 2: Pt seen for 1:1 OT session with focus on functional mobility, transfers, and gaze stabilization exercises. Pt received supine in  bed. Completed VOR x1 gaze stabilization exercises in static sitting initially without eye patch. Pt tolerating 2-3 head turns at slow speed without eye patch before requiring rest break d/t nausea. Pt with increased difficulty keeping both eyes opened. Pt completed up to 10 head turns at moderate speed with eye patch. Ambulated to ADL apartment at close supervision level. Completed simulated walk-in shower transfer 2x with CGA when stepping over ledge. Ambulated back to room with SBA using RW and focus on head turns at slow pace. Pt reporting none-minimal nausea and dizziness during activity. Pt appears to have most difficulty with vertical movements. Pt left supine in bed with all needs in reach.   OT Evaluation Precautions/Restrictions  Precautions Precautions: Fall Precaution Comments: vertigo symptoms with increased head movement Restrictions Weight Bearing Restrictions: No General   Vital Signs Therapy Vitals Temp: 97.9 F (36.6 C) Temp Source: Oral Pulse Rate: 71 Resp: 19 BP: 138/87 mmHg Patient Position (if appropriate): Lying Oxygen Therapy SpO2: 96 % O2 Device: Not Delivered Pain Pain Assessment Pain Assessment: 0-10 Pain Score: 4  Pain Location: Back Pain Orientation: Lower Pain Descriptors / Indicators: Aching Pain Onset: Awakened from sleep Patients Stated Pain Goal: 2 Pain Intervention(s): Medication (See eMAR) Home Living/Prior Functioning Home Living Family/patient expects to be discharged to:: Private residence Living Arrangements: Spouse/significant other Available Help at Discharge: Family Type of Home: House Home Access: Stairs to enter Technical brewer of Steps: 3 to platform then 1 into house Entrance Stairs-Rails: None Home Layout: Two level, Bed/bath upstairs Alternate Level Stairs-Number of Steps: flight Alternate Level Stairs-Rails: Right  Lives With: Spouse Prior Function Level of Independence: Independent with basic ADLs, Independent  with homemaking with ambulation, Independent with gait, Independent with transfers Driving: Yes Vocation: Full time employment Leisure: Hobbies-yes (Comment) Comments: likes football and his dogs ADL   Vision/Perception  Vision- History Baseline Vision/History: Wears glasses Wears Glasses: At all times Patient Visual Report: Diplopia Vision- Assessment Vision Assessment?: Yes Eye Alignment: Impaired (comment) Ocular Range of Motion: Restricted on the right Alignment/Gaze Preference: Head turned Tracking/Visual Pursuits: Right eye does not track laterally;Decreased smoothness of eye movement to RIGHT superior field;Decreased smoothness  of eye movement to RIGHT inferior field;Decreased smoothness of horizontal tracking Saccades: Additional head turns occurred during testing Visual Fields: No apparent deficits Diplopia Assessment: Objects split side to side Additional Comments: Pt using eye patch, alternating every 2 hours  Cognition Overall Cognitive Status: Within Functional Limits for tasks assessed Arousal/Alertness: Awake/alert Orientation Level: Oriented X4 Attention: Selective Sustained Attention: Appears intact Selective Attention: Appears intact Memory: Impaired Memory Impairment: Retrieval deficit;Decreased recall of new information (delayed retrieval) Awareness: Appears intact Problem Solving: Appears intact Safety/Judgment: Appears intact [employer] Scales of Cognitive Functioning: Purposeful/appropriate Sensation Sensation Light Touch: Appears Intact Hot/Cold: Appears Intact Proprioception: Appears Intact Coordination Gross Motor Movements are Fluid and Coordinated: Yes Fine Motor Movements are Fluid and Coordinated: Yes Motor  Motor Motor: Within Functional Limits Mobility  Bed Mobility Bed Mobility: Supine to Sit;Sit to Supine Supine to Sit: 5: Supervision Supine to Sit Details: Verbal cues for technique Sit to Supine: 5: Supervision Sit to  Supine - Details: Verbal cues for technique Transfers Transfers: Sit to Stand;Stand to Sit Sit to Stand: 5: Supervision Sit to Stand Details: Verbal cues for technique Stand to Sit: 5: Supervision Stand to Sit Details (indicate cue type and reason): Verbal cues for technique Stand to Sit Details: verbal cues for hand placement  Trunk/Postural Assessment  Cervical Assessment Cervical Assessment: Exceptions to [facility] (limited by vertigo with head movements) Thoracic Assessment Thoracic Assessment: Within Functional Limits Lumbar Assessment Lumbar Assessment: Within Functional Limits Postural Control Postural Control: Deficits on evaluation Protective Responses: delayed  Balance Balance Balance Assessed: Yes Standardized Balance Assessment Standardized Balance Assessment: Berg Balance Test;Timed Up and Go Test Berg Balance Test Sit to Stand: Able to stand without using hands and stabilize independently Standing Unsupported: Able to stand 2 minutes with supervision Sitting with Back Unsupported but Feet Supported on Floor or Stool: Able to sit safely and securely 2 minutes Stand to Sit: Sits safely with minimal use of hands Transfers: Able to transfer with verbal cueing and /or supervision Standing Unsupported with Eyes Closed: Able to stand 10 seconds with supervision Standing Ubsupported with Feet Together: Able to place feet together independently and stand for 1 minute with supervision From Standing, Reach Forward with Outstretched Arm: Can reach confidently >25 cm (10") From Standing Position, Pick up Object from Floor: Able to pick up shoe, needs supervision From Standing Position, Turn to Look Behind Over each Shoulder: Looks behind from both sides and weight shifts well Turn 360 Degrees: Needs close supervision or verbal cueing Standing Unsupported, Alternately Place Feet on Step/Stool: Able to complete >2 steps/needs minimal assist Standing Unsupported, One Foot in Front: Able  to take small step independently and hold 30 seconds Standing on One Leg: Able to lift leg independently and hold 5-10 seconds Total Score: 41 Timed Up and Go Test TUG: Normal TUG Static Sitting Balance Static Sitting - Balance Support: Feet unsupported Static Sitting - Level of Assistance: 6: Modified independent (Device/Increase time) Dynamic Sitting Balance Dynamic Sitting - Balance Support: Feet supported Dynamic Sitting - Level of Assistance: 5: Stand by assistance Static Standing Balance Static Standing - Balance Support: During functional activity Static Standing - Level of Assistance: 4: Min assist Dynamic Standing Balance Dynamic Standing - Balance Support: During functional activity Dynamic Standing - Level of Assistance: 4: Min assist Extremity/Trunk Assessment RUE Assessment RUE Assessment: Within Functional Limits (5/5 strength) LUE Assessment LUE Assessment: Within Functional Limits (5/5 strength)  FIM:  FIM - Grooming Grooming Steps: Wash, rinse, dry face;Wash, rinse, dry hands Grooming:  3: Patient completes 2 of 4 or 3 of 5 steps FIM - Bathing Bathing Steps Patient Completed: Chest;Right Arm;Left Arm;Abdomen;Front perineal area;Left upper leg;Right upper leg;Left lower leg (including foot);Right lower leg (including foot);Buttocks Bathing: 4: Steadying assist FIM - Upper Body Dressing/Undressing Upper body dressing/undressing steps patient completed: Thread/unthread right sleeve of pullover shirt/dresss;Thread/unthread left sleeve of pullover shirt/dress;Put head through opening of pull over shirt/dress;Pull shirt over trunk Upper body dressing/undressing: 5: Set-up assist to: Obtain clothing/put away FIM - Lower Body Dressing/Undressing Lower body dressing/undressing steps patient completed: Thread/unthread right underwear leg;Thread/unthread left underwear leg;Pull underwear up/down;Thread/unthread right pants leg;Thread/unthread left pants leg;Pull pants  up/down;Don/Doff right sock;Don/Doff left sock;Don/Doff right shoe;Don/Doff left shoe Lower body dressing/undressing: 4: Steadying Assist FIM - Control and instrumentation engineer Devices: [facility] elevated;Walker Bed/Chair Transfer: 5: Supine > Sit: Supervision (verbal cues/safety issues);4: Bed > Chair or W/C: Min A (steadying Pt. > 75%);5: Sit > Supine: Supervision (verbal cues/safety issues);4: Chair or W/C > Bed: Min A (steadying Pt. > 75%) FIM - Radio producer Devices: Insurance account manager Transfers: 4-To toilet/BSC: Min A (steadying Pt. > 75%);4-From toilet/BSC: Min A (steadying Pt. > 75%) FIM - Tub/Shower Transfers Tub/Shower Assistive Devices: Walk in shower;Grab bars;Walker;Tub transfer bench Tub/shower Transfers: 4-Into Tub/Shower: Min A (steadying Pt. > 75%/lift 1 leg);4-Out of Tub/Shower: Min A (steadying Pt. > 75%/lift 1 leg)   Refer to Care Plan for Long Term Goals  Recommendations for other services: Other: Vestibular evaluation  Discharge Criteria: Patient will be discharged from OT if patient refuses treatment 3 consecutive times without medical reason, if treatment goals not met, if there is a change in medical status, if patient makes no progress towards goals or if patient is discharged from hospital.  The above assessment, treatment plan, treatment alternatives and goals were discussed and mutually agreed upon: by patient and by family  Duayne Cal 12/10/2013, 9:30 AM

## 2013-12-10 NOTE — Care Management Note (Signed)
[facility]patient [facility] Individual Statement of Services  Patient Name:  Michael GreeningJames [place]cheltree  Date:  12/10/2013  Welcome to the [facility]patient [facility].  Our goal is to provide you with an individualized program based on your diagnosis and situation, designed to meet your specific needs.  With this comprehensive rehabilitation program, you will be expected to participate in at least 3 hours of rehabilitation therapies Monday-Friday, with modified therapy programming on the weekends.  Your rehabilitation program will include the following services:  Physical Therapy (PT), Occupational Therapy (OT), Speech Therapy (ST), 24 hour per day rehabilitation nursing, Therapeutic Recreaction (TR), Neuropsychology, Case Management (Social Worker), Rehabilitation Medicine, Nutrition Services and Pharmacy Services  Weekly team conferences will be held on Tuesdays to discuss your progress.  Your Social Worker will talk with you frequently to get your input and to update you on team discussions.  Team conferences with you and your family in attendance may also be held.  Expected length of stay: 7-9 days  Overall anticipated outcome: modified independent  Depending on your progress and recovery, your program may change. Your Social Worker will coordinate services and will keep you informed of any changes. Your Social Worker's name and contact numbers are listed  below.  The following services may also be recommended but are not provided by the Inpatient [facility]:   Driving Evaluations  Home Health Rehabiltiation Services  Outpatient Rehabilitation Services  Vocational Rehabilitation   Arrangements will be made to provide these services after discharge if needed.  Arrangements include referral to agencies that provide these services.  Your insurance has been verified to be:  Worker's Comp Your primary doctor is:  (to be arranged)  Pertinent information will be shared with your  doctor and your insurance company.  Social Worker:  LintonLucy Lothar Prehn, [place] [phone] or ([phone]) [phone]   Information discussed with and copy given to patient by: Amada JupiterHOYLE, [place], 12/10/2013, 3:13 PM

## 2013-12-10 NOTE — IPOC Note (Signed)
Overall Plan of Care 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 Active Problems:   Skull base fx   Facial laceration   Lumbar transverse process fracture   Traumatic ossicular dislocation   TBI (traumatic brain injury)   Facial nerve palsy   Sixth cranial nerve disorder     Functional Problem List: Nursing Bowel, Endurance, Medication Management, Motor, Nutrition, Pain, Safety, Skin Integrity  PT Balance, Endurance, Motor, Pain, Safety, Sensory, Skin Integrity  OT Balance, Safety, Vision, Motor, Endurance  SLP Cognition, Linguistic, Nutrition  TR         Basic ADL's: OT Grooming, Bathing, Dressing, Toileting     Advanced  ADL's: OT Simple Meal Preparation     Transfers: PT Bed Mobility, Bed to Chair, Car, State Street CorporationFurniture, Civil Service fast [place]  OT Toilet, Research scientist (life sciences)Tub/Shower     Locomotion: PT Ambulation, Psychologist, prison and probation servicesWheelchair Mobility, Stairs     Additional Impairments: OT Other (comment) (visual impairment)  SLP Swallowing, Communication, Social Cognition expression Memory  TR      Anticipated Outcomes Item Anticipated Outcome  Self Feeding n/a  Swallowing  Mod I    Basic self-care  Mod I  Toileting  Mod I   Bathroom Transfers Mod I  Bowel/Bladder  Min assist  Transfers  mod I  Locomotion  mod I  Communication  Mod I   Cognition  Mod I   Pain  < 4  Safety/Judgment  Supervision   Therapy Plan: PT Intensity: Minimum of 1-2 x/day ,45 to 90 minutes PT Frequency: 5 out of 7 days PT Duration Estimated Length of Stay: 7-9 days OT Intensity: Minimum of 1-2 x/day, 45 to 90 minutes OT Frequency: 5 out of 7 days OT Duration/Estimated Length of Stay: 7-9 days SLP Intensity: Minumum of 1-2 x/day, 30 to 90 minutes SLP Frequency: 5 out of 7 days SLP Duration/Estimated Length of Stay: 7-9 days       Team  Interventions: Nursing Interventions Patient/Family Education, Bowel Management, Pain Management, Disease Management/Prevention, Medication Management, Skin Care/Wound Management, Cognitive Remediation/Compensation, Dysphagia/Aspiration Precaution Training  PT interventions Ambulation/gait training, Disease management/prevention, Pain management, Stair training, Visual/perceptual remediation/compensation, Warden/rangerBalance/vestibular training, DME/adaptive equipment instruction, Patient/family education, Therapeutic Activities, Wheelchair propulsion/positioning, Therapeutic Exercise, Psychosocial support, Cognitive remediation/compensation, Community reintegration, Functional mobility training, Skin care/wound management, UE/LE Strength taining/ROM, UE/LE Coordination activities, Discharge planning, Neuromuscular re-education  OT Interventions Warden/rangerBalance/vestibular training, Community reintegration, Discharge planning, DME/adaptive equipment instruction, Functional mobility training, Neuromuscular re-education, Patient/family education, Psychosocial support, Self Care/advanced ADL retraining, Therapeutic Activities, Therapeutic Exercise, UE/LE Strength taining/ROM, UE/LE Coordination activities, Visual/perceptual remediation/compensation  SLP Interventions Cognitive remediation/compensation, Cueing hierarchy, Environmental controls, Functional tasks, Internal/external aids, Medication managment, Oral motor exercises, Patient/family education, Therapeutic Activities  TR Interventions    SW/CM Interventions Discharge Planning, Psychosocial Support, Patient/Family Education    Team Discharge Planning: Destination: PT-Home ,OT- Home , SLP-Home Projected Follow-up: PT-Home health PT, Outpatient PT, 24 hour supervision/assistance (HHPT vs. OPPT TBD upon discharge; 24/7 supervision and assist TBD upon discharge), OT-  None, SLP-Other (comment) (TBD) Projected Equipment Needs: PT-To be determined, OT- None recommended by OT,  SLP-None recommended by SLP Equipment Details: PT-Patient does not own any DME; recommendations TBD upon discharge.l, OT-  Patient/family involved in discharge planning: PT- Patient, Family member/caregiver,  OT-Patient, SLP-Patient  MD ELOS: 7-9 days Medical Rehab Prognosis:  Excellent Assessment: The patient has been admitted for CIR therapies with the diagnosis of TBI  with polytrauma. The team will be addressing functional mobility, strength, stamina, balance, safety, adaptive techniques and equipment, self-care, bowel and bladder mgt, patient and caregiver education, visual perceptual abilities, cognition, NMR, activity tolerance, community reintegration, BI education. Goals have been set at mod I for most mobility, selfcare, and functional tasks.    Michael OysterZachary T. Decie Verne, MD, FAAPMR      See Team Conference Notes for weekly updates to the plan of care

## 2013-12-10 NOTE — Progress Notes (Signed)
[facility] PHYSICAL MEDICINE & REHABILITATION     PROGRESS NOTE    Subjective/Complaints: Complains of some constipation. Low back sore. Able to sleep last night. Ready to start therapies today. Has reasonable appetite.  Objective: Vital Signs: Blood pressure 138/87, pulse 71, temperature 97.9 F (36.6 C), temperature source Oral, resp. rate 19, SpO2 96 %. No results found.  Recent Labs  12/10/13 0552  WBC 13.2*  HGB 13.4  HCT 40.1  PLT 210    Recent Labs  12/10/13 0552  NA 138  K 3.6*  CL 97  GLUCOSE 97  BUN 18  CREATININE 0.83  CALCIUM 8.7   CBG (last 3)   Recent Labs  12/09/13 1752 12/09/13 2123 12/10/13 0703  GLUCAP 125* 132* 94    Wt Readings from Last 3 Encounters:  12/06/13 114.7 kg (252 lb 13.9 oz)    Physical Exam:  HENT: oral mucosa pink and moist Head: Normocephalic.  Right ear with surgical dressing. Sutures intact Eyes: Pupils are equal, round, and reactive to light. Right conjunctiva is injected somewhat, large amounts of ointment in and around eye Laceration on left lid with sutures in place.  Neck: Normal range of motion. Neck supple.  Respiratory: Effort normal and breath sounds normal.  GI: Soft. Bowel sounds are normal. He exhibits no distension. There is no tenderness.  Musculoskeletal: He exhibits no edema or tenderness.  Low back pain with SLR on right.  Neurological: He is alert and oriented to person, place, and time.  Speech clear. HOH. Unable to move right eye laterally with complaints of diplopia in right lateral field. Right facial paresis. Cannot close right eye lid completely.  Follows commands without difficulty. fair insight and awareness. UE: 4+/5 prox to distal. LE's 4 to 4+/5 prox to distal. No sensory findings. dtr's 1+ Skin: Skin is warm and dry.  Large ecchymotic area on mid lower back.     Assessment/Plan: 1. Functional deficits secondary to TBI with temporal skull fracture/polytrauma which require 3+  hours per day of interdisciplinary therapy in a comprehensive inpatient rehab setting. Physiatrist is providing close team supervision and 24 hour management of active medical problems listed below. Physiatrist and rehab team continue to assess barriers to discharge/monitor patient progress toward functional and medical goals. FIM:                                 Medical Problem List and Plan: 1. 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 DVT Prophylaxis/Anticoagulation: Mechanical: Sequential compression devices, below knee Bilateral lower extremities 3. Pain Management: Continue oxycodone prn.  4. Mood: LCSW to follow for evaluation and support.  5. Neuropsych: This patient is capable of making decisions on his own behalf. 6. Skin/Wound Care: Routine pressure relief measures. Monitor wound for healing.  7. Fluids/Electrolytes/Nutrition: Monitor I/O. Advance to regular diet today 8. Right ossicular disruption: To continue high dose steriods for a week followed by slow taper over 3 weeks per Dr. Lazarus SalinesWolicki,   -hearing impaired but able to communicate with clear, loud speech 9. Leucocytosis: Monitor for signs of infection or fevers. 13.2 today  10. Steroid induced Hyperglycemia: dc cbg checks  11. Basilar skull fracture: ordered follow up CCT on Monday 11/16 per NS recommendations.  12. Right CN VII,VI palsy: Eye drops thorough out the day with patching at bedtime. Patient educated on need for patching/moisture to prevent scleraeal abrasion.  -might  do well with swim goggle to cover eye and keep a moist environment  13. Headaches: topamax added to help with symptoms.  14. Back pain: consider xrays of lumbar spine if pain worsens  -see no signs of myelopathy on exam this am LOS (Days) 1 A FACE TO FACE EVALUATION WAS PERFORMED  Michael Hooper T 12/10/2013 8:22 AM

## 2013-12-10 NOTE — Progress Notes (Signed)
Physical Therapy Session Note  Patient Details  Name: Michael GreeningJames Cihlar MRN: [id number] Date of Birth: May 10, 1960  Today's Date: 12/10/2013 PT Individual Time: 1530-1630 PT Individual Time Calculation (min): 60 min   Short Term Goals: Week 1:  PT Short Term Goal 1 (Week 1): STGs=LTGs secondary to ELOS and CLOF  Skilled Therapeutic Interventions/Progress Updates:    Patient received semi-reclined in bed. Session focused on functional mobility, dynamic standing balance, high level ambulation, and activity tolerance/strengthening. See details below for high level ambulation. Patient performed gait training >150' x1, 140' x1, and several small bouts of 20-100' without AD and min guard; verbal cues for obstacle negotiation on R due to eye patch and decreased head turns due to induced dizziness.   Standing horseshoe toss with reaching for horseshoes at different heights outside of BOS, second round progressed to standing on foam; minguard-minA overall. Initiated [place] exercises standing at high table for B UE support: heel raises, hip abd, knee flex x20 each LE with supervision. Repeated sit<>stands without UEs and supervision 2x10. NuStep Level 4 with B UE/LE x10' to increase activity tolerance and decrease tightness in low back musculature. Patient returned to room and left semi-reclined in bed with all needs within reach.  Therapy Documentation Precautions:  Precautions Precautions: Fall Precaution Comments: vertigo symptoms with increased head movement Restrictions Weight Bearing Restrictions: No Pain: Pain Assessment Pain Assessment: 0-10 Pain Score: 5  Pain Type: Acute pain Pain Location: Back Pain Orientation: Lower Pain Descriptors / Indicators: Aching;Sore Pain Onset: On-going Pain Intervention(s): RN made aware;Repositioned;Ambulation/increased activity Multiple Pain Sites: No Locomotion : High Level Ambulation High Level Ambulation: Side stepping;Backwards walking Side  Stepping: x30' in each direction with min guard, no overt LOB Backwards Walking: x60' with min guard, no overt LOB  See FIM for current functional status  Therapy/Group: Individual Therapy  Chipper HerbBridget S Benett Swoyer S. Anikah Hogge, PT, DPT 12/10/2013, 4:20 PM

## 2013-12-10 NOTE — Progress Notes (Signed)
Patient information reviewed and entered into [facility] system by Cem Kosman, RN, CRRN, PPS Coordinator.  Information including medical coding and functional independence measure will be reviewed and updated through discharge.    

## 2013-12-10 NOTE — Plan of Care (Signed)
Problem: RH BOWEL ELIMINATION Goal: RH STG MANAGE BOWEL WITH ASSISTANCE STG Manage Bowel with min Assistance.  Outcome: Not Progressing Required Sorbitol to have a bowel movement  Goal: RH STG MANAGE BOWEL W/MEDICATION W/ASSISTANCE STG Manage Bowel with Medication with min Assistance.  Outcome: Not Progressing Required Sorbitol to have a bowel movement   Problem: RH SKIN INTEGRITY Goal: RH STG ABLE TO PERFORM INCISION/WOUND CARE W/ASSISTANCE STG Able To Perform Incision/Wound Care With Total Assistance from caregiver.  Outcome: Progressing  Problem: RH SAFETY Goal: RH STG ADHERE TO SAFETY PRECAUTIONS W/ASSISTANCE/DEVICE STG Adhere to Safety Precautions With supervision Assistance/Device.  Outcome: Progressing Goal: RH STG DECREASED RISK OF FALL WITH ASSISTANCE STG Decreased Risk of Fall With supervision Assistance.  Outcome: Progressing  Problem: RH PAIN MANAGEMENT Goal: RH STG PAIN MANAGED AT OR BELOW PT'S PAIN GOAL < 4  Outcome: Not Progressing Patient scored pain higher than 4/10

## 2013-12-11 ENCOUNTER — Inpatient Hospital Stay (HOSPITAL_COMMUNITY): Payer: Worker's Compensation

## 2013-12-11 ENCOUNTER — Inpatient Hospital Stay (HOSPITAL_COMMUNITY): Payer: Worker's Compensation | Admitting: Occupational Therapy

## 2013-12-11 ENCOUNTER — Inpatient Hospital Stay (HOSPITAL_COMMUNITY): Payer: Worker's Compensation | Admitting: *Deleted

## 2013-12-11 DIAGNOSIS — S0219XD Other fracture of base of skull, subsequent encounter for fracture with routine healing: Secondary | ICD-10-CM

## 2013-12-11 DIAGNOSIS — S32008D Other fracture of unspecified lumbar vertebra, subsequent encounter for fracture with routine healing: Secondary | ICD-10-CM

## 2013-12-11 DIAGNOSIS — S069X4S Unspecified intracranial injury with loss of consciousness of 6 hours to 24 hours, sequela: Secondary | ICD-10-CM

## 2013-12-11 MED ORDER — ALPRAZOLAM 0.5 MG PO TABS
0.5000 mg | ORAL_TABLET | Freq: Three times a day (TID) | ORAL | Status: DC | PRN
  Administered 2013-12-11 – 2013-12-15 (×9): 0.5 mg via ORAL
  Filled 2013-12-11 (×10): qty 1

## 2013-12-11 NOTE — Plan of Care (Signed)
Problem: RH PAIN MANAGEMENT Goal: RH STG PAIN MANAGED AT OR BELOW PT'S PAIN GOAL <4  Outcome: Progressing     

## 2013-12-11 NOTE — Plan of Care (Signed)
Problem: RH SKIN INTEGRITY Goal: RH STG ABLE TO PERFORM INCISION/WOUND CARE W/ASSISTANCE STG Able To Perform Incision/Wound Care With Total Assistance from caregiver.  Outcome: Progressing

## 2013-12-11 NOTE — Progress Notes (Signed)
Physical Therapy Session Note  Patient Details  Name: Michael Hooper MRN: [id number] Date of Birth: 1960/10/31  Today's Date: 12/11/2013 PT Individual Time: 1000-1100 PT Individual Time Calculation (min): 60 min   Short Term Goals: Week 1:  PT Short Term Goal 1 (Week 1): STGs=LTGs secondary to ELOS and CLOF  Skilled Therapeutic Interventions/Progress Updates:    Pt received sitting in chair in room, with family present. Pt agreeable to therapy. Pt ambulated in hallway 150 feet to therapy gym with use of RW at supervision level. Pt able to complete sit to stand/stand to sit transfers at supervision level. Pt reports minimal dizziness today that is intermittent. Pt ambulated in hallways without  AD at close supervision level, demonstrating x2 LOB to the right but pt able to self correct. Pt ambulated 150 feet x1 and 220 feet x1 without use of AD. Pt also completed higher level balance and proprioception exercises, including toe taps onto a step, 1x10 right and left and 1x10 alternating lower extremities. Pt participating in ball toss, catching and throwing with his son participating, catching the ball and also bouncing the ball, 2x30 each. Pt performed standing exercises to promote increase strength of BLEs, including heel raises, toe raises, hip abd, marching, mini squats and hamstring curls, 2x10. Pt completed the session by performing endurance training on the nu-step x10 min on level 3. Pt reported minimal back pain today with activities, after reports "I think I slept wrong last night." Pt ambulated without an AD back to his room and requested to lay down in supine. Pt's family present in room and all needs in place.   Therapy Documentation Precautions:  Precautions Precautions: Fall Precaution Comments: vertigo symptoms with increased head movement Restrictions Weight Bearing Restrictions: No Pain: Pain Assessment Pain Assessment: 0-10 Pain Score: 2  Pain Type: Acute pain Pain Location:  Back Pain Orientation: Lower Pain Descriptors / Indicators: Aching;Sore Pain Onset: On-going Pain Intervention(s): Repositioned;Emotional support   Locomotion : Ambulation Ambulation/Gait Assistance: 5: Supervision      See FIM for current functional status  Therapy/Group: Individual Therapy  Allessandra Bernardi R 12/11/2013, 10:53 AM

## 2013-12-11 NOTE — Progress Notes (Addendum)
Occupational Therapy Session Note  Patient Details  Name: Michael GreeningJames Cushman MRN: [id number] Date of Birth: 1960/03/08  Today's Date: 12/11/2013 OT Individual Time: [id number] OT Individual Time Calculation (min): 60 min    Short Term Goals: Week 1:  OT Short Term Goal 1 (Week 1): STGs=LTGs :   Patient will reach modified Independ. level with all self care before discharge. Patient will complete gaze stabilization exercises independently with handout to increase safety with functional mobility and self-care tasks before discharge.    Skilled Therapeutic Interventions/Progress Updates:  Patient received skilled OT to address deficits resulting from accident at work in order to return to as close of prior function as possible by addressing the following:   mild dizziness with lateral eye movements both when 1) while sitting and standing dynamic and static in shower without eye patch on and 2) when he tried to listen to and speak with RN and therapist on opposite sides of the room (wear patch) and stabilized when he focused on person/item in his frontal view.   No losses of balance either incident and none when standing at sink to brush teeth.                     Due to resulting facial, neck and throat muscular deficits following the accident, He had diffculty "spitting" out toothpaste and water to rinse his mouth afterwards but was able to do with towel over shirt to catch any that drained to his clothing.  Patient very polite and positive during his therapy sessions.                 Patient hard of hearing and requested this clinician and nurse speak in his right hear. ---------------------------------------------  This clinician went back to patient room to touch base in the afternoon regarding patient visual/vestibular comfort.  He voiced no complaints.   Wife voiced questions such as whether patient should have wear TED hose and have certain back movement precautions due to his lower back  fracture and complaints of burning in low back and into legs with certain movements and activities.   She stated she will come in tomorrow at 8 to speak with doctor during morning rounds.   This clinician did encourage wife to speak with rehab doctor regarding the questions.     Therapy Documentation Precautions:  Precautions Precautions: Fall Precaution Comments: vertigo symptoms with increased head movement Restrictions Weight Bearing Restrictions: No   Pain:5./10 Low back constant.  RN gave pain med    See FIM for current functional status  Therapy/Group: Individual Therapy  Bud Faceickett, Gabriel Con[facility] 12/11/2013, 8:30 AM

## 2013-12-11 NOTE — Plan of Care (Signed)
Problem: RH SAFETY Goal: RH STG ADHERE TO SAFETY PRECAUTIONS W/ASSISTANCE/DEVICE STG Adhere to Safety Precautions With supervision Assistance/Device.  Outcome: Progressing     

## 2013-12-11 NOTE — Progress Notes (Signed)
[facility] PHYSICAL MEDICINE & REHABILITATION     PROGRESS NOTE    Subjective/Complaints: Was tired in therapy yest but said "feels good to be up" . Low back sore. Able to sleep last night. Ready to start therapies today. Has reasonable appetite.  Objective: Vital Signs: Blood pressure 170/92, pulse 71, temperature 97.7 F (36.5 C), temperature source Oral, resp. rate 18, SpO2 96 %. No results found.  Recent Labs  12/10/13 0552  WBC 13.2*  HGB 13.4  HCT 40.1  PLT 210    Recent Labs  12/10/13 0552  NA 138  K 3.6*  CL 97  GLUCOSE 97  BUN 18  CREATININE 0.83  CALCIUM 8.7   CBG (last 3)   Recent Labs  12/09/13 1752 12/09/13 2123 12/10/13 0703  GLUCAP 125* 132* 94    Wt Readings from Last 3 Encounters:  12/06/13 114.7 kg (252 lb 13.9 oz)    Physical Exam:  HENT: oral mucosa pink and moist Head: Normocephalic.  Right ear with surgical dressing. Sutures intact Eyes: Pupils are equal, round, and reactive to light. Right conjunctiva is injected somewhat, large amounts of ointment in and around eye Laceration on left lid with sutures in place.  Neck: Normal range of motion. Neck supple.  Respiratory: Effort normal and breath sounds normal.  GI: Soft. Bowel sounds are normal. He exhibits no distension. There is no tenderness.  Musculoskeletal: He exhibits no edema or tenderness.  Low back pain with SLR on right.  Neurological: He is alert and oriented to person, place, and time.  Speech clear. HOH. Unable to move right eye laterally with complaints of diplopia in right lateral field. Right facial paresis. Cannot close right eye lid completely.  Follows commands without difficulty. fair insight and awareness. UE: 4+/5 prox to distal. LE's 4 to 4+/5 prox to distal. No sensory findings. dtr's 1+ Skin: Skin is warm and dry.  Large ecchymotic area on mid lower back.     Assessment/Plan: 1. Functional deficits secondary to TBI with temporal skull  fracture/polytrauma which require 3+ hours per day of interdisciplinary therapy in a comprehensive inpatient rehab setting. Physiatrist is providing close team supervision and 24 hour management of active medical problems listed below. Physiatrist and rehab team continue to assess barriers to discharge/monitor patient progress toward functional and medical goals. FIM: FIM - Bathing Bathing Steps Patient Completed: Chest, Left lower leg (including foot), Right Arm, Left Arm, Abdomen, Front perineal area, Buttocks, Right upper leg, Left upper leg, Right lower leg (including foot) Bathing: 5: Supervision: Safety issues/verbal cues  FIM - Upper Body Dressing/Undressing Upper body dressing/undressing steps patient completed: Thread/unthread right sleeve of pullover shirt/dresss, Pull shirt over trunk, Thread/unthread left sleeve of pullover shirt/dress, Put head through opening of pull over shirt/dress Upper body dressing/undressing: 5: Set-up assist to: Obtain clothing/put away FIM - Lower Body Dressing/Undressing Lower body dressing/undressing steps patient completed: Thread/unthread right underwear leg, Fasten/unfasten pants, Thread/unthread left underwear leg, Don/Doff right sock, Pull underwear up/down, Don/Doff left sock, Thread/unthread right pants leg, Thread/unthread left pants leg, Pull pants up/down Lower body dressing/undressing: 5: Supervision: Safety issues/verbal cues  FIM - Toileting Toileting: 0: Activity did not occur  FIM - Diplomatic Services operational officerToilet Transfers Toilet Transfers Assistive Devices: Art gallery managerWalker Toilet Transfers: 0-Activity did not occur  FIM - BankerBed/Chair Transfer Bed/Chair Transfer Assistive Devices: Therapist, occupationalWalker Bed/Chair Transfer: 7: Supine > Sit: No assist, 5: Bed > Chair or W/C: Supervision (verbal cues/safety issues)  FIM - Locomotion: Wheelchair Distance: 150 Locomotion: Wheelchair: 0: Activity did not  occur FIM - Locomotion: Ambulation Locomotion: Ambulation Assistive Devices: Other  (comment) (none) Ambulation/Gait Assistance: 4: Min guard Locomotion: Ambulation: 2: Travels 50 - 149 ft with minimal assistance (Pt.>75%)  Comprehension Comprehension Mode: Visual Comprehension: 4-Understands basic 75 - 89% of the time/requires cueing 10 - 24% of the time  Expression Expression Mode: Verbal Expression: 4-Expresses basic 75 - 89% of the time/requires cueing 10 - 24% of the time. Needs helper to occlude trach/needs to repeat words.  Social Interaction Social Interaction: 6-Interacts appropriately with others with medication or extra time (anti-anxiety, antidepressant).  Problem Solving Problem Solving: 6-Solves complex problems: With extra time  Memory Memory: 7-Complete Independence: No helper Medical Problem List and Plan: 1. 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 DVT Prophylaxis/Anticoagulation: Mechanical: Sequential compression devices, below knee Bilateral lower extremities 3. Pain Management: Continue oxycodone prn.  4. Mood: LCSW to follow for evaluation and support.  5. Neuropsych: This patient is capable of making decisions on his own behalf. 6. Skin/Wound Care: Routine pressure relief measures. Monitor wound for healing.  7. Fluids/Electrolytes/Nutrition: Monitor I/O. Advance to regular diet today 8. Right ossicular disruption: To continue high dose steriods for a week followed by slow taper over 3 weeks per Dr. Lazarus SalinesWolicki,   -hearing impaired but able to communicate with clear, loud speech 9. Leucocytosis: Monitor for signs of infection or fevers. 13.2 today  10. Steroid induced Hyperglycemia: dc cbg checks  11. Basilar skull fracture: ordered follow up CCT on Monday 11/16 per NS recommendations.  12. Right CN VII,VI palsy: Eye drops thorough out the day with patching at bedtime. Patient educated on need for patching/moisture to prevent scleraeal abrasion.  -might do well with swim  goggle to cover eye and keep a moist environment  13. Headaches: topamax added to help with symptoms.  14. Back pain: consider xrays of lumbar spine if pain worsens  -see no signs of myelopathy on exam this am 15. Elevated BP this am monitor , no hx HTN LOS (Days) 2 A FACE TO FACE EVALUATION WAS PERFORMED  Beya Tipps E 12/11/2013 8:30 AM

## 2013-12-11 NOTE — Progress Notes (Signed)
Occupational Therapy Session Note  Patient Details  Name: Annell GreeningJames Loss MRN: [id number] Date of Birth: 10-May-1960  Today's Date: 12/11/2013 OT Individual Time: 1430-1530 OT Individual Time Calculation (min): 60 min    Short Term Goals: Week 1:  OT Short Term Goal 1 (Week 1): STGs=LTGs :     Skilled Therapeutic Interventions/Progress Updates:    Note:  Left ear hears better than right ear.  Pt. Ambulated from room to gym.  Addressed dynamic balance for ADL's.  Performed limits of stability, postural control, quadraped balance, lunges, creeping, biodex,occuomotor exercises.  Addressed visual scanning with head turns while walking.  Worked on convergence without patch.  Pt. Reported he had single vision to the distant right.  Pt. Had one episode of dizziness during session.  Ambulated back to room and left with all needs and wife present.  Provided recliner for wife to sleep.    Therapy Documentation Precautions:  Precautions Precautions: Fall Precaution Comments: vertigo symptoms with increased head movement Restrictions Weight Bearing Restrictions: No     Pain:  none          See FIM for current functional status  Therapy/Group: Individual Therapy  Humberto Sealsdwards, Karel Mowers J 12/11/2013, 6:39 PM

## 2013-12-12 ENCOUNTER — Inpatient Hospital Stay (HOSPITAL_COMMUNITY): Payer: Worker's Compensation | Admitting: Occupational Therapy

## 2013-12-12 DIAGNOSIS — S0181XD Laceration without foreign body of other part of head, subsequent encounter: Secondary | ICD-10-CM

## 2013-12-12 LAB — GLUCOSE, CAPILLARY
GLUCOSE-CAPILLARY: 151 mg/dL — AB (ref 70–99)
Glucose-Capillary: 125 mg/dL — ABNORMAL HIGH (ref 70–99)

## 2013-12-12 MED ORDER — ACETAMINOPHEN 325 MG PO TABS
650.0000 mg | ORAL_TABLET | Freq: Four times a day (QID) | ORAL | Status: DC | PRN
  Administered 2013-12-12 – 2013-12-16 (×6): 650 mg via ORAL
  Filled 2013-12-12 (×6): qty 2

## 2013-12-12 MED ORDER — OXYCODONE HCL ER 10 MG PO T12A
10.0000 mg | EXTENDED_RELEASE_TABLET | Freq: Every day | ORAL | Status: DC
  Administered 2013-12-12 – 2013-12-15 (×4): 10 mg via ORAL
  Filled 2013-12-12 (×4): qty 1

## 2013-12-12 NOTE — Plan of Care (Signed)
Problem: RH BOWEL ELIMINATION Goal: RH STG MANAGE BOWEL WITH ASSISTANCE STG Manage Bowel with min Assistance.  Outcome: Progressing Goal: RH STG MANAGE BOWEL W/MEDICATION W/ASSISTANCE STG Manage Bowel with Medication with min Assistance.  Outcome: Progressing  Problem: RH SKIN INTEGRITY Goal: RH STG ABLE TO PERFORM INCISION/WOUND CARE W/ASSISTANCE STG Able To Perform Incision/Wound Care With Total Assistance from caregiver.  Outcome: Progressing  Problem: RH SAFETY Goal: RH STG ADHERE TO SAFETY PRECAUTIONS W/ASSISTANCE/DEVICE STG Adhere to Safety Precautions With supervision Assistance/Device.  Outcome: Progressing Goal: RH STG DECREASED RISK OF FALL WITH ASSISTANCE STG Decreased Risk of Fall With supervision Assistance.  Outcome: Progressing  Problem: RH PAIN MANAGEMENT Goal: RH STG PAIN MANAGED AT OR BELOW PT'S PAIN GOAL < 4  Outcome: Not Progressing Patient has reported pain at a level higher than 4

## 2013-12-12 NOTE — Progress Notes (Signed)
Michael Hooper PHYSICAL MEDICINE & REHABILITATION     PROGRESS NOTE    Subjective/Complaints: participating well with therapy Multiple questions regarding sinus congestion, back pain , dizziness, BP, Blood sugars Sinus congestion. Notes good relief with tylenol, thinks he needs stronger med mainly at noc Occ "numbness" in feet only in the morming  Post prandial CBG 125 this am  Review of Systems - Negative except as above Objective: Vital Signs: Blood pressure 149/95, pulse 67, temperature 97.5 F (36.4 C), temperature source Oral, resp. rate 17, SpO2 98 %. No results found.  Recent Labs  12/10/13 0552  WBC 13.2*  HGB 13.4  HCT 40.1  PLT 210    Recent Labs  12/10/13 0552  NA 138  K 3.6*  CL 97  GLUCOSE 97  BUN 18  CREATININE 0.83  CALCIUM 8.7   CBG (last 3)   Recent Labs  12/09/13 1752 12/09/13 2123 12/10/13 0703  GLUCAP 125* 132* 94    Wt Readings from Last 3 Encounters:  12/06/13 114.7 kg (252 lb 13.9 oz)    Physical Exam:  HENT: oral mucosa pink and moist Head: Normocephalic.  Right ear with surgical dressing. Sutures intact Eyes: Pupils are equal, round, and reactive to light. Right conjunctiva is injected somewhat, large amounts of ointment in and around eye Laceration on left lid with sutures in place.  Neck: Normal range of motion. Neck supple.  Respiratory: Effort normal and breath sounds normal.  GI: Soft. Bowel sounds are normal. He exhibits no distension. There is no tenderness.  Musculoskeletal: He exhibits no edema or tenderness.  Low back pain with SLR on right.  Neurological: He is alert and oriented to person, place, and time.  Speech clear. HOH. Unable to move right eye laterally with complaints of diplopia in right lateral field. Right facial paresis. Cannot close right eye lid completely.  Follows commands without difficulty. fair insight and awareness. UE: 4+/5 prox to distal. LE's 4 to 4+/5 prox to distal. No sensory  findings. dtr's 1+ Skin: Skin is warm and dry.  Large ecchymotic area on mid lower back.     Assessment/Plan: 1. Functional deficits secondary to TBI with temporal skull fracture/polytrauma which require 3+ hours per day of interdisciplinary therapy in a comprehensive inpatient rehab setting. Physiatrist is providing close team supervision and 24 hour management of active medical problems listed below. Physiatrist and rehab team continue to assess barriers to discharge/monitor patient progress toward functional and medical goals. Discussed time course of recovery, discussed CT Brain, and Lumbar findings, pain management.  Answered Pt and wife questions Will hold off on MRI lumbar untess lower ext numbness become more freq or severe Repeat CT head in am NS to review FIM: FIM - Bathing Bathing Steps Patient Completed: Chest, Right Arm, Left Arm, Abdomen, Front perineal area, Buttocks Bathing: 5: Supervision: Safety issues/verbal cues  FIM - Upper Body Dressing/Undressing Upper body dressing/undressing steps patient completed: Thread/unthread right sleeve of pullover shirt/dresss, Pull shirt over trunk, Thread/unthread left sleeve of pullover shirt/dress, Put head through opening of pull over shirt/dress Upper body dressing/undressing: 5: Supervision: Safety issues/verbal cues FIM - Lower Body Dressing/Undressing Lower body dressing/undressing steps patient completed: Thread/unthread right underwear leg, Fasten/unfasten pants, Thread/unthread left underwear leg, Pull underwear up/down, Thread/unthread right pants leg, Thread/unthread left pants leg, Pull pants up/down Lower body dressing/undressing: 5: Supervision: Safety issues/verbal cues  FIM - Toileting Toileting: 0: Activity did not occur  FIM - Diplomatic Services operational officerToilet Transfers Toilet Transfers Assistive Devices: Art gallery managerWalker Toilet Transfers: 0-Activity did  not occur  FIM - BankerBed/Chair Transfer Bed/Chair Transfer Assistive Devices: Diplomatic Services operational officerWalker Bed/Chair  Transfer: 5: Bed > Chair or W/C: Supervision (verbal cues/safety issues)  FIM - Locomotion: Wheelchair Distance: 150 Locomotion: Wheelchair: 0: Activity did not occur FIM - Locomotion: Ambulation Locomotion: Ambulation Assistive Devices:  (none) Ambulation/Gait Assistance: 5: Supervision Locomotion: Ambulation: 5: Travels 150 ft or more with supervision/safety issues  Comprehension Comprehension Mode: Visual Comprehension: 4-Understands basic 75 - 89% of the time/requires cueing 10 - 24% of the time  Expression Expression Mode: Verbal Expression: 5-Expresses complex 90% of the time/cues < 10% of the time  Social Interaction Social Interaction: 6-Interacts appropriately with others with medication or extra time (anti-anxiety, antidepressant).  Problem Solving Problem Solving: 5-Solves complex 90% of the time/cues < 10% of the time  Memory Memory: 6-More than reasonable amt of time Medical Problem List and Plan: 1. 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 DVT Prophylaxis/Anticoagulation: Mechanical: Sequential compression devices, below knee Bilateral lower extremities 3. Pain Management: Continue oxycodone prn.  4. Mood: LCSW to follow for evaluation and support.  5. Neuropsych: This patient is capable of making decisions on his own behalf. 6. Skin/Wound Care: Routine pressure relief measures. Monitor wound for healing.  7. Fluids/Electrolytes/Nutrition: Monitor I/O. Advance to regular diet today 8. Right ossicular disruption: To continue high dose steriods for a week followed by slow taper over 3 weeks per Dr. Lazarus SalinesWolicki,   -hearing impaired but able to communicate with clear, loud speech 9. Leucocytosis: Monitor for signs of infection or fevers. 13.2 , on high dose steroids expect elevation  10. Steroid induced Hyperglycemia: dc cbg checks  11. Basilar skull fracture: ordered follow up CCT on Monday 11/16 per  NS recommendations.  12. Right CN VII,VI palsy: Eye drops thorough out the day with patching at bedtime. Patient educated on need for patching/moisture to prevent scleraeal abrasion.  -might do well with swim goggle to cover eye and keep a moist environment  13. Headaches: topamax added to help with symptoms.  14. Back pain: consider additional imaging of lumbar spine if pain worsens  -see no signs of myelopathy on exam this am 15. Elevated BP this am monitor , no hx HTN LOS (Days) 3 A FACE TO FACE EVALUATION WAS PERFORMED  Jaegar Croft E 12/12/2013 8:21 AM

## 2013-12-13 ENCOUNTER — Inpatient Hospital Stay (HOSPITAL_COMMUNITY): Payer: Worker's Compensation | Admitting: Speech Pathology

## 2013-12-13 ENCOUNTER — Encounter (HOSPITAL_COMMUNITY): Payer: Self-pay

## 2013-12-13 ENCOUNTER — Inpatient Hospital Stay (HOSPITAL_COMMUNITY): Payer: Worker's Compensation

## 2013-12-13 ENCOUNTER — Ambulatory Visit (HOSPITAL_COMMUNITY): Payer: Self-pay | Admitting: Physical Therapy

## 2013-12-13 DIAGNOSIS — S0181XS Laceration without foreign body of other part of head, sequela: Secondary | ICD-10-CM

## 2013-12-13 DIAGNOSIS — S069X2S Unspecified intracranial injury with loss of consciousness of 31 minutes to 59 minutes, sequela: Secondary | ICD-10-CM

## 2013-12-13 LAB — GLUCOSE, CAPILLARY
Glucose-Capillary: 121 mg/dL — ABNORMAL HIGH (ref 70–99)
Glucose-Capillary: 122 mg/dL — ABNORMAL HIGH (ref 70–99)
Glucose-Capillary: 149 mg/dL — ABNORMAL HIGH (ref 70–99)

## 2013-12-13 NOTE — Progress Notes (Signed)
Recreational Therapy Session Note  Patient Details  Name: Michael Hooper MRN: [id number] Date of Birth: 1960/02/04 Today's Date: 12/13/2013  Skilled Therapeutic Interventions/Progress Updates: Met with pt during co-treat with OT & pt unable to state leisure interests when questioned.  Wife present and responded "he didn't do anything".  Potential focus on leisure education including identifying interests & researching community resources as well as community reintegration.  Will follow up with pt tomorrow and complete eval if agreeable at that time. [place] 12/13/2013, 3:27 PM

## 2013-12-13 NOTE — Discharge Summary (Addendum)
Physician Discharge Summary  Patient ID: Michael Hooper MRN: [id number] DOB/AGE: 1961-01-19 53 y.o.  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   Ct Head Wo Contrast  12/13/2013   CLINICAL DATA:  Followup basilar skull fracture. Right hearing loss.  EXAM: CT HEAD WITHOUT CONTRAST  TECHNIQUE: Contiguous axial images were obtained from the base of the skull through the vertex without intravenous contrast.  COMPARISON:  12/07/2013  FINDINGS: Again the brain itself appears normal without evidence of atrophy, old or acute infarction, mass lesion, hemorrhage, hydrocephalus or extra-axial collection. One could question mild edema in the right temporal lobe, but this is not definite. Temporal bone fracture on the right is known present from previous detail temporal bone examinations, with disruption of the ossicular chain. Fluid persists throughout both mastoid regions, right more than left.  IMPRESSION: No definite intracranial abnormality. Question mild edema in the right temporal lobe, not definite. No hemorrhage or extra-axial blood.  Fluid persisting within the mastoid air cells, more extensive on the right than the left, with evidence of ossicular chain disruption as better shown on previous dedicated temporal bone CT studies.   Electronically Signed   By: Paulina Fusi M.D.   On: 12/13/2013 07:52    Labs:  Basic Metabolic Panel:  Recent Labs Lab 12/10/13 0552  NA 138  K 3.6*  CL 97  CO2 30  GLUCOSE 97  BUN 18  CREATININE 0.83  CALCIUM 8.7    CBC: CBC Latest Ref Rng 12/10/2013 12/07/2013 12/06/2013  WBC 4.0 - 10.5 K/uL 13.2(H)  13.6(H) 15.7(H)  Hemoglobin 13.0 - 17.0 g/dL 40.9 12.9(L) 14.0  Hematocrit 39.0 - 52.0 % 40.1 39.5 42.0  Platelets 150 - 400 K/uL 210 217 236     CBG:  Recent Labs Lab 12/13/13 0919 12/13/13 1841 12/14/13 1907 12/15/13 0638 12/15/13 1129  GLUCAP 122* 149* 152* 95 125*    Brief HPI:   Michael Hooper is a 53 y.o. 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 He had brief LOC and was evaluated at [facility] on 12/06/13. Work up revealed bilateral temporal bone fractures with extension of right temporal fracture into right sphenoid bone transversing the right sphenoid sinus, disruption of right ossicular chain, basilar skull fracture, L1-L4 transverse process fractures with moderate displacement and associated hemorrhage right quadratus lumborum and neighboring muscles. CTA head/neck negative for carotid or basilar artery injury. Dr. Jule Ser consulted for input and recommended ENT evaluation for Right VI nerve palsy, Right VII peripheral nerve palsy and loss of hearing in the right ear. Conservative management with monitoring for complications/CSF leak recommended. Dr. Kelly Splinter consulted and repaired right ear full thickness laceration and left upper eye lid stellate laceration past admission. Patient with reports of blurred and double vision right eye with vertigo and decreased hearing and complete right peripheral facial paralysis per Dr. Lazarus Salines. He consulted Dr. Delfino Lovett at [facility] who recommended monitoring for spontaneous recovery with consideration for decompression of facial nerve and Ossiculoplasty later when stabilized. Patient started on high dose steroids for treatment as well  as taping OD at nights--to follow up with ENT in a week and Opth consult recommended for diplopia. Therapy evaluations done and patient limited by back pain as well as dizziness with diplopia. CIR was recommended for  follow up therapy.   Hospital Course: Michael Hooper was admitted to rehab 12/09/2013 for inpatient therapies to consist of PT, ST and OT at least three hours five days a week. Past admission physiatrist, therapy team and rehab RN have worked together to provide customized collaborative inpatient rehab. He reported issues problems with pain control contributing to insomnia. He was started on OxyContin for more consistent pain relief and back pain has improved on this. Low dose Topamax was added to help with complaints of constant headaches.  Slow prednisone taper was initiated and CBGs were ordered to monitor blood sugars. Hgb A1c is 5.7 and steroid induced hyperglycemia has been treated with SSI. He continues to have right facial paresis with difficulty maintaining right lid closure. Patient and wife were educated on protective measures to prevent corneal abrasion and eye drops were scheduled on qid basis with patient advised to use this additional prn irritation.   Admission labs were done revealing recurrent hypokalemia and Kdur was added for supplement while on steroids. Reactive leucocytosis is resolving. Right ear bolster and sutures was removed without difficulty on 11/24 and sutures on left eye lid were trimmed. Patient's mood has been stable and LCSW has followed along with for support provided to patient and wife. Follow up CT of head shows great improvement in edema and no hemorrhage or extra-axial blood noted. MRI of back was done due to complaints of intermittent numbness of right thigh as well as back pain with certain activities. MRI revealed displaced L1-L4  transverse process fractures with soft tissue inflammation but no acute osseous or spinal cord abnormality.  Vestibular symptoms have improved with habituation exercises. Po intake is good and he is tolerating regular textures without difficutly. He has made good progress and was independent for mobility as well as self care tasks. He is to  continue to receive follow [facility] past discharge.    Rehab course: During patient's stay in rehab weekly team conferences were held to monitor patient's progress, set goals and discuss barriers to discharge. Patient has had improvement in activity tolerance, balance, postural control, as well as ability to compensate for deficits. Patient is modified independent for transfers and is able to ambulate 150 feet independently without AD. He is able to complete bathing and dressing tasks independently. He is modified independent for compensatory strategies to clear buccal pocketing as well as oromotor exercises.  [facility] and currently speech is 100% intelligible at sentence level with compensatory stratergies. Formal family education not completed as patient is able to direct care as needed.    Disposition: Home  Diet: Regular.   Special Instructions: 1. No driving. No strenuous activity.  2. Tape right eye shut when sleeping. Use eye drops thorough out the day to keep it lubricated     Medication List    TAKE these medications        acetaminophen 325 MG tablet  Commonly known as:  TYLENOL  Take 2 tablets (650 mg total) by mouth every 6 (six) hours as needed for mild pain.     ALPRAZolam 0.5 MG tablet  Commonly known as:  XANAX  Take 1 tablet (0.5 mg total) by mouth 3 (three) times daily as needed for anxiety.  artificial tears Oint ophthalmic ointment  Place 1 application into the right eye at bedtime. Apply to eye and then tape lid and cover with patch     loratadine 10 MG tablet  Commonly known as:  CLARITIN  Take 10 mg by mouth daily.     OxyCODONE 10 mg T12a 12 hr tablet--Rx # 30 pills   Commonly known as:  OXYCONTIN  Take 1 tablet (10 mg total) by mouth at bedtime.     Oxycodone HCl 10 MG Tabs--Rx # 60 pills   Take 0.5-1 tablets (5-10 mg total) by mouth every 6 (six) hours as needed (pain).      pantoprazole 40 MG tablet  Commonly known as:  PROTONIX  Take 1 tablet (40 mg total) by mouth daily.     polyethylene glycol packet  Commonly known as:  MIRALAX / GLYCOLAX  Take 17 g by mouth daily.     polyvinyl alcohol 1.4 % ophthalmic solution  Commonly known as:  LIQUIFILM TEARS  Place 1 drop into both eyes 4 (four) times daily -  with meals and at bedtime.     potassium chloride SA 20 MEQ tablet  Commonly known as:  K-DUR,KLOR-CON  Take 1 tablet (20 mEq total) by mouth daily.     predniSONE 20 MG tablet  Commonly known as:  DELTASONE  Take three pills daily from 11/20-1122. Decrease to 2 1/2 pills11/23- 11/25. Take 2 pills 11/26-11/28. Take 1 1/2 pill 11/29-12/1. Take l pill 12/2- 12/4. Take 1/2 pill 12/5 - 12/7.  pills for     topiramate 50 MG tablet  Commonly known as:  TOPAMAX  Take 1 tablet (50 mg total) by mouth at bedtime.       Follow-up Information    Follow up with Ranelle OysterSWARTZ,ZACHARY T, MD On 01/03/2014.   Specialty:  Physical Medicine and Rehabilitation   Why:  BE there at 11:30 for 12:00 pm  appointment    Contact information:   [address] [place] [address] [phone]       Follow up with Hewitt ShortsNUDELMAN,ROBERT W, MD. Call today.   Specialty:  Neurosurgery   Why:  for follow up appointment   Contact information:   [address] [place] [address] [phone]       Follow up with Flo ShanksWOLICKI, KAROL, MD. Call today.   Specialty:  Otolaryngology   Why:  for follow up appointment   Contact information:   [address] [place] [address] [phone]       Follow up with Corinda GublerSPENCER,MICHAEL A, MD On 12/17/2013.   Specialty:  Ophthalmology   Why:  Be there at 2:45 for eye exam.     Contact information:   [address] [place] [address] [phone]       Signed: Jacquelynn CreeLove, Pamela S 12/15/2013, 12:27 PM

## 2013-12-13 NOTE — Progress Notes (Signed)
Social Work  Social Work Assessment and Plan  Patient Details  Name: Michael Hooper MRN: [id number] Date of Birth: [birth year]  Today's Date: 12/10/2013  Problem List:  Patient Active Problem List   Diagnosis Date Noted  . 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 Blunt trauma 12/09/2013  . 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 Facial laceration 12/09/2013  . Lumbar transverse process fracture 12/09/2013  . Pneumocephalus, traumatic 12/09/2013  . Traumatic ossicular dislocation 12/09/2013  . Concussion 12/09/2013  . 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 TBI (traumatic brain injury) 12/09/2013  . Skull base fx 12/06/2013   Past Medical History:  Past Medical History  Diagnosis Date  . Asthma    Past Surgical History: History reviewed. No pertinent past surgical history. Social History:  reports that he has never smoked. He does not have any smokeless tobacco history on file. He reports that he drinks about 2.4 oz of alcohol per week. He reports that he does not use illicit drugs.  Family / Support Systems Marital Status: Married Patient Roles: Spouse, Parent (Has a wife and 2 sons.) Spouse/Significant Other: wife, Michael Hooper @ [phone](C) [phone] Children: two adult sons with one living at home Anticipated Caregiver: self and wife Ability/Limitations of Caregiver: Wife works.  She was on disability and has been back to work 1 week.  Only has 4 weeks of unpaid FMLA left.  She feels she needs to work. Caregiver Availability: Evenings only Family Dynamics: Wife very attentive and involved in pt's care.  Feels she needs to be here on unit due to pt's hearing and visual limitations.  Social History Preferred language: English Religion: Unknown Cultural Background: NA Education: HS Read: Yes Write: Yes Employment Status: Employed Name of Employer: [employer] Length of Employment: 22 (yrs) Return to Work  Plans: Pt intends to return to work as soon as medically cleared but concerns over what his lingering deficits might be with his hearing and vision. 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  (Must speak loudly due to loss of hearing in right ear.)  He admits he is having "alot of anxiety" about his loss of hearing and visual issues.  Wife reports that he "won't say anything when he doesn't hear them (staff).  He just  says 'yes'".  Will refer to neuropsychology for coping and will monitor through his stay. Recent Psychosocial Issues: None Pyschiatric History: None Substance Abuse History: None  Patient / Family Perceptions, Expectations & Goals Pt/Family understanding of illness & functional limitations: Pt and wife with god understanding of his injuries and current functional limitations/ need for CIR. Premorbid pt/family roles/activities: Pt was completely independent and working full time.  No limitations. Anticipated changes in roles/activities/participation: Pt with mod i goals but will likley have extensive f/u with opthamology and ENT Pt/family expectations/goals: "I don't know what to expect.Marland Kitchen.Marland Kitchen.I want to be able to see and hear again."  [employer] IncCommunity Resources Community Agencies: None Premorbid Home Care/DME Agencies: None Transportation available at discharge: yes Resource referrals recommended: Neuropsychology  Discharge Planning Living Arrangements: Spouse/significant other, Children Support Systems: Spouse/significant other, Children, Friends/neighbors Type of Residence: Private  residence Civil engineer, contractingnsurance Resources: Media plannerrivate Insurance (specify) (Worker's Comp) Architectinancial Resources: Employment, Other (Comment) (Worker's Comp) Surveyor, quantityinancial Screen Referred: No Living Expenses: Database administratorMotgage Money Management: Patient Does the patient have any problems obtaining your medications?: No Home Management: pt and wife Patient/Family Preliminary Plans: pt plans to return home with wife as primary support Social Work Anticipated Follow Up Needs: HH/OP Expected length of stay: 7-10 days  Clinical Impression Very pleasant gentleman here following a work related accident.  Most notable injuries are to his right ear and eye and staff will need to make accomodations for his deficits.  Admits feeling anxious primarily related to his limited hearing and vision but also of the "unknown" of his recovery overall.  Wife very involved and attentive.  Will follow for support and d/c planning coordination with WC CM.  Salli Bodin 12/10/2013, 4:50 PM

## 2013-12-13 NOTE — Progress Notes (Signed)
Speech Language Pathology Daily Session Note  Patient Details  Name: Michael Hooper MRN: [id number] Date of Birth: 10-30-1960  Today's Date: 12/13/2013 SLP Individual Time: 1100-1200 SLP Individual Time Calculation (min): 60 min  Short Term Goals: Week 1: SLP Short Term Goal 1 (Week 1): STGs=LTGs due to length of stay  Skilled Therapeutic Interventions: Skilled treatment session focused on speech and dysphagia goals. SLP facilitated session by providing intermittent supervision verbal cues for utilization of a slow rate of speech at the sentence level, however, patient was 100% intelligible. Patient also consumed snack of regular textures with thin liquids and was Mod I for utilization of swallowing compensatory strategies without overt s/s of aspiration.  Patient was also Mod I for recall of previous therapy events. Patient's cognitive function appears WFL and SLP will f/u X 1 for diet tolerance with a complete meal. Continue with current plan of care.    FIM:  Comprehension Comprehension Mode: Auditory Comprehension: 7-Follows complex conversation/direction: With no assist Expression Expression Mode: Verbal Expression: 7-Expresses complex ideas: With no assist Social Interaction Social Interaction: 7-Interacts appropriately with others - No medications needed. Problem Solving Problem Solving: 6-Solves complex problems: With extra time Memory Memory: 7-Complete Independence: No helper FIM - Eating Eating Activity: 6: More than reasonable amount of time  Pain Pain Assessment Pain Assessment: No/denies pain  Therapy/Group: Individual Therapy  Netta Fodge 12/13/2013, 3:16 PM

## 2013-12-13 NOTE — Evaluation (Signed)
Physical Therapy Vestibular Assessment and Plan  Patient Details  Name: Michael Hooper MRN: [id number] Date of Birth: [birth year]  PT Diagnosis: Dizziness and giddiness and Vertigo of central origin  Today's Date: 12/13/2013 PT Individual Time: 1400-1500 PT Individual Time Calculation (min): 60 min    Problem List:  Patient Active Problem List   Diagnosis Date Noted  . 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 Blunt trauma 12/09/2013  . 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 Facial laceration 12/09/2013  . Lumbar transverse process fracture 12/09/2013  . Pneumocephalus, traumatic 12/09/2013  . Traumatic ossicular dislocation 12/09/2013  . Concussion 12/09/2013  . 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 TBI (traumatic brain injury) 12/09/2013  . Skull base fx 12/06/2013    Past Medical History:  Past Medical History  Diagnosis Date  . Asthma    Past Surgical History: History reviewed. No pertinent past surgical history.  Assessment & Plan  Subjective:    Pt reporting being "off balance" or disequilibrium, esp with sudden head movements or transitional movements lasting up to 30 secs at the most, however did not rate during our session.  Also note photophobia, therefore performed most of eval with low light.  Mitigated by replacing eye patch or performing visual targeting during movements.  Pt with sensation of increased pressure on inside of eye esp when having to track inward.     Eye Alignment R eye adducted at rest  Spontaneous  Nystagmus None noted  Gaze holding nystagmus None noted  Smooth pursuit Delayed in R eye, was able to track to R field but not full ROM  Oculomotor See above  Saccades No true over/under shooting, only decreased speed and range on R  VOR slow WFL  Head Thrust Test (+) with R head thrust, no nystagmus  Head Shaking Nystagmus Not tested  Rt. Hallpike Dix Pt unable to  tolerate but no c/o dizziness or "room spinning" Did note vertical nystagmus in this position.    Heloise OchoaLt. Hallpike Dix Not tested  Rt. Roll Test Not tested  Lt. Roll Test  Not tested  Motion sensitivity Not tested  Convergence Diplopia noted when target far from face, however pt able to converge and noted that diplopia decreased the closer the target was to his face.    Visual- Vestibular Interactions:  - Walking:  Left lateral sway with horizontal head turns during gait.  No overt LOB with vertical head turns.  No obvious coordination deficits with head turns during gait.    Modified CTSIB: No visual or somatosensory deficits noted during testing, however when vision and somatosensory withdrawn, pt demonstrates increased anterior/posterior sway, but was able to utilize ankle and hip strategy to maintain balance.    Recommendations: 1)  Follow up with OT for use of translucent tape over R lense of glasses to address double vision.  Question effectiveness of double vision/occulomotor exercises.   Will follow up with OT.   2) VOR/gaze stabilization exercises.  Can begin in sitting in room (OT has already provided with these exercises) in all fields (horizontal, vertical and diagonal).  Progress to standing and work towards gait with head turns in same fashion.  Also work towards increasing background from solid (placing A on solid color) and working towards busier backgrounds (can place A on wrapping paper or even on TV).   Incorporate all head movements into dynamic gait/balance treatments and in various environments.     Harriet ButteParcell, Symir Mah  Ann MPT, PT Edman CircleAudra Hall, DPT, PT 12/13/2013, 3:58 PM

## 2013-12-13 NOTE — Progress Notes (Signed)
Social Work Patient ID: Michael Hooper, male   DOB: 1960-09-05, 53 y.o.   MRN: [id number]   Received call this morning from pt's Worker's Comp CM, Fran Lowes, reporting that pt and wife with multiple concerns about care over the weekend and they wanted to pursue a transfer to [facility]).  I have spoken with pt and wife directly and they confirm they do wish to pursue transfer to [facility] and I have begun process for a review.   I did alert Larina Bras, Director of CIR, Fermin Schwab and pt's bedside RN, Rayetta Pigg of the concerns.  Larina Bras has met with pt and heard concerns and I have followed up with pt and wife as well.   At this time I am still awaiting response from [facility] as to whether or not they can offer a bed.  Will keep staff posted.  Jaquelyn Sakamoto, LCSW

## 2013-12-13 NOTE — Progress Notes (Signed)
[place] PHYSICAL MEDICINE & REHABILITATION     PROGRESS NOTE    Subjective/Complaints: Right eye still weak, a little irritated. Occ "numbness" in feet only in the morning still. Feels he's progressing. Pain better controlled as a whole.    Review of Systems - Negative except as above Objective: Vital Signs: Blood pressure 140/88, pulse 77, temperature 97.6 F (36.4 C), temperature source Oral, resp. rate 20, SpO2 94 %. Ct Head Wo Contrast  12/13/2013   CLINICAL DATA:  Followup basilar skull fracture. Right hearing loss.  EXAM: CT HEAD WITHOUT CONTRAST  TECHNIQUE: Contiguous axial images were obtained from the base of the skull through the vertex without intravenous contrast.  COMPARISON:  12/07/2013  FINDINGS: Again the brain itself appears normal without evidence of atrophy, old or acute infarction, mass lesion, hemorrhage, hydrocephalus or extra-axial collection. One could question mild edema in the right temporal lobe, but this is not definite. Temporal bone fracture on the right is known present from previous detail temporal bone examinations, with disruption of the ossicular chain. Fluid persists throughout both mastoid regions, right more than left.  IMPRESSION: No definite intracranial abnormality. Question mild edema in the right temporal lobe, not definite. No hemorrhage or extra-axial blood.  Fluid persisting within the mastoid air cells, more extensive on the right than the left, with evidence of ossicular chain disruption as better shown on previous dedicated temporal bone CT studies.   Electronically Signed   By: Paulina FusiMark  Shogry M.D.   On: 12/13/2013 07:52   No results for input(s): WBC, HGB, HCT, PLT in the last 72 hours. No results for input(s): NA, K, CL, GLUCOSE, BUN, CREATININE, CALCIUM in the last 72 hours.  Invalid input(s): CO CBG (last 3)   Recent Labs  12/12/13 0825 12/12/13 1941 12/13/13 0614  GLUCAP 125* 151* 121*    Wt Readings from Last 3 Encounters:   12/06/13 114.7 kg (252 lb 13.9 oz)    Physical Exam:  HENT: oral mucosa pink and moist Head: Normocephalic.  Right ear with surgical dressing. Sutures intact Eyes: Pupils are equal, round, and reactive to light. Right conjunctiva is injected somewhat, large amounts of ointment in and around eye Laceration on left lid with sutures in place.  Neck: Normal range of motion. Neck supple.  Respiratory: Effort normal and breath sounds normal.  GI: Soft. Bowel sounds are normal. He exhibits no distension. There is no tenderness.  Musculoskeletal: He exhibits no edema or tenderness.  Low back pain with SLR on right.  Neurological: He is alert and oriented to person, place, and time.  Speech clear. HOH. Unable to move right eye laterally with complaints of diplopia in right lateral field. Right facial paresis. Cannot close right eye lid completely.  Follows commands without difficulty. fair insight and awareness. UE: 4+/5 prox to distal. LE's 4 to 4+/5 prox to distal. No sensory findings. dtr's 1+ Skin: Skin is warm and dry.  Large ecchymotic area on mid lower back.     Assessment/Plan: 1. Functional deficits secondary to TBI with temporal skull fracture/polytrauma which require 3+ hours per day of interdisciplinary therapy in a comprehensive inpatient rehab setting. Physiatrist is providing close team supervision and 24 hour management of active medical problems listed below. Physiatrist and rehab team continue to assess barriers to discharge/monitor patient progress toward functional and medical goals. Discussed time course of recovery, discussed CT Brain, and Lumbar findings, pain management.  Answered Pt and wife questions   FIM: FIM - Bathing Bathing Steps Patient  Completed: Chest, Right Arm, Left Arm, Abdomen, Front perineal area, Buttocks Bathing: 5: Supervision: Safety issues/verbal cues  FIM - Upper Body Dressing/Undressing Upper body dressing/undressing steps patient  completed: Thread/unthread right sleeve of pullover shirt/dresss, Pull shirt over trunk, Thread/unthread left sleeve of pullover shirt/dress, Put head through opening of pull over shirt/dress Upper body dressing/undressing: 5: Supervision: Safety issues/verbal cues FIM - Lower Body Dressing/Undressing Lower body dressing/undressing steps patient completed: Thread/unthread right underwear leg, Fasten/unfasten pants, Thread/unthread left underwear leg, Pull underwear up/down, Thread/unthread right pants leg, Thread/unthread left pants leg, Pull pants up/down Lower body dressing/undressing: 5: Supervision: Safety issues/verbal cues  FIM - Toileting Toileting: 0: Activity did not occur  FIM - Diplomatic Services operational officerToilet Transfers Toilet Transfers Assistive Devices: Art gallery managerWalker Toilet Transfers: 0-Activity did not occur  FIM - BankerBed/Chair Transfer Bed/Chair Transfer Assistive Devices: Therapist, occupationalWalker Bed/Chair Transfer: 5: Bed > Chair or W/C: Supervision (verbal cues/safety issues)  FIM - Locomotion: Wheelchair Distance: 150 Locomotion: Wheelchair: 0: Activity did not occur FIM - Locomotion: Ambulation Locomotion: Ambulation Assistive Devices:  (none) Ambulation/Gait Assistance: 5: Supervision Locomotion: Ambulation: 5: Travels 150 ft or more with supervision/safety issues  Comprehension Comprehension Mode: Visual Comprehension: 4-Understands basic 75 - 89% of the time/requires cueing 10 - 24% of the time  Expression Expression Mode: Verbal Expression: 5-Expresses complex 90% of the time/cues < 10% of the time  Social Interaction Social Interaction: 6-Interacts appropriately with others with medication or extra time (anti-anxiety, antidepressant).  Problem Solving Problem Solving: 5-Solves complex 90% of the time/cues < 10% of the time  Memory Memory: 6-More than reasonable amt of time Medical Problem List and Plan: 1. Functional deficits secondary to Temporal skull fracture with traumatic brain injury and  polytrauma  -Head CT without change 2. DVT Prophylaxis/Anticoagulation: Mechanical: Sequential compression devices, below knee Bilateral lower extremities 3. Pain Management: Continue oxycodone prn.  4. Mood: LCSW to follow for evaluation and support.  5. Neuropsych: This patient is capable of making decisions on his own behalf. 6. Skin/Wound Care: Routine pressure relief measures. Monitor wound for healing.  7. Fluids/Electrolytes/Nutrition: Monitor I/O. Advance to regular diet today 8. Right ossicular disruption: To continue high dose steriods for a week followed by slow taper over 3 weeks per Dr. Lazarus SalinesWolicki,   -hearing impaired but able to communicate with clear, loud speech 9. Leucocytosis: Monitor for signs of infection or fevers. steroids tapering 10. Steroid induced Hyperglycemia: dc cbg checks  11. Basilar skull fracture: CT unchanged.   12. Right CN VII,VI palsy: Eye drops thorough out the day with patching at bedtime. Patient educated on need for patching/moisture to prevent scleraeal abrasion.  -might do well with swim goggle to cover eye and keep a moist environment?  13. Headaches: topamax added to help with symptoms.--has helped  14. Back pain: consider additional imaging of lumbar spine if pain worsens--stable today  - no signs of myelopathy on exam  15. Elevated BP this am monitor , no hx HTN   LOS (Days) 4 A FACE TO FACE EVALUATION WAS PERFORMED  Genavieve Mangiapane T 12/13/2013 8:27 AM

## 2013-12-13 NOTE — Progress Notes (Signed)
Occupational Therapy Session Note  Patient Details  Name: Michael GreeningJames Barrales MRN: [id number] Date of Birth: December 21, 1960  Today's Date: 12/13/2013 OT Individual Time: [id number] and 1300-1400 OT Individual Time Calculation (min): 60 min and 60 min     Short Term Goals: Week 1:  OT Short Term Goal 1 (Week 1): STGs=LTGs  Skilled Therapeutic Interventions/Progress Updates:    Session 1: Pt seen for ADL retraining with focus on visual deficits, gaze stabilization, standing balance, and functional mobility. Pt received supine in bed agreeable to therapy at this time. Pt ambulated to bathroom without AD and close supervision. Pt completed bathing and dressing at supervision level overall and pt tolerating not wearing eye patch grossly 40 min during self-care tasks. Emphasis on horizontal head turns to locate clothing items while in sitting with pt reporting minimal double vision. Completed home management task of making bed with emphasis on gaze stabilization and vertical and horizontal head movements during functional activity. Pt wore eye patch for 75% of task, reporting increase in double vision when eye patch removed. Encouraged pt to switch patch to L eye with 1 hour break before next therapy session and pt agreeable. Pt left sitting in recliner chair with all needs in reach and no questions at this time.    Session 2: Pt seen for therapeutic co-treat with RT (LS) with emphasis on compensatory strategies for vertigo deficits, visual scanning, gaze stabilization, standing balance, and functional mobility. Pt received sitting in recliner chair with wife present. Educated wife on vestibular eval this afternoon. Pt engaged in ball toss activity in sitting then progressing to standing initially with eye patch over right eye then switching patch to left eye. Pt progressed to not wearing eye patch, however continued to have diplopia in right visual field. Emphasis on head movements in horizontal and vertical  directions with little to no dizziness noted when wearing eye patch. Pt required supervision for standing balance during activity. Ambulated inside and outside of hospital in community environment with pt initiating slowing pace d/t vestibular deficits. Pt with improved head movements to locate obstacles on ground for safety and no dizziness reported. Pt required supervision for community task with LOB 3x and pt self-correcting. Upon return to unit, ambulated while dribbling and catching ball at supervision level for balance and intermittent dizziness noted. Practiced reaching into overhead and low cabinets in kitchen with emphasis on squatting to prevent back pain/discomfort. Pt returned to room and left with all needs in reach.   Therapy Documentation Precautions:  Precautions Precautions: Fall Precaution Comments: vertigo symptoms with increased head movement Restrictions Weight Bearing Restrictions: No General:   Vital Signs:   Pain: Pt with discomfort in low back at times.   See FIM for current functional status  Therapy/Group: Individual Therapy  Daneil Danerkinson, Annalysse Shoemaker N 12/13/2013, 12:07 PM

## 2013-12-13 NOTE — Progress Notes (Signed)
Physical Therapy Session Note  Patient Details  Name: Michael GreeningJames Piontek MRN: [id number] Date of Birth: 1960/12/17  Today's Date: 12/13/2013 PT Individual Time: 1530-1600 PT Individual Time Calculation (min): 30 min   Short Term Goals: Week 1:  PT Short Term Goal 1 (Week 1): STGs=LTGs secondary to ELOS and CLOF  Skilled Therapeutic Interventions/Progress Updates:    Patient received sitting in chair. Session focused on horizontal and vertical head turns during gait (see details below) and horizontal head turns in sitting progressing to standing with ball toss to facilitate VOR and visual scanning. Functional ambulation >150' x2 (to/from room to gym) with min guard, no AD. All activities performed without eye patch or glasses. Patient reporting diplopia and distortion with all mobility activities, but no vertigo symptoms or nausea.  Patient returned to room and performed toileting at ambulatory/standing level and supervision. Patient left sitting in chair with all needs within reach.  Therapy Documentation Precautions:  Precautions Precautions: Fall Precaution Comments: vertigo symptoms with increased head movement Restrictions Weight Bearing Restrictions: No Pain: Pain Assessment Pain Assessment: No/denies pain Pain Score: 0-No pain Locomotion : Ambulation Ambulation/Gait Assistance: 5: Supervision High Level Ambulation High Level Ambulation: Head turns Head Turns: 400'x1 with horizontal head turns and 300' x1 with vertical head turns, min guard overall; no overt LOB, but patient with c/o double vision and distorted picture throughout. Performed without use of eye patch or glasses   See FIM for current functional status  Therapy/Group: Individual Therapy  Chipper HerbBridget S Waleed Dettman S. Della Homan, PT, DPT 12/13/2013, 4:38 PM

## 2013-12-14 ENCOUNTER — Inpatient Hospital Stay (HOSPITAL_COMMUNITY): Payer: Worker's Compensation | Admitting: Speech Pathology

## 2013-12-14 ENCOUNTER — Inpatient Hospital Stay (HOSPITAL_COMMUNITY): Payer: Worker's Compensation | Admitting: *Deleted

## 2013-12-14 ENCOUNTER — Inpatient Hospital Stay (HOSPITAL_COMMUNITY): Payer: Self-pay | Admitting: *Deleted

## 2013-12-14 ENCOUNTER — Inpatient Hospital Stay (HOSPITAL_COMMUNITY): Payer: Worker's Compensation

## 2013-12-14 LAB — GLUCOSE, CAPILLARY: Glucose-Capillary: 152 mg/dL — ABNORMAL HIGH (ref 70–99)

## 2013-12-14 NOTE — Plan of Care (Signed)
Problem: RH Swallowing Goal: LTG Patient will consume least restrictive PO diet (SLP) LTG: Patient will consume least restrictive PO diet with assist for use of compensatory strategies (SLP)  Outcome: Completed/Met Date Met:  12/14/13  Problem: RH Expression Communication Goal: LTG Patient will increase speech intelligibility (SLP) LTG: Patient will increase speech intelligibility at word/phrase/conversation level with cues, % of the time (SLP)  Outcome: Completed/Met Date Met:  12/14/13  Problem: RH Memory Goal: LTG Patient will demonstrate ability for day to day (SLP) LTG: Patient will demonstrate ability for day to day recall/carryover during cognitive/linguistic activities with assist (SLP)  Outcome: Completed/Met Date Met:  12/14/13

## 2013-12-14 NOTE — Progress Notes (Signed)
[place] PHYSICAL MEDICINE & REHABILITATION     PROGRESS NOTE    Subjective/Complaints: Events of yesterday noted. Pt doing well. Had a good night sleep. Pain under reasonable control at present    Review of Systems - Negative except as above Objective: Vital Signs: Blood pressure 138/83, pulse 78, temperature 98 F (36.7 C), temperature source Oral, resp. rate 18, SpO2 95 %. Ct Head Wo Contrast  12/13/2013   CLINICAL DATA:  Followup basilar skull fracture. Right hearing loss.  EXAM: CT HEAD WITHOUT CONTRAST  TECHNIQUE: Contiguous axial images were obtained from the base of the skull through the vertex without intravenous contrast.  COMPARISON:  12/07/2013  FINDINGS: Again the brain itself appears normal without evidence of atrophy, old or acute infarction, mass lesion, hemorrhage, hydrocephalus or extra-axial collection. One could question mild edema in the right temporal lobe, but this is not definite. Temporal bone fracture on the right is known present from previous detail temporal bone examinations, with disruption of the ossicular chain. Fluid persists throughout both mastoid regions, right more than left.  IMPRESSION: No definite intracranial abnormality. Question mild edema in the right temporal lobe, not definite. No hemorrhage or extra-axial blood.  Fluid persisting within the mastoid air cells, more extensive on the right than the left, with evidence of ossicular chain disruption as better shown on previous dedicated temporal bone CT studies.   Electronically Signed   By: Paulina FusiMark  Shogry M.D.   On: 12/13/2013 07:52   No results for input(s): WBC, HGB, HCT, PLT in the last 72 hours. No results for input(s): NA, K, CL, GLUCOSE, BUN, CREATININE, CALCIUM in the last 72 hours.  Invalid input(s): CO CBG (last 3)   Recent Labs  12/13/13 0614 12/13/13 0919 12/13/13 1841  GLUCAP 121* 122* 149*    Wt Readings from Last 3 Encounters:  12/06/13 114.7 kg (252 lb 13.9 oz)    Physical  Exam:  HENT: oral mucosa pink and moist Head: Normocephalic.  Right ear with surgical dressing. Sutures intact Eyes: Pupils are equal, round, and reactive to light. Right conjunctiva is injected somewhat, large amounts of ointment in and around eye Laceration on left lid with sutures in place.  Neck: Normal range of motion. Neck supple.  Respiratory: Effort normal and breath sounds normal.  GI: Soft. Bowel sounds are normal. He exhibits no distension. There is no tenderness.  Musculoskeletal: He exhibits no edema or tenderness.  Low back pain with SLR on right.  Neurological: He is alert and oriented to person, place, and time.  Speech clear. HOH. Unable to move right eye laterally with complaints of diplopia in right lateral field. Right facial paresis. Cannot close right eye lid completely.  Follows commands without difficulty. fair insight and awareness. UE: 4+/5 prox to distal. LE's 4 to 4+/5 prox to distal. No sensory findings. dtr's 1+ Skin: Skin is warm and dry.  Large ecchymotic area on mid lower back.     Assessment/Plan: 1. Functional deficits secondary to TBI with temporal skull fracture/polytrauma which require 3+ hours per day of interdisciplinary therapy in a comprehensive inpatient rehab setting. Physiatrist is providing close team supervision and 24 hour management of active medical problems listed below. Physiatrist and rehab team continue to assess barriers to discharge/monitor patient progress toward functional and medical goals.  Pt without new issues. [facility]. Pt is doing very well, however and nearly ready for dc home.   FIM: FIM - Bathing Bathing Steps Patient Completed: Chest, Right Arm,  Left Arm, Abdomen, Front perineal area, Buttocks, Right upper leg, Left upper leg, Right lower leg (including foot), Left lower leg (including foot) Bathing: 5: Supervision: Safety issues/verbal cues  FIM - Upper Body  Dressing/Undressing Upper body dressing/undressing steps patient completed: Thread/unthread right sleeve of pullover shirt/dresss, Pull shirt over trunk, Thread/unthread left sleeve of pullover shirt/dress, Put head through opening of pull over shirt/dress Upper body dressing/undressing: 6: More than reasonable amount of time FIM - Lower Body Dressing/Undressing Lower body dressing/undressing steps patient completed: Thread/unthread right underwear leg, Fasten/unfasten pants, Thread/unthread left underwear leg, Pull underwear up/down, Thread/unthread right pants leg, Thread/unthread left pants leg, Pull pants up/down, Don/Doff left sock, Don/Doff right sock, Don/Doff right shoe, Don/Doff left shoe Lower body dressing/undressing: 5: Set-up assist to: Don/Doff TED stocking  FIM - Toileting Toileting: 0: Activity did not occur  FIM - Diplomatic Services operational officerToilet Transfers Toilet Transfers Assistive Devices: Art gallery managerWalker Toilet Transfers: 0-Activity did not occur  FIM - BankerBed/Chair Transfer Bed/Chair Transfer Assistive Devices: Therapist, occupationalWalker Bed/Chair Transfer: 5: Bed > Chair or W/C: Supervision (verbal cues/safety issues), 5: Chair or W/C > Bed: Supervision (verbal cues/safety issues)  FIM - Locomotion: Wheelchair Distance: 150 Locomotion: Wheelchair: 0: Activity did not occur FIM - Locomotion: Ambulation Locomotion: Ambulation Assistive Devices: Other (comment) (none) Ambulation/Gait Assistance: 5: Supervision Locomotion: Ambulation: 5: Travels 150 ft or more with supervision/safety issues  Comprehension Comprehension Mode: Auditory Comprehension: 7-Follows complex conversation/direction: With no assist  Expression Expression Mode: Verbal Expression: 7-Expresses complex ideas: With no assist  Social Interaction Social Interaction: 7-Interacts appropriately with others - No medications needed.  Problem Solving Problem Solving: 6-Solves complex problems: With extra time  Memory Memory: 7-Complete Independence: No  helper Medical Problem List and Plan: 1. 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 DVT Prophylaxis/Anticoagulation: Mechanical: Sequential compression devices, below knee Bilateral lower extremities 3. Pain Management: Continue oxycodone prn.  4. Mood: LCSW to follow for evaluation and support.  5. Neuropsych: This patient is capable of making decisions on his own behalf. 6. Skin/Wound Care: Routine pressure relief measures. Monitor wound for healing.  7. Fluids/Electrolytes/Nutrition: Monitor I/O. Advance to regular diet today 8. Right ossicular disruption: To continue high dose steriods for a week followed by slow taper over 3 weeks per Dr. Lazarus SalinesWolicki,   -hearing impaired but able to communicate with clear, loud speech 9. Leucocytosis: Monitor for signs of infection or fevers. steroids tapering 10. Steroid induced Hyperglycemia: dc cbg checks  11. Basilar skull fracture: CT unchanged.   12. Right CN VII,VI palsy: Eye drops thorough out the day with patching at bedtime. Patient educated on need for patching/moisture to prevent scleraeal abrasion.  -might do well with swim goggle to cover eye and keep a moist environment?  13. Headaches: topamax added to help with symptoms.--has helped  14. Back pain: consider additional imaging of lumbar spine if pain worsens or neuro changes  -Right L1-4 TP fx'es---stable. Intramuscular hematoma in low back musculature  - no signs of myelopathy on exam  15. Elevated BP this am monitor , no hx HTN   LOS (Days) 5 A FACE TO FACE EVALUATION WAS PERFORMED  SWARTZ,ZACHARY T 12/14/2013 8:10 AM

## 2013-12-14 NOTE — Progress Notes (Signed)
Social Work Patient ID: Michael GreeningJames Hooper, male   DOB: [birth year], 53 y.o.   MRN: [id number]   [facility] that they do NOT feel pt appropriate for admission as he is so close to being ready for d/c.  Worker's Comp CM and patient are aware and pt reports plans to complete his rehabilitation here with us.  Marissa NestlePam Love, PA in the room and reviewing plans for scans, consults, etc.  WC CM to follow up with wife.  Continue to follow.  Danny Yackley, LCSW

## 2013-12-14 NOTE — Progress Notes (Signed)
Physical Therapy Session Note  Patient Details  Name: Michael Hooper MRN: [id number] Date of Birth: [birth year]  Today's Date: 12/14/2013 PT Individual Time: 1530-1630 PT Individual Time Calculation (min): 60 min   Short Term Goals: Week 1:  PT Short Term Goal 1 (Week 1): STGs=LTGs secondary to ELOS and CLOF  Skilled Therapeutic Interventions/Progress Updates:    Patient received sitting in chair. Session focused on high level balance and functional mobility. R eye patch donned for session due to R eye fatigue. Re-iterated/Educated patient about use of R eye patch when not in therapy to allow R eye to rest. Patient performing functional ambulation around unit, >150' several instances with supervision and increased time. Stair negotiation x12 stairs with R handrail and supervision. Discussion/education about falls risk at home (due to vestibular, visual, and balance deficits) and indications vs. Contraindications for completing floor transfer vs. Calling EMS s/p fall. Patient completed floor transfer with supervision for balance and visual demonstration prior.  High level balance activities; see details below. Emphasis on decreasing UE support on compliant surfaces, mini squats on BOSU; rebounder with 4# ball: on foam, narrow BOS on foam, on foam wedge to promote ankle strategies. Patient made mod I in room, RN aware.  Therapy Documentation Precautions:  Precautions Precautions: Fall Precaution Comments: vertigo symptoms with increased head movement Restrictions Weight Bearing Restrictions: No Pain: Pain Assessment Pain Assessment: No/denies pain Pain Score: 0-No pain (no pain but reports pressure in R eye) Locomotion : Ambulation Ambulation/Gait Assistance: 5: Supervision  Balance: Dynamic Standing Balance Dynamic Standing - Balance Support: Bilateral upper extremity supported;No upper extremity supported;Left upper extremity supported;Right upper extremity supported (varying levels of  UE support) Dynamic Standing - Level of Assistance: 4: Min assist;5: Stand by assistance Dynamic Standing - Balance Activities: Compliant surfaces;Rocker board;Other (comment) (rocker board anterior/posterior and lateral positions, BOSU)  See FIM for current functional status  Therapy/Group: Individual Therapy  Chipper HerbBridget S Lachrista Heslin S. Mitchael Luckey, PT, DPT 12/14/2013, 4:56 PM

## 2013-12-14 NOTE — Progress Notes (Signed)
Physical Therapy Session Note  Patient Details  Name: Michael GreeningJames Hooper MRN: [id number] Date of Birth: 1960-02-12  Today's Date: 12/14/2013 PT Individual Time: 0800-0900 PT Individual Time Calculation (min): 60 min   Short Term Goals: Week 1:  PT Short Term Goal 1 (Week 1): STGs=LTGs secondary to ELOS and CLOF  Skilled Therapeutic Interventions/Progress Updates:    Pt was found in room supine in bed. Session focused on functional mobility, balance, and vestibular habituation. Pt functionally ambulated 200' with close supervision room>ADL apartment; pt performed furniture transfer STS with supervision. Pt performed car transfer (sedan height) with supervision and min verbal cues for sequencing. Pt went up and down 5 steps x2 performed using B handrail>one handrail on L; overall close supervision with stairs. NMR activities addressing vestibular system dysfunction included the following: Pt performed standing foam exercises, starting in rhomberg>eyes closed, incorporated vestibular training with VOR (inferior/superior and medial/lateral)>VOR x2 >VOR cancellation. Pt performed walk with ball toss in hallway x 100' (min verbal cues to track soccer ball with eyes) to challenge balance and vestibular system. Pt removed eye patch on R and walked in hospital hallway 100' with close supervision, pt reported "tunnel vision" without eye patch, but otherwise able to ambulate safely at decreased speed; addition of head turns (superior, inferior, medial, and lateral) during walking with min guard to prevent LOB.  Pt on Nustep for 10 minutes with resistance of 5 to assist with relaxation of lumbar muscles and increase endurance.Pt was left sitting up in recliner with all needs in reach and instructions on appropriate length of time for ice application; nurse made aware of pt's request for pain medication.  Therapy Documentation Precautions:  Precautions Precautions: Fall Precaution Comments: vertigo symptoms with  increased head movement Restrictions Weight Bearing Restrictions: No Locomotion : Ambulation Ambulation/Gait Assistance: 5: Supervision   See FIM for current functional status  Therapy/Group: Individual Therapy  Allee Busk 12/14/2013, 10:43 AM

## 2013-12-14 NOTE — Progress Notes (Signed)
Subjective: Patient sitting up in chair, without complaints.  Continuing to undergo rehabilitation, at the [facility] inpatient [facility] center.  CT of the brain yesterday shows complete clearing of small amount of pneumocephalus, that was associated with his basilar skull fracture.  There is no evidence of intracranial hemorrhage, mass effect or shift.  Objective: Vital signs in last 24 hours: Filed Vitals:   12/12/13 2232 12/13/13 0632 12/13/13 1700 12/14/13 0550  BP: 150/90 140/88 122/85 138/83  Pulse: 68 77 87 78  Temp:  97.6 F (36.4 C) 98.1 F (36.7 C) 98 F (36.7 C)  TempSrc:  Oral Oral Oral  Resp:  20 16 18   SpO2:  94% 95% 95%    Intake/Output from previous day: 11/16 0701 - 11/17 0700 In: 480 [P.O.:480] Out: 1000 [Urine:1000] Intake/Output this shift: Total I/O In: 240 [P.O.:240] Out: 200 [Urine:200]  Physical Exam:  Awake and alert, fully oriented.  Speech fluent, but mild dysarthria, following commands well.  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  Impaired hearing remains on the right side.  Moving all 4 extremities well.  No drift of the upper extremities.  Studies/Results: Ct Head Wo Contrast  12/13/2013   CLINICAL DATA:  Followup basilar skull fracture. Right hearing loss.  EXAM: CT HEAD WITHOUT CONTRAST  TECHNIQUE: Contiguous axial images were obtained from the base of the skull through the vertex without intravenous contrast.  COMPARISON:  12/07/2013  FINDINGS: Again the brain itself appears normal without evidence of atrophy, old or acute infarction, mass lesion, hemorrhage, hydrocephalus or extra-axial collection. One could question mild edema in the right temporal lobe, but this is not definite. Temporal bone fracture on the right is known present from previous detail temporal bone examinations, with disruption of the ossicular chain. Fluid persists throughout both mastoid regions, right more  than left.  IMPRESSION: No definite intracranial abnormality. Question mild edema in the right temporal lobe, not definite. No hemorrhage or extra-axial blood.  Fluid persisting within the mastoid air cells, more extensive on the right than the left, with evidence of ossicular chain disruption as better shown on previous dedicated temporal bone CT studies.   Electronically Signed   By: Paulina FusiMark  Shogry M.D.   On: 12/13/2013 07:52    Assessment/Plan: Doing well from a neurosurgical perspective.  CT of the brain looks good.  Will defer management of right sixth and seventh nerve dysfunction and loss of hearing on the right to the ENT, plastic surgery, and ophthalmology consultants.  No indication for further neurosurgical inpatient or outpatient follow-up at this time.  Hewitt ShortsNUDELMAN,ROBERT W, MD 12/14/2013, 10:18 AM

## 2013-12-14 NOTE — Progress Notes (Signed)
Speech Language Pathology Session Note & Discharge Summary  Patient Details  Name: Michael Hooper MRN: [id number] Date of Birth: Dec 31, 1960  Today's Date: 12/14/2013 SLP Individual Time: 1130-1155 SLP Individual Time Calculation (min): 25 min   Skilled Therapeutic Interventions: Skilled treatment session focused on dysphagia goals. Upon arrival, patient was sitting upright in his recliner with wife and workers comp representative present.  Patient consumed lunch meal of regular textures with thin liquids without overt s/s of aspiration and is Mod I for use of compensatory strategies to clear right buccal pocketing. Patient is 100% intelligible at the sentence level and recalled all events from previous therapy sessions with Mod I.  Patient and his wife educated on strategies to decrease xerostomia, maximize speech intelligibility and swallowing function. Patient will be discharged from skilled SLP caseload at this time due to patient being at his cognitive baseline and overall Mod I for speech intelligibility at the sentence level and utilization of swallowing compensatory strategies with regular textures and thin liquids without overt s/s of aspiration. Patient and wife verbalized understanding and agreement.    Patient has met 3 of 3 long term goals.  Patient to discharge at overall Modified Independent level.   Reasons goals not met: N/A   Clinical Impression/Discharge Summary: Patient has made functional gains and has met 3 of3 LTG's this admission due to increased working memory, swallowing function and speech intelligibility. Currently, patient is consuming regular textures with thin liquids without overt s/s of aspiration and is Mod I to self-monitor and correct right buccal pocketing. Patient is also 100% intelligible at the sentence level and is Mod I for use of speech intelligibility strategies.  Patient is also Mod I for recall of new, daily information.  Patient/family education is  complete and patient will be discharged from skilled SLP caseload at this time due to patient being at his cognitive baseline and overall Mod I for speech intelligibility and utilization of swallowing compensatory strategies. Patient and wife verbalized understanding and are in agreement.    Recommendation:  None      Equipment: N/A   Reasons for discharge: Treatment goals met   Patient/Family Agrees with Progress Made and Goals Achieved: Yes   See FIM for current functional status  Marygrace Sandoval 12/14/2013, 2:52 PM

## 2013-12-14 NOTE — Progress Notes (Signed)
Occupational Therapy Session Note  Patient Details  Name: Michael GreeningJames Talley MRN: [id number] Date of Birth: 29-Nov-1960  Today's Date: 12/14/2013 OT Individual Time: 0930-1030 and 1300-1350 OT Individual Time Calculation (min): 60 min and 50 min     Short Term Goals: Week 1:  OT Short Term Goal 1 (Week 1): STGs=LTGs  Skilled Therapeutic Interventions/Progress Updates:    Session 1: Pt seen for ADL retraining with focus on standing balance, functional mobility, visual scanning, and gaze stabilization. Pt received sitting in recliner chair. Therapist applied taping to glasses over right superior visual field to address double vision. Pt with inconsistent report of diplopia vs pressure in eye, however, taping appeared to be most effective in right superior field. Will follow-up in PM session. Completed functional mobility around room to retrieve self-care items at supervision level with emphasis on head movements. Pt completed bathing at Mod I level, sitting on TTB for 75% of task. Pt with no report of back pain during self-care tasks, however, required rest breaks d/t "pressure" behind R eye. Provided gaze stabilization exercises with handout and briefly completed to ensure understanding. Exercises progressed from being completed in sitting to standing. Pt left sitting in recliner chair with worker's comp case Financial controllerworker.   Session 2: Pt seen for 1:1 OT session with focus on visual scanning, VOR activities, balance, and gaze stabilization exercises. Pt received sitting in recliner chair agreeable to therapy, however, reporting not "feeling well." Pt with increased pressure in right eye and minimal diplopia noted. Engaged in standing tasks with use of horsehoes, reaching in vertical and diagonal directions with focus on visual scanning. No diplopia noted. [facility] and pt able to self-correct. Minimal VOR exercises completed in standing d/t increased pressure and  feeling "groggy" (likely from pain meds). Pt returned to room and allowed rest break with eye patch on. Upon return to room, completed VOR exercises in sitting with vertical and horizontal movements. Pt voicing concern of increased pressure today. Educated on R eye fatigue today as he has been wearing patch much less. Pt left sitting in recliner chair with all needs in reach.    Therapy Documentation Precautions:  Precautions Precautions: Fall Precaution Comments: vertigo symptoms with increased head movement Restrictions Weight Bearing Restrictions: No General:   Vital Signs:   Pain: No report of pain during therapy session.   See FIM for current functional status  Therapy/Group: Individual Therapy  Daneil Danerkinson, Mauricio Dahlen N 12/14/2013, 9:53 AM

## 2013-12-14 NOTE — Progress Notes (Signed)
Physical Therapy Note  Patient Details  Name: Michael Hooper MRN: [id number] Date of Birth: 05-04-1960 Today's Date: 12/14/2013  Attempted to make up missed time with patient from time missed during SLP session (5 min) and OT session (10 min). Patient reporting he is "feeling very fatigued", citing R eye patch off all day and combination of pain medicine and anti-anxiety medicine as possible causes. Will follow up as able.  Zella RicherBridget S Candy Ziegler S. Laine Giovanetti, PT, DPT 12/14/2013, 2:30 PM

## 2013-12-15 ENCOUNTER — Inpatient Hospital Stay (HOSPITAL_COMMUNITY): Payer: Worker's Compensation | Admitting: *Deleted

## 2013-12-15 ENCOUNTER — Inpatient Hospital Stay (HOSPITAL_COMMUNITY): Payer: Worker's Compensation | Admitting: Occupational Therapy

## 2013-12-15 ENCOUNTER — Inpatient Hospital Stay (HOSPITAL_COMMUNITY): Payer: Worker's Compensation

## 2013-12-15 DIAGNOSIS — S0210XS Unspecified fracture of base of skull, sequela: Secondary | ICD-10-CM

## 2013-12-15 DIAGNOSIS — S069X1S Unspecified intracranial injury with loss of consciousness of 30 minutes or less, sequela: Secondary | ICD-10-CM

## 2013-12-15 LAB — GLUCOSE, CAPILLARY
GLUCOSE-CAPILLARY: 125 mg/dL — AB (ref 70–99)
Glucose-Capillary: 95 mg/dL (ref 70–99)
Glucose-Capillary: 128 mg/dL — ABNORMAL HIGH (ref 70–99)
Glucose-Capillary: 139 mg/dL — ABNORMAL HIGH (ref 70–99)

## 2013-12-15 MED ORDER — POTASSIUM CHLORIDE CRYS ER 20 MEQ PO TBCR: 20.0000 meq | EXTENDED_RELEASE_TABLET | Freq: Every day | ORAL | Status: DC

## 2013-12-15 MED ORDER — ARTIFICIAL TEARS OP OINT: 1.0000 "application " | TOPICAL_OINTMENT | Freq: Every day | OPHTHALMIC | Status: AC

## 2013-12-15 MED ORDER — ALPRAZOLAM 0.5 MG PO TABS: 0.5000 mg | ORAL_TABLET | Freq: Three times a day (TID) | ORAL | Status: DC | PRN

## 2013-12-15 MED ORDER — TOPIRAMATE 50 MG PO TABS: 50.0000 mg | ORAL_TABLET | Freq: Every day | ORAL | Status: DC

## 2013-12-15 MED ORDER — ACETAMINOPHEN 325 MG PO TABS: 650.0000 mg | ORAL_TABLET | Freq: Four times a day (QID) | ORAL | Status: AC | PRN

## 2013-12-15 MED ORDER — POLYVINYL ALCOHOL 1.4 % OP SOLN: 1.0000 [drp] | Freq: Three times a day (TID) | OPHTHALMIC | Status: AC

## 2013-12-15 MED ORDER — OXYCODONE HCL ER 10 MG PO T12A: 10.0000 mg | EXTENDED_RELEASE_TABLET | Freq: Every day | ORAL | Status: DC

## 2013-12-15 MED ORDER — PREDNISONE 20 MG PO TABS: ORAL_TABLET | ORAL | Status: AC

## 2013-12-15 MED ORDER — PANTOPRAZOLE SODIUM 40 MG PO TBEC: 40.0000 mg | DELAYED_RELEASE_TABLET | Freq: Every day | ORAL | Status: DC

## 2013-12-15 MED ORDER — OXYCODONE HCL 10 MG PO TABS: 5.0000 mg | ORAL_TABLET | Freq: Four times a day (QID) | ORAL | Status: DC | PRN

## 2013-12-15 MED ORDER — POLYETHYLENE GLYCOL 3350 17 G PO PACK: 17.0000 g | PACK | Freq: Every day | ORAL | Status: AC

## 2013-12-15 NOTE — Progress Notes (Signed)
Social Work Patient ID: Michael Hooper, male   DOB: 03/01/1960, 53 y.o.   MRN: [id number]   Met with pt yesterday and today to review team conference.  Original targeted d/c date set for 11/21, however, upon reeval with tx, team feels he could d/c tomorrow. Pt and wife very agreeable with this. In process of arranging OP tx f/u.  No DME needs.  Have coordinated all plans with WC CM.  Solangel Mcmanaway, LCSW

## 2013-12-15 NOTE — Patient Care Conference (Signed)
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 Blunt trauma 12/09/2013  . 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 Facial laceration 12/09/2013  . Lumbar transverse process fracture 12/09/2013  . Pneumocephalus, traumatic 12/09/2013  . Traumatic ossicular dislocation 12/09/2013  . Concussion 12/09/2013  . 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 TBI (traumatic brain injury) 12/09/2013  . Skull base fx 12/06/2013    Expected Discharge Date: Expected Discharge Date: 12/16/13  Team Members Present: Physician leading conference: Dr. Faith RogueZachary Swartz Social Worker Present: Amada JupiterLucy Lawren Sexson, LCSW Nurse Present: Kennon PortelaJeanna Hicks, RN PT Present: Cyndia SkeetersBridgett Ripa, Scot JunPT;Caroline King, PT OT Present: Ardis Rowanom Lanier, COTA;Jennifer Fredrich RomansSmith, OT;Kayla Perkinson, OT SLP Present: Feliberto Gottronourtney Payne, SLP PPS Coordinator present : Tora DuckMarie Noel, RN, CRRN     Current Status/Progress Goal Weekly Team Focus  Medical   improved movement, 6 and 7 nerve damage, diizziness and diplopia improving  improved activity tolerance  finalize dc planning   Bowel/Bladder   Pt continent of bowel and bladder.   Manage with Mod I  contiune POC    Swallow/Nutrition/ Hydration   Regular  textures with thin liquids, Mod I  Mod I  At goal level, possible discharge from SLP caseload    ADL's   supervision overall and Mod I UB dressing  Mod I   education, gaze stabilization, VOR exercises, standing balance, and functional mobility   Mobility   Overall supervision>min guard   Mod I  Improve high level balance, safety, vestibular system, functional mobility, education, endurance/activity tolerance   Communication   Mod I  Mod I  Possible discharge from SLP caseload today   Safety/Cognition/ Behavioral Observations  Mod I  Mod I  Possible discharge from SLP caseload today    Pain   back pain and headache with 10oxycodone PRN.   3 or less out of 10  assess pain and need for PRN medications   Skin   laceration to beind right ear- guaze CDI  remain free of skin breakdown and infection  assess skin qshift; change dressing as needed    Rehab Goals Patient on target to meet rehab goals: Yes *See Care Plan and progress notes for long and short-term goals.  Barriers to Discharge: vision, balance    Possible Resolutions to Barriers:  education, adaptive eye wear    Discharge Planning/Teaching Needs:  home with evening assist of wife      Team Discussion:  Doing well overall.  Await MRI.  Nerve damage to right side of face.  Mod i goals.  Tolerating vestibular issues better.  Now with some "pressure" in his eye.  Follow up with neuro opthamologist after d/c.  Revisions to Treatment Plan:  None   Continued Need for Acute Rehabilitation Level  of Care: The patient requires daily medical management by a physician with specialized training in physical medicine and rehabilitation for the following conditions: Daily direction of a multidisciplinary physical rehabilitation program to ensure safe treatment while eliciting the highest outcome that is of practical value to the patient.: Yes Daily medical management of patient stability for increased activity during participation in an  intensive rehabilitation regime.: Yes Daily analysis of laboratory values and/or radiology reports with any subsequent need for medication adjustment of medical intervention for : Post surgical problems;Neurological problems;Pulmonary problems  Michael Hooper 12/15/2013, 3:19 PM

## 2013-12-15 NOTE — Progress Notes (Signed)
Occupational Therapy Session Note  Patient Details  Name: Michael GreeningJames Dannenberg MRN: [id number] Date of Birth: 1960-11-04  Today's Date: 12/15/2013 OT Individual Time: [id number] and 1300-1400 OT Individual Time Calculation (min): 50 min  and 60 min  Today's Date: 12/15/2013 OT Missed Time: 10 Minutes Missed Time Reason: Other (comment) (eating breakfast)   Short Term Goals: Week 1:  OT Short Term Goal 1 (Week 1): STGs=LTGs  Skilled Therapeutic Interventions/Progress Updates:    Session 1: Pt seen for ADL retraining with focus on functional mobility, visual scanning, gaze stabilization, and activity tolerance. Pt received sitting EOB eating breakfast (missing 10 min). Pt completed bathing and dressing at mod I level. Pt reporting increased pressure on R eye this AM, therefore, wearing patch throughout session. Re-educated on wearing schedule of patch at home and in hospital. Engaged in higher level balance activities with emphasis on VOR exercises during functional mobility. Pt completed ball toss while ambulating forward, backward, and side stepping with focus on head movements in all directions. Pt with minimal vertigo noted. Completed obstacle course of stepping over items and on BOSU ball to challenge balance. Completed VOR x1 exercises in sitting with eye patch off with pt tolerating up to 15 seconds before requiring rest break. Pt returned to room and left with all needs in reach.   Session 2: Pt seen for 1:1 OT session with focus on functional mobility, activity tolerance, VOR activities, and hand eye coordiniton. Completed IADL tasks of fixing light switch cover with use of screwdriver and managing flashlight batteries. Pt knelt on floor to protect back while using screwdriver to apply light cover with increased time and no cues. Pt with no difficulty problem solving to place batteries into flashlight as well. Engaged in high level balance activities while standing on foam with narrow and wide BOS  and ambulating in hallway while tossing/catching ball. Pt reporting no dizziness during activity when wearing eye patch, and minimal dizziness noted without eye patch. Ambulated outside on concrete and grass at mod I level with slight LOB 1x and pt self-correcting. Pt ambulated up 20 steps with R hand rail at mod I level. Pt returned to room and left with all needs in reach. No questions or concerns about discharge at this time.  Therapy Documentation Precautions:  Precautions Precautions: Fall Precaution Comments: vertigo symptoms with increased head movement Restrictions Weight Bearing Restrictions: No General: General OT Amount of Missed Time: 10 Minutes Vital Signs:   Pain: Pain Assessment Pain Assessment: 0-10 Pain Score: 3  Pain Type: Acute pain Pain Location: Head Pain Intervention(s): Medication (See eMAR)  See FIM for current functional status  Therapy/Group: Individual Therapy  Daneil Danerkinson, Davarius Ridener N 12/15/2013, 12:13 PM

## 2013-12-15 NOTE — Plan of Care (Signed)
Problem: RH BOWEL ELIMINATION Goal: RH STG MANAGE BOWEL WITH ASSISTANCE STG Manage Bowel with min Assistance.  Outcome: Completed/Met Date Met:  12/15/13

## 2013-12-15 NOTE — Plan of Care (Signed)
Problem: RH BOWEL ELIMINATION Goal: RH STG MANAGE BOWEL W/MEDICATION W/ASSISTANCE STG Manage Bowel with Medication with min Assistance.  Outcome: Completed/Met Date Met:  12/15/13

## 2013-12-15 NOTE — Plan of Care (Signed)
Problem: RH SAFETY Goal: RH STG DECREASED RISK OF FALL WITH ASSISTANCE STG Decreased Risk of Fall With supervision Assistance.  Outcome: Progressing

## 2013-12-15 NOTE — Progress Notes (Signed)
PHYSICAL MEDICINE & REHABILITATION     PROGRESS NOTE    Subjective/Complaints: No complaints. Back pain tolerated. Wearing eye patch. Eye feels "full" when he performs activities without patch.    Review of Systems - Negative except as above Objective: Vital Signs: Blood pressure 140/84, pulse 94, temperature 97.8 F (36.6 C), temperature source Oral, resp. rate 18, SpO2 97 %. No results found. No results for input(s): WBC, HGB, HCT, PLT in the last 72 hours. No results for input(s): NA, K, CL, GLUCOSE, BUN, CREATININE, CALCIUM in the last 72 hours.  Invalid input(s): CO CBG (last 3)   Recent Labs  12/13/13 1841 12/14/13 1907 12/15/13 0638  GLUCAP 149* 152* 95    Wt Readings from Last 3 Encounters:  12/06/13 114.7 kg (252 lb 13.9 oz)    Physical Exam:  HENT: oral mucosa pink and moist Head: Normocephalic.  Right ear with surgical dressing. Sutures intact Eyes: Pupils are equal, round, and reactive to light. Right conjunctiva is injected somewhat, large amounts of ointment in and around eye Laceration on left lid with sutures in place.  Neck: Normal range of motion. Neck supple.  Respiratory: Effort normal and breath sounds normal.  GI: Soft. Bowel sounds are normal. He exhibits no distension. There is no tenderness.  Musculoskeletal: He exhibits no edema or tenderness.  Low back pain with SLR on right.  Neurological: He is alert and oriented to person, place, and time.  Speech clear. HOH. Unable to move right eye laterally with complaints of diplopia in right lateral field. Right facial paresis. Cannot close right eye lid completely.  Follows commands without difficulty. fair insight and awareness. UE: 4+/5 prox to distal. LE's 4 to 4+/5 prox to distal. No sensory findings. dtr's 1+ Skin: Skin is warm and dry.  Large ecchymotic area on mid lower back persistent     Assessment/Plan: 1. Functional deficits secondary to TBI with temporal skull  fracture/polytrauma which require 3+ hours per day of interdisciplinary therapy in a comprehensive inpatient rehab setting. Physiatrist is providing close team supervision and 24 hour management of active medical problems listed below. Physiatrist and rehab team continue to assess barriers to discharge/monitor patient progress toward functional and medical goals.  Pt without new issues. [facility] Pt is doing very well, however and nearly ready for dc home.   FIM: FIM - Bathing Bathing Steps Patient Completed: Chest, Right Arm, Left Arm, Abdomen, Front perineal area, Buttocks, Right upper leg, Left upper leg, Right lower leg (including foot), Left lower leg (including foot) Bathing: 6: More than reasonable amount of time  FIM - Upper Body Dressing/Undressing Upper body dressing/undressing steps patient completed: Thread/unthread right sleeve of pullover shirt/dresss, Pull shirt over trunk, Thread/unthread left sleeve of pullover shirt/dress, Put head through opening of pull over shirt/dress Upper body dressing/undressing: 6: More than reasonable amount of time FIM - Lower Body Dressing/Undressing Lower body dressing/undressing steps patient completed: Thread/unthread right underwear leg, Fasten/unfasten pants, Thread/unthread left underwear leg, Pull underwear up/down, Thread/unthread right pants leg, Thread/unthread left pants leg, Pull pants up/down, Don/Doff left sock, Don/Doff right sock, Don/Doff right shoe, Don/Doff left shoe Lower body dressing/undressing: 5: Supervision: Safety issues/verbal cues  FIM - Toileting Toileting: 0: Activity did not occur  FIM - Diplomatic Services operational officerToilet Transfers Toilet Transfers Assistive Devices: Art gallery managerWalker Toilet Transfers: 0-Activity did not occur  FIM - BankerBed/Chair Transfer Bed/Chair Transfer Assistive Devices: Arm rests Bed/Chair Transfer: 6: Chair or W/C > Bed: No assist, 6: Bed > Chair or  W/C: No assist, 6: Sit > Supine: No assist, 6:  Supine > Sit: No assist, 6: Assistive device: no helper, 6: More than reasonable amt of time  FIM - Locomotion: Wheelchair Distance: 150 Locomotion: Wheelchair: 0: Activity did not occur FIM - Locomotion: Ambulation Locomotion: Ambulation Assistive Devices: Other (comment) (none) Ambulation/Gait Assistance: 5: Supervision Locomotion: Ambulation: 5: Travels 150 ft or more with supervision/safety issues  Comprehension Comprehension Mode: Auditory Comprehension: 6-Follows complex conversation/direction: With extra time/assistive device  Expression Expression Mode: Verbal Expression: 6-Expresses complex ideas: With extra time/assistive device  Social Interaction Social Interaction: 6-Interacts appropriately with others with medication or extra time (anti-anxiety, antidepressant).  Problem Solving Problem Solving: 6-Solves complex problems: With extra time  Memory Memory: 6-More than reasonable amt of time Medical Problem List and Plan: 1. 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 DVT Prophylaxis/Anticoagulation: Mechanical: Sequential compression devices, below knee Bilateral lower extremities 3. Pain Management: Continue oxycodone prn.  4. Mood: LCSW to follow for evaluation and support.  5. Neuropsych: This patient is capable of making decisions on his own behalf. 6. Skin/Wound Care: Routine pressure relief measures. Monitor wound for healing.  7. Fluids/Electrolytes/Nutrition: Monitor I/O. Advance to regular diet today 8. Right ossicular disruption: To continue high dose steriods for a week followed by slow taper over 3 weeks per Dr. Lazarus SalinesWolicki,   -hearing impaired but able to communicate with clear, loud speech 9. Leucocytosis: Monitor for signs of infection or fevers. steroids tapering 10. Steroid induced Hyperglycemia: dc cbg checks  11. Basilar skull fracture: CT unchanged.   12.  Right CN VII,VI palsy: Eye drops thorough out the day with patching at bedtime. Patient educated on need for patching/moisture to prevent scleraeal abrasion.  -might do well with swim goggle to cover eye and keep a moist environment?   -outpt optho with Dr. Karleen HampshireSpencer on Friday 13. Headaches: topamax added to help with symptoms.--has helped  14. Back pain: consider additional imaging of lumbar spine if pain worsens or neuro changes  -Right L1-4 TP fx'es---stable. Intramuscular hematoma in low back musculature  - no signs of myelopathy on exam   -MRI pending for today 15. Elevated BP this am monitor , no hx HTN 16. Plastics: removed sutures from right ear yesterday. A few need to be clipped from left brow as well   LOS (Days) 6 A FACE TO FACE EVALUATION WAS PERFORMED  Elaya Droege T 12/15/2013 8:06 AM

## 2013-12-15 NOTE — Plan of Care (Signed)
Problem: RH SAFETY Goal: RH STG ADHERE TO SAFETY PRECAUTIONS W/ASSISTANCE/DEVICE STG Adhere to Safety Precautions With supervision Assistance/Device.  Outcome: Progressing     

## 2013-12-15 NOTE — Progress Notes (Signed)
Occupational Therapy Note  Patient Details  Name: Michael GreeningJames Hooper MRN: [id number] Date of Birth: 08-09-1960   OT attempted missed minutes from today's sessions. However, RN present in room to start IV and therapist unable to see pt for OT intervention.    Lowella Gripittman, Akaylah Lalley L 12/15/2013, 3:37 PM

## 2013-12-15 NOTE — Progress Notes (Signed)
Occupational Therapy Session Note  Patient Details  Name: Michael Hooper MRN: [id number] Date of Birth: [birth year]  Today's Date: 12/15/2013 OT Individual Time: 0900-1000 OT Individual Time Calculation (min): 60 min   Short Term Goals: Week 1:  OT Short Term Goal 1 (Week 1): STGs=LTGs  Skilled Therapeutic Interventions/Progress Updates:  Patient resting in recliner with lights off and eye patch over right eye.  Engaged in functional mobility during IADL tasks, activity tolerance, simple kitchen tasks, gentle stretching of lower back, eye hand coordination and visual exercises during tabletop activity in sitting and in standing.  Patient with no LOB with all above tasks, had difficulty with problem solving related to best position to protect his back when leaning down to open cabinet door with a key and with problem solving how to use the key.  Patient declined to prepare much in the therapy kitchen however, he did agree to prepare toast with butter on it.  Patient stating that his stomach was feeling a little queasy "probably because of all of the medicine I am taking".  When trying to reach a target using RUE then LUE, patient often moving slowly and overshooting or undershooting.  Patient practiced compensating by using his sense of feeling with his fingers to improve accuracy.  Upon reflection of the session, patient agreed that he was surprised that he had difficulty unlocking the cabinet door with a key, especially when he realized how easy the task was once her just turned the key over.    Therapy Documentation Precautions:  Precautions Precautions: Fall Precaution Comments: vertigo symptoms with increased head movement Restrictions Weight Bearing Restrictions: No Pain: 4/10 lower back before therapy and 5/10 after therapy.  Rest, repositioned, gentle stretching, RN notified and medication provided.  Therapy/Group: Individual Therapy  [name] 12/15/2013, 11:26 AM

## 2013-12-15 NOTE — Progress Notes (Signed)
Physical Therapy Session Note  Patient Details  Name: Michael Hooper MRN: [id number] Date of Birth: [birth year]  Today's Date: 12/15/2013 PT Individual Time: 1100-1130 PT Individual Time Calculation (min): 30 min   Short Term Goals: Week 1:  PT Short Term Goal 1 (Week 1): STGs=LTGs secondary to ELOS and CLOF  Skilled Therapeutic Interventions/Progress Updates:  1:1. Pt received sitting in recliner, ready for therapy. Focus this session on safety during functional transfers and mobility as well as d/c planning. Pt able to demonstrate car t/f with supervision. At [facility] level, multiple bouts of ambulation >300' in controlled, home and community environments, negotiation up/down 12 steps with single R rail (reciprocal pattern), up/down 4 steps w/out railing (ascending reciprocally and descending step-to), floor transfer. Pt educated on d/c process, reviewed home safety as well as goals of OP PT. Pt verbalized understanding.   Therapy Documentation Precautions:  Precautions Precautions: Fall Precaution Comments: vertigo symptoms with increased head movement Restrictions Weight Bearing Restrictions: No   Pain: Pain Assessment Pain Assessment: 0-10 Pain Score: 5  Pain Type: Acute pain Pain Location: Head Pain Intervention(s): Medication (See eMAR)  See FIM for current functional status  Therapy/Group: Individual Therapy  Denzil HughesKing, Chad Tiznado S 12/15/2013, 11:32 AM

## 2013-12-15 NOTE — Plan of Care (Signed)
Problem: RH SKIN INTEGRITY Goal: RH STG ABLE TO PERFORM INCISION/WOUND CARE W/ASSISTANCE STG Able To Perform Incision/Wound Care With Total Assistance from caregiver.  Outcome: Not Applicable Date Met:  44/69/50 Dressing in place to back of r ear and inside r ear, r ear w/ sutures

## 2013-12-15 NOTE — Progress Notes (Signed)
Physical Therapy Discharge Summary  Patient Details  Name: Michael Hooper MRN: [id number] Date of Birth: July 09, 1960  Today's Date: 12/15/2013 PT Individual Time: 1400-1500 PT Individual Time Calculation (min): 60 min    Patient has met 11 of 11 long term goals due to improved activity tolerance, improved balance, improved postural control, increased strength, ability to compensate for deficits, improved attention, improved awareness and improved coordination.  Patient to discharge at an ambulatory level Modified Independent. Formal family training not completed as pt is able to direct care as needed. Patient's wife is independent to provide the necessary physical assistance at discharge.  Reasons goals not met: All goals met.  Recommendation:  Patient will benefit from ongoing skilled PT services in outpatient setting to continue to advance safe functional mobility, address ongoing impairments in balance, vestibular dysfunction, and minimize fall risk.  Equipment: No equipment provided  Reasons for discharge: treatment goals met and discharge from hospital  Patient/family agrees with progress made and goals achieved: Yes   Skilled intervention: Pt was assisted to rehab gym by [employer]. Session focused on formal assessment of balance and mobility, and review of pt's progression towards LTGs. Pt performed TUG, BERG, and DGI; see further details below. In comparison to initial performance of standardized tests pt's displays significant improvement; pt missed points on BERG and DGI due to continued mild balance deficits and vestibular dysfunction. Progress reviewed with pt and education provided for goals regarding outpatient PT; pt verbalized understanding of all education provided. Pt was given HEP to help maintain gains in balance and LE strength. Pt was left in room sitting up in recliner with all needs in reach.   PT Discharge Precautions/Restrictions Precautions Precautions:  Fall Precaution Comments: Due to vision and vestibular deficits Restrictions Weight Bearing Restrictions: No Vital Signs Therapy Vitals Temp: 98.4 F (36.9 C) Pulse Rate: 82 BP: 117/83 mmHg Oxygen Therapy SpO2: 93 % O2 Device: Not Delivered Pain Pain Assessment Pain Assessment: 0-10 Pain Score: 2  Pain Type: Acute pain Pain Location: Back Pain Orientation: Lower Pain Descriptors / Indicators: Sore;Tightness Vision/Perception  See OT d/c Vision - Assessment Eye Alignment: Impaired (comment) (right eye adducted) Ocular Range of Motion: Restricted on the right Alignment/Gaze Preference: Head turned Tracking/Visual Pursuits: Right eye does not track laterally;Decreased smoothness of eye movement to RIGHT superior field;Decreased smoothness of eye movement to RIGHT inferior field;Decreased smoothness of horizontal tracking Convergence: Other (comment) (diplopia decreased with convergence closer to nose) Additional Comments: using eye patch; during therapy pt increasing toleration with glasses and taping on right   Cognition Overall Cognitive Status: Within Functional Limits for tasks assessed Arousal/Alertness: Awake/alert Orientation Level: Oriented X4 Attention: Selective Sustained Attention: Appears intact Selective Attention: Appears intact Memory: Appears intact Awareness: Appears intact Problem Solving: Appears intact Safety/Judgment: Appears intact [employer] Scales of Cognitive Functioning: Purposeful/appropriate Sensation Sensation Light Touch: Appears Intact Hot/Cold: Appears Intact Proprioception: Appears Intact Coordination Gross Motor Movements are Fluid and Coordinated: Yes Fine Motor Movements are Fluid and Coordinated: Yes Motor  Motor Motor: Within Functional Limits  Mobility Bed Mobility Bed Mobility: Supine to Sit;Sit to Supine Supine to Sit: 6: Modified independent (Device/Increase time) Sit to Supine: 6: Modified independent  (Device/Increase time);HOB elevated Transfers Transfers: Yes Sit to Stand: 6: Modified independent (Device/Increase time) Stand to Sit: 6: Modified independent (Device/Increase time) Stand Pivot Transfers: 6: Modified independent (Device/Increase time) Locomotion  Ambulation Ambulation: Yes Ambulation/Gait Assistance: 6: Modified independent (Device/Increase time) Ambulation Distance (Feet): 150 Feet Assistive device: None Gait Gait: Yes Gait  Pattern: Within Functional Limits Gait Pattern: Within Functional Limits High Level Ambulation High Level Ambulation: Head turns;Sudden stops Stairs / Additional Locomotion Stairs: Yes Stairs Assistance: 6: Modified independent (Device/Increase time) Stair Management Technique: No rails Number of Stairs: 4 Wheelchair Mobility Wheelchair Mobility: No (pt mod I with functional ambulation)  Trunk/Postural Assessment  Cervical Assessment Cervical Assessment: Exceptions to [facility] (decreased due to vertigo however much improvement from intial eval) Thoracic Assessment Thoracic Assessment: Within Functional Limits Lumbar Assessment Lumbar Assessment: Within Functional Limits Postural Control Postural Control: Within Functional Limits  Balance Balance Balance Assessed: Yes Standardized Balance Assessment Standardized Balance Assessment: Dynamic Gait Index Berg Balance Test Sit to Stand: Able to stand without using hands and stabilize independently Standing Unsupported: Able to stand safely 2 minutes Sitting with Back Unsupported but Feet Supported on Floor or Stool: Able to sit safely and securely 2 minutes Stand to Sit: Sits safely with minimal use of hands Transfers: Able to transfer safely, minor use of hands Standing Unsupported with Eyes Closed: Able to stand 10 seconds safely Standing Ubsupported with Feet Together: Able to place feet together independently and stand for 1 minute with supervision From Standing, Reach Forward with  Outstretched Arm: Can reach confidently >25 cm (10") From Standing Position, Pick up Object from Floor: Able to pick up shoe, needs supervision From Standing Position, Turn to Look Behind Over each Shoulder: Turn sideways only but maintains balance Turn 360 Degrees: Able to turn 360 degrees safely in 4 seconds or less Standing Unsupported, Alternately Place Feet on Step/Stool: Able to stand independently and safely and complete 8 steps in 20 seconds Standing Unsupported, One Foot in Front: Able to place foot tandem independently and hold 30 seconds Standing on One Leg: Able to lift leg independently and hold equal to or more than 3 seconds Total Score: 50/56 indicating low risk for falls, with mild balance deficits remaining  Dynamic Gait Index Level Surface: Normal Change in Gait Speed: Normal Gait with Horizontal Head Turns: Mild Impairment Gait with Vertical Head Turns: Mild Impairment Gait and Pivot Turn: Normal Step Over Obstacle: Normal Step Around Obstacles: Normal Steps: Normal Total Score: 22 Timed Up and Go Test TUG: Normal TUG Normal TUG (seconds): 7 Static Standing Balance Static Standing - Balance Support: During functional activity;No upper extremity supported Static Standing - Level of Assistance: 6: Modified independent (Device/Increase time) Dynamic Standing Balance Dynamic Standing - Balance Support: No upper extremity supported;During functional activity Dynamic Standing - Level of Assistance: 6: Modified independent (Device/Increase time) Extremity Assessment  RUE Assessment RUE Assessment: Within Functional Limits LUE Assessment LUE Assessment: Within Functional Limits RLE Assessment RLE Assessment: Within Functional Limits LLE Assessment LLE Assessment: Within Functional Limits  See FIM for current functional status  Laelle Bridgett 12/15/2013, 6:02 PM

## 2013-12-15 NOTE — Plan of Care (Signed)
Problem: RH PAIN MANAGEMENT Goal: RH STG PAIN MANAGED AT OR BELOW PT'S PAIN GOAL <4  Outcome: Progressing     

## 2013-12-15 NOTE — Plan of Care (Signed)
Problem: RH Balance Goal: LTG Patient will maintain dynamic standing with ADLs (OT) LTG: Patient will maintain dynamic standing balance with assist during activities of daily living (OT)  Outcome: Completed/Met Date Met:  12/15/13  Problem: RH Grooming Goal: LTG Patient will perform grooming w/assist,cues/equip (OT) LTG: Patient will perform grooming with assist, with/without cues using equipment (OT)  Outcome: Completed/Met Date Met:  12/15/13  Problem: RH Bathing Goal: LTG Patient will bathe with assist, cues/equipment (OT) LTG: Patient will bathe specified number of body parts with assist with/without cues using equipment (position) (OT)  Outcome: Completed/Met Date Met:  12/15/13  Problem: RH Dressing Goal: LTG Patient will perform upper body dressing (OT) LTG Patient will perform upper body dressing with assist, with/without cues (OT).  Outcome: Completed/Met Date Met:  12/15/13 Goal: LTG Patient will perform lower body dressing w/assist (OT) LTG: Patient will perform lower body dressing with assist, with/without cues in positioning using equipment (OT)  Outcome: Completed/Met Date Met:  12/15/13  Problem: RH Toileting Goal: LTG Patient will perform toileting w/assist, cues/equip (OT) LTG: Patient will perform toiletiing (clothes management/hygiene) with assist, with/without cues using equipment (OT)  Outcome: Completed/Met Date Met:  12/15/13  Problem: RH Vision Goal: RH LTG Vision (Specify) Outcome: Completed/Met Date Met:  12/15/13  Problem: RH Simple Meal Prep Goal: LTG Patient will perform simple meal prep w/assist (OT) LTG: Patient will perform simple meal prep with assistance, with/without cues (OT).  Outcome: Completed/Met Date Met:  12/15/13  Problem: RH Toilet Transfers Goal: LTG Patient will perform toilet transfers w/assist (OT) LTG: Patient will perform toilet transfers with assist, with/without cues using equipment (OT)  Outcome: Completed/Met Date Met:   12/15/13  Problem: RH Tub/Shower Transfers Goal: LTG Patient will perform tub/shower transfers w/assist (OT) LTG: Patient will perform tub/shower transfers with assist, with/without cues using equipment (OT)  Outcome: Completed/Met Date Met:  12/15/13

## 2013-12-15 NOTE — Plan of Care (Signed)
Problem: RH Balance Goal: LTG Patient will maintain dynamic sitting balance (PT) LTG: Patient will maintain dynamic sitting balance with assistance during mobility activities (PT)  Outcome: Completed/Met Date Met:  12/15/13 Goal: LTG Patient will maintain dynamic standing balance (PT) LTG: Patient will maintain dynamic standing balance with assistance during mobility activities (PT)  Outcome: Completed/Met Date Met:  12/15/13  Problem: RH Bed Mobility Goal: LTG Patient will perform bed mobility with assist (PT) LTG: Patient will perform bed mobility with assistance, with/without cues (PT).  Outcome: Completed/Met Date Met:  12/15/13  Problem: RH Bed to Chair Transfers Goal: LTG Patient will perform bed/chair transfers w/assist (PT) LTG: Patient will perform bed/chair transfers with assistance, with/without cues (PT).  Outcome: Completed/Met Date Met:  12/15/13  Problem: RH Car Transfers Goal: LTG Patient will perform car transfers with assist (PT) LTG: Patient will perform car transfers with assistance (PT).  Outcome: Completed/Met Date Met:  12/15/13  Problem: RH Furniture Transfers Goal: LTG Patient will perform furniture transfers w/assist (OT/PT LTG: Patient will perform furniture transfers with assistance (OT/PT).  Outcome: Completed/Met Date Met:  12/15/13  Problem: RH Floor Transfers Goal: LTG Patient will perform floor transfers w/assist (PT) LTG: Patient will perform floor transfers with assistance (PT).  Outcome: Completed/Met Date Met:  12/15/13  Problem: RH Ambulation Goal: LTG Patient will ambulate in controlled environment (PT) LTG: Patient will ambulate in a controlled environment, # of feet with assistance (PT).  Outcome: Completed/Met Date Met:  12/15/13 Goal: LTG Patient will ambulate in home environment (PT) LTG: Patient will ambulate in home environment, # of feet with assistance (PT).  Outcome: Completed/Met Date Met:  12/15/13 Goal: LTG Patient will  ambulate in community environment (PT) LTG: Patient will ambulate in community environment, # of feet with assistance (PT).  Outcome: Completed/Met Date Met:  12/15/13  Problem: RH Stairs Goal: LTG Patient will ambulate up and down stairs w/assist (PT) LTG: Patient will ambulate up and down # of stairs with assistance (PT)  Outcome: Completed/Met Date Met:  12/15/13

## 2013-12-15 NOTE — Progress Notes (Signed)
Recreational Therapy Session Note  Patient Details  Name: Michael Hooper MRN: [id number] Date of Birth: 08-Sep-1960 Today's Date: 12/15/2013  Informed by team with anticipated LOS as 11/21, therefore no formal TR eval & treatment plan implemented. [name] 12/15/2013, 4:34 PM

## 2013-12-15 NOTE — Progress Notes (Signed)
Occupational Therapy Discharge Summary  Patient Details  Name: Michael Hooper MRN: [id number] Date of Birth: August 15, 1960  12/15/2013   Patient has met 10 of 10 long term goals due to improved activity tolerance, improved balance, postural control, ability to compensate for deficits, improved awareness and improved coordination.  Patient to discharge at overall Modified Independent level.  Patient's care partner is independent and not necessary to provide the necessary physical and cognitive assistance at discharge as patient discharging at Modified Independent level.    Reasons goals not met: N/A. All LTGs met.   Recommendation:  Follow-up OT to be determine following opthalmology appointment. Patient may benefit from outpatient neuro OT to focus on visual deficits.   Equipment: No equipment provided  Reasons for discharge: treatment goals met and discharge from hospital  Patient/family agrees with progress made and goals achieved: Yes  OT Discharge Precautions/Restrictions  Precautions Precautions: Fall Precaution Comments: vertigo symptoms with increased head movement Restrictions Weight Bearing Restrictions: No General OT Amount of Missed Time: 10 Minutes Vital Signs   Pain Pain Assessment Pain Assessment: 0-10 Pain Score: 2  Pain Type: Acute pain Pain Location: Back ADL   Vision/Perception  Vision- History Baseline Vision/History: Wears glasses Wears Glasses: At all times Patient Visual Report: Diplopia Vision- Assessment Vision Assessment?: Yes Eye Alignment: Impaired (comment) (right eye adducted) Ocular Range of Motion: Restricted on the right Alignment/Gaze Preference: Head turned Tracking/Visual Pursuits: Right eye does not track laterally;Decreased smoothness of eye movement to RIGHT superior field;Decreased smoothness of eye movement to RIGHT inferior field;Decreased smoothness of horizontal tracking Convergence: Other (comment) (diplopia decreased with  convergence closer to nose) Visual Fields: No apparent deficits Depth Perception: Undershoots (occasionally) Additional Comments: using eye patch; during therapy pt increasing toleration with glasses and taping on right   Cognition Overall Cognitive Status: Within Functional Limits for tasks assessed Arousal/Alertness: Awake/alert Orientation Level: Oriented X4 Attention: Selective Sustained Attention: Appears intact Selective Attention: Appears intact Memory: Appears intact Awareness: Appears intact Problem Solving: Appears intact Safety/Judgment: Appears intact [employer] Scales of Cognitive Functioning: Purposeful/appropriate Sensation Sensation Light Touch: Appears Intact Hot/Cold: Appears Intact Proprioception: Appears Intact Coordination Gross Motor Movements are Fluid and Coordinated: Yes Fine Motor Movements are Fluid and Coordinated: Yes Motor  Motor Motor: Within Functional Limits Mobility  Bed Mobility Supine to Sit: 6: Modified independent (Device/Increase time) Sit to Supine: 6: Modified independent (Device/Increase time) Transfers Transfers: Stand to Sit;Sit to Stand Sit to Stand: 6: Modified independent (Device/Increase time) Stand to Sit: 6: Modified independent (Device/Increase time)  Trunk/Postural Assessment  Cervical Assessment Cervical Assessment: Exceptions to [facility] (head movements limited by vertigo however much improvement) Thoracic Assessment Thoracic Assessment: Within Functional Limits Lumbar Assessment Lumbar Assessment: Within Functional Limits Postural Control Postural Control: Within Functional Limits  Balance Balance Balance Assessed: Yes Standardized Balance Assessment Standardized Balance Assessment: Dynamic Gait Index Berg Balance Test Sit to Stand: Able to stand without using hands and stabilize independently Standing Unsupported: Able to stand safely 2 minutes Sitting with Back Unsupported but Feet Supported on Floor or  Stool: Able to sit safely and securely 2 minutes Stand to Sit: Sits safely with minimal use of hands Transfers: Able to transfer safely, minor use of hands Standing Unsupported with Eyes Closed: Able to stand 10 seconds safely Standing Ubsupported with Feet Together: Able to place feet together independently and stand for 1 minute with supervision From Standing, Reach Forward with Outstretched Arm: Can reach confidently >25 cm (10") From Standing Position, Pick up Object from Floor: Able  to pick up shoe, needs supervision From Standing Position, Turn to Look Behind Over each Shoulder: Turn sideways only but maintains balance Turn 360 Degrees: Able to turn 360 degrees safely in 4 seconds or less Standing Unsupported, Alternately Place Feet on Step/Stool: Able to stand independently and safely and complete 8 steps in 20 seconds Standing Unsupported, One Foot in Front: Able to place foot tandem independently and hold 30 seconds Standing on One Leg: Able to lift leg independently and hold equal to or more than 3 seconds Total Score: 50 Dynamic Gait Index Level Surface: Normal Change in Gait Speed: Normal Gait with Horizontal Head Turns: Mild Impairment Gait with Vertical Head Turns: Mild Impairment Gait and Pivot Turn: Normal Step Over Obstacle: Normal Step Around Obstacles: Normal Steps: Normal Total Score: 22 Timed Up and Go Test TUG: Normal TUG Normal TUG (seconds): 7.1 Static Standing Balance Static Standing - Balance Support: During functional activity;No upper extremity supported Static Standing - Level of Assistance: 6: Modified independent (Device/Increase time) Dynamic Standing Balance Dynamic Standing - Balance Support: No upper extremity supported;During functional activity Dynamic Standing - Level of Assistance: 6: Modified independent (Device/Increase time) Extremity/Trunk Assessment RUE Assessment RUE Assessment: Within Functional Limits LUE Assessment LUE Assessment:  Within Functional Limits  See FIM for current functional status  Michael Hooper, Michael Hooper 12/15/2013, 3:18 PM

## 2013-12-15 NOTE — Plan of Care (Signed)
Problem: RH BOWEL ELIMINATION Goal: RH STG MANAGE BOWEL WITH ASSISTANCE STG Manage Bowel with min Assistance.  Outcome: Progressing     

## 2013-12-16 ENCOUNTER — Inpatient Hospital Stay (HOSPITAL_COMMUNITY): Payer: Worker's Compensation

## 2013-12-16 DIAGNOSIS — S0219XS Other fracture of base of skull, sequela: Secondary | ICD-10-CM

## 2013-12-16 DIAGNOSIS — H7421 Discontinuity and dislocation of right ear ossicles: Secondary | ICD-10-CM

## 2013-12-16 DIAGNOSIS — S32008S Other fracture of unspecified lumbar vertebra, sequela: Secondary | ICD-10-CM

## 2013-12-16 DIAGNOSIS — H4921 Sixth [abducent] nerve palsy, right eye: Secondary | ICD-10-CM

## 2013-12-16 DIAGNOSIS — G510 Bell's palsy: Secondary | ICD-10-CM

## 2013-12-16 LAB — GLUCOSE, CAPILLARY
Glucose-Capillary: 112 mg/dL — ABNORMAL HIGH (ref 70–99)
Glucose-Capillary: 123 mg/dL — ABNORMAL HIGH (ref 70–99)

## 2013-12-16 MED ORDER — LORAZEPAM 2 MG/ML IJ SOLN
1.0000 mg | Freq: Once | INTRAMUSCULAR | Status: AC
  Administered 2013-12-16: 1 mg via INTRAVENOUS
  Filled 2013-12-16: qty 1

## 2013-12-16 MED ORDER — GADOBENATE DIMEGLUMINE 529 MG/ML IV SOLN
20.0000 mL | Freq: Once | INTRAVENOUS | Status: AC
  Administered 2013-12-16: 20 mL via INTRAVENOUS

## 2013-12-16 NOTE — Progress Notes (Signed)
Pt discharged to home with wife with all belongings.  IV removed.  Discharge instructions reviewed with PA.  Pt in no s/s of distress. Vanice Sarahhompson, Wanda Cellucci L

## 2013-12-16 NOTE — Progress Notes (Signed)
Michael Hooper     PROGRESS NOTE    Subjective/Complaints: Pt feeling well. Excited to go home today.     Review of Systems - Negative except as above Objective: Vital Signs: Blood pressure 132/89, pulse 81, temperature 97.7 F (36.5 C), temperature source Oral, resp. rate 18, weight 108.636 kg (239 lb 8 oz), SpO2 98 %. Michael Hooper  12/16/2013   CLINICAL DATA:  53 year old male with back pain status post work injury, pinned between forklift. Initial encounter.  EXAM: MRI LUMBAR SPINE WITHOUT AND WITH Hooper  TECHNIQUE: Multiplanar and multiecho pulse sequences of the lumbar spine were obtained without and with intravenous Hooper.  Hooper:  20 mL MultiHance.  COMPARISON:  CT Chest Abdomen and Pelvis 12/06/2013.  FINDINGS: Normal lumbar segmentation depicted on comparison. The displaced right side L1 through L4 transverse process fractures are re- identified with associated surrounding enhancement and STIR hyperintensity (series 10 and series 8). Elsewhere normal bone marrow signal in the lumbar spine. No vertebral body marrow edema or injury identified. Trace anterolisthesis of L4 on L5 is associated with disc and facet degeneration described below.  Visualized lower thoracic spinal cord is normal with conus medularis at T12-L1.  Negative visualized abdominal viscera. Diverticulosis of the distal colon re- identified.  Negative posterior paraspinal soft tissues except for the erector spinae muscles in proximity to the fractured right transverse processes.  Normal intervertebral disc spaces T10-T11 through L1-L2.  L2-L3: Disc desiccation and minimal disc bulge.  L3-L4:  Mild facet hypertrophy.  L4-L5: Disc desiccation and disc space loss. Circumferential disc bulge. Mild to moderate facet hypertrophy. Mild ligament flavum hypertrophy. Mild left L4 foraminal stenosis primarily due to broad-based disc. No significant spinal stenosis.  L5-S1:   Negative except for mild facet hypertrophy.  IMPRESSION: 1. Displaced right side L1 through L4 transverse process fractures with associated regional soft tissue inflammation. No other acute osseous abnormality identified in the lumbar spine. 2. Mild lumbar spine degeneration most pronounced at L4-L5. No lumbar spinal stenosis.   Electronically Signed   By: Lars Pinks M.D.   On: 12/16/2013 09:07   No results for input(s): WBC, HGB, HCT, PLT in the last 72 hours. No results for input(s): NA, K, CL, GLUCOSE, BUN, CREATININE, CALCIUM in the last 72 hours.  Invalid input(s): CO CBG (last 3)   Recent Labs  12/15/13 2112 12/16/13 0647 12/16/13 0926  GLUCAP 139* 112* 123*    Wt Readings from Last 3 Encounters:  12/15/13 108.636 kg (239 lb 8 oz)  12/06/13 114.7 kg (252 lb 13.9 oz)    Physical Exam:  HENT: oral mucosa pink and moist Head: Normocephalic.  Right ear with surgical dressing. Sutures intact Eyes: Pupils are equal, round, and reactive to light. Right conjunctiva is injected somewhat, large amounts of ointment in and around eye Laceration on left lid with sutures in place.  Neck: Normal range of motion. Neck supple.  Respiratory: Effort normal and breath sounds normal.  GI: Soft. Bowel sounds are normal. He exhibits no distension. There is no tenderness.  Musculoskeletal: He exhibits no edema or tenderness.  Low back pain with SLR on right.  Neurological: He is alert and oriented to person, place, and time.  Speech clear. HOH. Unable to move right eye laterally with complaints of diplopia in right lateral field. Right facial paresis. Cannot close right eye lid completely.  Follows commands without difficulty. fair insight and awareness. UE: 4+/5 prox to distal. LE's 4  to 4+/5 prox to distal. No sensory findings. dtr's 1+ Skin: Skin is warm and dry.  Large ecchymotic area on mid lower back slowly improving.     Assessment/Plan: 1. Functional deficits secondary to TBI  with temporal skull fracture/polytrauma which require 3+ hours per day of interdisciplinary therapy in a comprehensive inpatient rehab setting. Physiatrist is providing close team supervision and 24 hour management of active medical problems listed below. Physiatrist and rehab team continue to assess barriers to discharge/monitor patient progress toward functional and medical goals.  Home today. outpt follow up  Arranged. Goals met.   FIM: FIM - Bathing Bathing Steps Patient Completed: Chest, Right Arm, Left Arm, Abdomen, Front perineal area, Buttocks, Right upper leg, Left upper leg, Right lower leg (including foot), Left lower leg (including foot) Bathing: 6: More than reasonable amount of time  FIM - Upper Body Dressing/Undressing Upper body dressing/undressing steps patient completed: Thread/unthread right sleeve of pullover shirt/dresss, Pull shirt over trunk, Thread/unthread left sleeve of pullover shirt/dress, Put head through opening of pull over shirt/dress Upper body dressing/undressing: 6: More than reasonable amount of time FIM - Lower Body Dressing/Undressing Lower body dressing/undressing steps patient completed: Thread/unthread right underwear leg, Fasten/unfasten pants, Thread/unthread left underwear leg, Pull underwear up/down, Thread/unthread right pants leg, Thread/unthread left pants leg, Pull pants up/down, Don/Doff left sock, Don/Doff right sock, Don/Doff right shoe, Don/Doff left shoe Lower body dressing/undressing: 5: Supervision: Safety issues/verbal cues  FIM - Toileting Toileting: 0: Activity did not occur  FIM - Radio producer Devices: Insurance account manager Transfers: 0-Activity did not occur  FIM - Control and instrumentation engineer Devices: Arm rests Bed/Chair Transfer: 6: Chair or W/C > Bed: No assist, 6: Bed > Chair or W/C: No assist, 6: Sit > Supine: No assist, 6: Supine > Sit: No assist  FIM - Locomotion:  Wheelchair Distance: 150 Locomotion: Wheelchair: 0: Activity did not occur FIM - Locomotion: Ambulation Locomotion: Ambulation Assistive Devices: Other (comment) (none) Ambulation/Gait Assistance: 6: Modified independent (Device/Increase time) Locomotion: Ambulation: 6: Travels 150 ft or more independently/takes more than reasonable amount of time  Comprehension Comprehension Mode: Auditory Comprehension: 6-Follows complex conversation/direction: With extra time/assistive device  Expression Expression Mode: Verbal Expression: 6-Expresses complex ideas: With extra time/assistive device  Social Interaction Social Interaction: 6-Interacts appropriately with others with medication or extra time (anti-anxiety, antidepressant).  Problem Solving Problem Solving: 6-Solves complex problems: With extra time  Memory Memory: 6-More than reasonable amt of time Medical Problem List and Plan: 1. 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 DVT Prophylaxis/Anticoagulation: Mechanical: Sequential compression devices, below knee Bilateral lower extremities 3. Pain Management: Continue oxycodone prn.  4. Mood: LCSW to follow for evaluation and support.  5. Neuropsych: This patient is capable of making decisions on his own behalf. 6. Skin/Wound Care: Routine pressure relief measures. Monitor wound for healing.  7. Fluids/Electrolytes/Nutrition: Monitor I/O. Advance to regular diet today 8. Right ossicular disruption: To continue high dose steriods for a week followed by slow taper over 3 weeks per Dr. Erik Obey,   -hearing impaired but able to communicate with clear, loud speech 9. Leucocytosis: Monitor for signs of infection or fevers. steroids tapering 10. Steroid induced Hyperglycemia: dc cbg checks  11. Basilar skull fracture: CT unchanged.   12. Right CN VII,VI palsy: Eye drops thorough out the day  with patching at bedtime. Patient educated on need for patching/moisture to prevent scleraeal abrasion.  -might do well  with swim goggle to cover eye and keep a moist environment?   -outpt optho with Dr. Frederico Hamman on Friday 13. Headaches: topamax added to help with symptoms.--has helped  14. Back pain: consider additional imaging of lumbar spine if pain worsens or neuro changes  -Right L1-4 TP fx'es---stable. Intramuscular hematoma in low back musculature  - no signs of myelopathy on exam   -MRI confirms CT---no additional findings 15. Elevated BP this am monitor , no hx HTN 16. Plastics: removed sutures from right ear and brow. Areas look clean/intact   LOS (Days) 7 A FACE TO FACE EVALUATION WAS PERFORMED  SWARTZ,ZACHARY T 12/16/2013 9:51 AM

## 2013-12-16 NOTE — Discharge Instructions (Signed)
Inpatient Rehab Discharge Instructions  Michael GreeningJames Hooper Discharge date and time:  12/16/13  Activities/Precautions/ Functional Status: Activity: no lifting, driving, or strenuous exercise till cleared by MD.  Diet: regular diet Wound Care: keep wound clean and dry   Functional status:  ___ No restrictions     ___ Walk up steps independently ___ 24/7 supervision/assistance   ___ Walk up steps with assistance _X__ Intermittent supervision/assistance  _X__ Bathe/dress independently ___ Walk with walker     ___ Bathe/dress with assistance _X__ Walk Independently    ___ Shower independently ___ Walk with assistance    ___ Shower with assistance _X__ No alcohol     ___ Return to work/school ________     COMMUNITY REFERRALS UPON DISCHARGE:    Outpatient: PT    OT                  Agency:  [facility]    Phone: [phone]               Appointment Date/Time:  12/20/13 @ 9:30 am (arrive 9:00 am) (PT evaluation)                                                                   12/28/13 @ 2:45 - 4:15 pm  (PT treatment and OT evaluation)                                                                                         Special Instructions: 1. No driving. No strenuous activity.  2. Tape right eye shut when sleeping. Use eye drops thorough out the day to keep it lubricated   My questions have been answered and I understand these instructions. I will adhere to these goals and the provided educational materials after my discharge from the hospital.  Patient/Caregiver Signature _______________________________ Date __________  Clinician Signature _______________________________________ Date __________  Please bring this form and your medication list with you to all your follow-up doctor's appointments.

## 2013-12-16 NOTE — Progress Notes (Signed)
Social Work  Discharge Note  The overall goal for the admission was met for:   Discharge location: Yes - home with wife who can provide intermittent assistance  Length of Stay: Yes - 7 days  Discharge activity level: Yes - independent  Home/community participation: Yes  Services provided included: MD, RD, PT, OT, SLP, RN, TR, Pharmacy and [place]: Worker's Comp  Follow-up services arranged: Outpatient: PT and OT via [facility] and Patient/Family has no preference for HH/DME agencies  Comments (or additional information):  Patient/Family verbalized understanding of follow-up arrangements: Yes  Individual responsible for coordination of the follow-up plan: patient  Confirmed correct DME delivered: NA - no DME needed    Lillee Mooneyhan

## 2013-12-20 ENCOUNTER — Ambulatory Visit: Payer: Worker's Compensation | Admitting: Occupational Therapy

## 2013-12-20 ENCOUNTER — Ambulatory Visit: Payer: Worker's Compensation | Attending: Physical Medicine & Rehabilitation

## 2013-12-20 ENCOUNTER — Encounter: Payer: Self-pay | Admitting: Occupational Therapy

## 2013-12-20 DIAGNOSIS — S069X0A Unspecified intracranial injury without loss of consciousness, initial encounter: Secondary | ICD-10-CM

## 2013-12-20 DIAGNOSIS — H547 Unspecified visual loss: Secondary | ICD-10-CM

## 2013-12-20 DIAGNOSIS — M545 Low back pain: Secondary | ICD-10-CM | POA: Diagnosis not present

## 2013-12-20 DIAGNOSIS — T07XXXA Unspecified multiple injuries, initial encounter: Secondary | ICD-10-CM

## 2013-12-20 DIAGNOSIS — R279 Unspecified lack of coordination: Secondary | ICD-10-CM | POA: Insufficient documentation

## 2013-12-20 DIAGNOSIS — H539 Unspecified visual disturbance: Secondary | ICD-10-CM | POA: Diagnosis not present

## 2013-12-20 DIAGNOSIS — R269 Unspecified abnormalities of gait and mobility: Secondary | ICD-10-CM | POA: Insufficient documentation

## 2013-12-20 DIAGNOSIS — Z5189 Encounter for other specified aftercare: Secondary | ICD-10-CM | POA: Insufficient documentation

## 2013-12-20 DIAGNOSIS — S0591XA Unspecified injury of right eye and orbit, initial encounter: Secondary | ICD-10-CM

## 2013-12-20 NOTE — Therapy (Signed)
Physical Therapy Evaluation  Patient Details  Name: Michael Hooper MRN: [id number] Date of Birth: 12-19-1960  Encounter Date: 12/20/2013      PT End of Session - 12/20/13 1758    Visit Number 1   Number of Visits 16   Date for PT Re-Evaluation 02/18/14   Authorization Type Worker's Comp. Case Manager is Fran Lowes cell [phone];    PT Start Time 0930   PT Stop Time 1015   PT Time Calculation (min) 45 min      Past Medical History  Diagnosis Date  . Asthma     No past surgical history on file.  There were no vitals taken for this visit.  Visit Diagnosis:  Lack of coordination - Plan: PT plan of care cert/re-cert  Visual disturbance - Plan: PT plan of care cert/re-cert      Subjective Assessment - 12/20/13 0948    Symptoms Pt reports his balance is fine and his greatest complaint is Right eye visual difficulty. Pt reports double vision, eye blurring, and eye fatigue and sensitivity.   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 He sustained  Open traumatic brain injury with depressed temporal skull fracture, Skull base fx, Facial laceration, Lumbar transverse process fracture, Traumatic ossicular dislocation, TBI (traumatic brain injury), Facial nerve palsy, Sixth cranial nerve disorder   Patient Stated Goals Pt's goal is to improve funciton of the right eye.    Currently in Pain? Other (Comment)  but not currently because he takes tylenol for it   Pain Location Head          [facility] PT Assessment - 12/20/13 0001    Assessment   Medical Diagnosis Open TBI with depressed temporal skull fracture   Onset Date 12/06/13   Prior Therapy PT,OT, ST in inpatient rehab   Precautions   Precaution Comments Lumbar transverse process fracture   Balance Screen   Has the patient fallen in the past 6 months No   Has the patient had a decrease in activity level because of a fear of falling?  Yes   Is the patient reluctant to leave their home  because of a fear of falling?  Yes  pt also reports feeling overstimulated easliy in crowds   [place] Children;Spouse/significant other   Available Help at Discharge Family   Type of [place] Two level   Alternate Level Stairs-Number of Steps 14   Alternate [place] seat - built in   Prior Function   Level of Independence Independent with transfers;Independent with gait;Independent with basic ADLs   Vocation Full time employment   Media planner (forklift),    Leisure going out to see movies and dinner   Observation/Other Assessments   Observations Pt is anxious during eval, and reports having chronic anxiety about doctors and clinics   Skin Integrity lacerations on skin around left eyelid and on right ear appear to be healing well   Bed Mobility   Bed Mobility --  All bed mobility independent   Transfers   Transfers --  All transfers independent   Ambulation/Gait   Ambulation/Gait Yes   Ambulation/Gait Assistance 5: Supervision  without eye patch   Ambulation/Gait Assistance Details patient requires verbal cues without eye patch to ensure he does not collide with obstacles   Ambulation Distance (Feet) 120 Feet   Assistive  device None   Gait Pattern --  notable sway laterally with increased sway with head turns   Gait velocity 3.25 ft/sec   Stairs Yes   Stairs Assistance 5: Supervision  without patch   Stair Management Technique Two rails;Alternating pattern  with sway noted   Number of Stairs 5   Balance   Balance Assessed Yes   High Level Balance   High Level Balance Activities Head turns;Tandem walking   High Level Balance Comments pt unable to tandem walk without eye patch; has notable sway with head turns while walking but able to steady himself (also without eye patch)   Standardized Balance Assessment    Standardized Balance Assessment Dynamic Gait Index   Dynamic Gait Index   Level Surface Moderate Impairment   Gait with Horizontal Head Turns Moderate Impairment   Steps Mild Impairment  will complete test next session. Limited by time.              PT Short Term Goals - 12/20/13 1814    PT SHORT TERM GOAL #1   Title To be met by 01/21/14: Pt will demonstrate correct performance of home exercise program to improve gaze stabilization, smooth pursuits, scanning environment, back pain etc.   Status New   PT SHORT TERM GOAL #2   Title To be met by 01/21/14: Pt will complete Dynamic Gait Index test and appropriate STG and LTG to be written:______________________________   Status New   PT SHORT TERM GOAL #3   Title To be met by 01/21/14: Pt will demonstrate ability to ascend and descend stairs with single hand rail without eye patch, independently for improved home access.   PT SHORT TERM GOAL #4   Title To be met by 01/21/14: Pt will report decreased presence of double vision without eye patch compared to eval day.   Status New   PT SHORT TERM GOAL #5   Title To be met by 01/21/14: Pt will ambulate 30' in a straight path without staggering outside of a 3 foot  walkway for improved balance without eye patch.   Status New   Additional Short Term Goals   Additional Short Term Goals Yes   PT SHORT TERM GOAL #6   Title To be met by 01/21/14: Pt will decrease oswestry low back pain questionaire score to 22% for decreased disability due to low back pain.   Status New          PT Long Term Goals - 12/20/13 1817    PT LONG TERM GOAL #1   Title To be met by 02/18/13: Pt will demonstrate ability to ambulate on uneven outdoor surfaces x200' independently   Status New   PT [place] #2   Title To be met by 02/18/13: Dynamic Gait index long term goal for decreased fall risk without eye patch:____________________   Status New   PT LONG TERM GOAL #3   Title To be met by 02/18/13: pt will  demonstrate ability to perform gentle horizontal gazex1 stabilitation without blurring without eye patch for improved eye function.   Status New   PT LONG TERM GOAL #4   Title To be met by 02/18/13: pt will demonstrate equal timing of smooth pursuits in both eyes (on day one, right eye had significant lag)   Status New   PT LONG TERM GOAL #5   Title To be met by 02/18/13: Pt will improve Oswestry Low Back Pain Questionaire score to 9 points =18%. On eval day,  disability score was 28%   Additional Long Term Goals   Additional Long Term Goals Yes   PT LONG TERM GOAL #6   Title To be met by 02/18/13: Pt will improve FOTO functional status to 77 for improved quality of life.   Status New          Plan - 12/20/13 1807    Clinical Impression Statement Pt has significant visuao-vestibular impairment with secondary balance and gait impairments and will benefit from physical therapy to address these concerns and decrease risk of falls and iprove all mobility. In addition he has notable low back pain due to lumbar transverse proces fracture and pt will be taught core stabilization activities and transfer techniques to decrease pain with mobility.   Pt will benefit from skilled therapeutic intervention in order to improve on the following deficits Abnormal gait;Decreased balance;Decreased coordination;Difficulty walking;Impaired vision/preception;Pain;Other (comment)  visuo vestibular impairments   Rehab Potential Good   PT Frequency 2x / week   PT Duration 8 weeks  if more therapy is needed after 8 weeks, will submit to case manager   PT Treatment/Interventions ADLs/Self Care Home Management;Functional mobility training;Stair training;Gait training;Therapeutic activities;Therapeutic exercise;Balance training;Neuromuscular re-education;Visual/perceptual remediation/compensation   PT Next Visit Plan Finish completing DGI and write appropriate STG and LTG, provide HEP to include eye exercises, balance with  eyes closed as appropriate, core stabilization        Problem List Patient Active Problem List   Diagnosis Date Noted  . 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 Blunt trauma 12/09/2013  . 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 Facial laceration 12/09/2013  . Lumbar transverse process fracture 12/09/2013  . Pneumocephalus, traumatic 12/09/2013  . Traumatic ossicular dislocation 12/09/2013  . Concussion 12/09/2013  . 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 TBI (traumatic brain injury) 12/09/2013  . Skull base fx 12/06/2013              Vestibular Assessment - 12/20/13 1039    Occulomotor Alignment Abnormal  Right eye spontaneously drifts upwards   Smooth Pursuits Comment  decreased timing with right eye   Saccades Slow;Comment  undershoots with right eye   VOR 1 Head Only (x 1 viewing) impaired for horizontal, intact for vertical                                        Gailyn Crook, Anderson Malta D 12/20/2013, 6:30 PM
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
## 2013-12-27 ENCOUNTER — Telehealth: Payer: Self-pay | Admitting: Physical Medicine & Rehabilitation

## 2013-12-27 NOTE — Telephone Encounter (Signed)
Michael Hooper nurse case manager called about patient, wife states patient getting worse since he left rehab, wants to know if patient needs to be seen sooner.  Patient has an appointment on 12/7 with Dr. Riley KillSwartz.  Please call Michael MouldingRuth at [phone]

## 2013-12-28 ENCOUNTER — Ambulatory Visit: Payer: Self-pay | Admitting: Rehabilitative and Restorative Service Providers"

## 2013-12-28 ENCOUNTER — Encounter: Payer: Self-pay | Admitting: *Deleted

## 2013-12-28 ENCOUNTER — Ambulatory Visit: Payer: Self-pay | Admitting: Physical Therapy

## 2013-12-28 ENCOUNTER — Encounter: Payer: Self-pay | Admitting: Occupational Therapy

## 2013-12-28 ENCOUNTER — Ambulatory Visit: Payer: Worker's Compensation | Attending: Physical Medicine & Rehabilitation

## 2013-12-28 DIAGNOSIS — H539 Unspecified visual disturbance: Secondary | ICD-10-CM | POA: Diagnosis not present

## 2013-12-28 DIAGNOSIS — M549 Dorsalgia, unspecified: Secondary | ICD-10-CM

## 2013-12-28 DIAGNOSIS — R279 Unspecified lack of coordination: Secondary | ICD-10-CM

## 2013-12-28 NOTE — Patient Instructions (Signed)
Tape the right eye to sleep and continue to use your eye drops as recommended.  Because the right eye fatigues so easily, you feel the need to keep the left eye closed all day. We want to progress to increased time with both eyes open.  To do this: 1. When you wake up and complete your morning routeine, try to keep both eyes open for as long as you can without dizziness or discomfort. When you become dizzy or uncomfortable, patch the right eye to give it a break for 1 hour, but continue about your day as usual.  2. After 1 hour, take the patch off and try to use both eyes for at least an hour. If you need to patch the right eye again for a break, limit it to 1 hour.  3. Continue this cycle throughout the day, patching the right eye no more than 4x for 1 hour each.  *Do this with the 1 hour breaks up to 4x per day for 1-2 weeks. Then slowly wean down, continuing with 4 breaks but decreasing as follows:  Week 2-3: 45 minutes 4x/day Week 4: 30 minutes 4x/day Week 5: 15 minutes 4x/day Week 6: 10 minutes, then 5 minutes, then 0 minutes.

## 2013-12-28 NOTE — Telephone Encounter (Signed)
I called and spoke with Windell Mouldinguth.  She said the patient is getting discouraged and does not feel like he is getting any better and she felt that is not something she should keep to herself. I told her I would pass this along to Dr Riley KillSwartz.  He has PT appt today Wed and Friday as well. " It is not like he is sitting home".
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
## 2013-12-29 NOTE — Telephone Encounter (Signed)
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 I would suggest that he see Dr. Eula FlaxMichael Zelson for evaluation over at [facility] where he's receiving therapies. Can make the referral if they would like. I could potentially see him in the office a bit sooner also.

## 2013-12-29 NOTE — Telephone Encounter (Signed)
I think she was hoping that you might call the patient

## 2013-12-30 ENCOUNTER — Ambulatory Visit: Payer: Worker's Compensation

## 2013-12-30 DIAGNOSIS — R279 Unspecified lack of coordination: Secondary | ICD-10-CM

## 2013-12-30 DIAGNOSIS — H547 Unspecified visual loss: Secondary | ICD-10-CM

## 2013-12-30 DIAGNOSIS — H539 Unspecified visual disturbance: Secondary | ICD-10-CM

## 2013-12-30 DIAGNOSIS — M549 Dorsalgia, unspecified: Secondary | ICD-10-CM

## 2013-12-30 NOTE — Patient Instructions (Signed)
Oculomotor: Saccades   Holding two targets (or fingers)  positioned side by side 12 inches apart and 18 inches from your face, move eyes quickly from target to target as head stays still. Move 2 seconds each direction. Be sure to allow eyes to focus before moving to the other object. Perform 10x in each direction (horizontal, vertical, diagonal)   Scanning the room: While sitting in a chair, look in one corner of the room and let it come into focus, then slowly move eyes to scan the room (lead with your eyes, not your yead). Perform 5x across the room, and 5x up and down.   Focusing on the letter "A" Hold the letter A in front of you and just try to focus on it. Spend 3 minutes trying this. Do this 5x throughout your day. Don't move your head.    Practice stringing pretzel rings.    Tap your lower eyelid rapidly but gently while trying to blink. Perform multiple times throughout your day.  Copyright  VHI. All rights reserved.

## 2013-12-30 NOTE — Therapy (Signed)
[facility] [address] [place], [place], [address] Phone: [phone]   Fax:  [phone]  Physical Therapy Treatment  Patient Details  Name: Michael Hooper MRN: [id number] Date of Birth: 1960-12-26  Encounter Date: 12/30/2013      PT End of Session - 12/30/13 0857    Visit Number 3   Number of Visits 16   Date for PT Re-Evaluation 02/18/14   Authorization Type Worker's Comp. Case Manager is Fran Lowes cell [phone];    PT Start Time [phone]   PT Stop Time 0845   PT Time Calculation (min) 38 min      Past Medical History  Diagnosis Date  . Asthma     History reviewed. No pertinent past surgical history.  There were no vitals taken for this visit.  Visit Diagnosis:  Lack of coordination  Visual disturbance  Back pain without radiation  Visual impairment      Subjective Assessment - 12/30/13 0809    Symptoms Went to neuropsych yesterday and it was the most exhausting thing I've done and It gave me a headache   Currently in Pain? No/denies  "no pain" but a dull headache     See pt instructions for details of session. Home exercise program provided today. Pt performed all exercises during session. Required frequent visual rest breaks.          PT Education - 12/30/13 0857    Education provided Yes   Education Details HEP   Person(s) Educated Patient;Spouse   Methods Explanation;Demonstration;Verbal cues;Handout   Comprehension Verbalized understanding;Returned demonstration          PT Short Term Goals - 12/30/13 0858    PT SHORT TERM GOAL #1   Title To be met by 01/21/14: Pt will demonstrate correct performance of home exercise program to improve gaze stabilization, smooth pursuits, scanning environment, back pain etc.   Status On-going   PT SHORT TERM GOAL #2   Title To be met by 01/21/14: Pt will increase score on dynamic gait index 22/24 points for improved balance   Status On-going   PT  SHORT TERM GOAL #3   Title To be met by 01/21/14: Pt will demonstrate ability to ascend and descend stairs with single hand rail without eye patch, independently for improved home access.   Status On-going   PT SHORT TERM GOAL #4   Title To be met by 01/21/14: Pt will report decreased presence of double vision without eye patch compared to eval day.   Status On-going   PT SHORT TERM GOAL #5   Title To be met by 01/21/14: Pt will ambulate 30' in a straight path without staggering outside of a 3 foot  walkway for improved balance without eye patch.   Status On-going   PT SHORT TERM GOAL #6   Title To be met by 01/21/14: Pt will decrease oswestry low back pain questionaire score to 22% for decreased disability due to low back pain.   Status On-going          PT Long Term Goals - 12/30/13 7340    PT LONG TERM GOAL #1   Title To be met by 02/18/13: Pt will demonstrate ability to ambulate on uneven outdoor surfaces x200' independently   Status On-going   PT LONG TERM GOAL #2   Title To be met by 02/18/13:Pt will increase dynamic gait index score to 24/24 for decrased fall risk.   Status On-going   PT LONG TERM GOAL #3  Title To be met by 02/18/13: pt will demonstrate ability to perform gentle horizontal gazex1 stabilitation without blurring without eye patch for improved eye function.   Status On-going   PT LONG TERM GOAL #4   Title To be met by 02/18/13: pt will demonstrate equal timing of smooth pursuits in both eyes (on day one, right eye had significant lag)   Status On-going   PT LONG TERM GOAL #5   Title To be met by 02/18/13: Pt will improve Oswestry Low Back Pain Questionaire score to 9 points =18%. On eval day, disability score was 28%   Status On-going   PT LONG TERM GOAL #6   Title To be met by 02/18/13: Pt will improve FOTO functional status to 77 for improved quality of life.   Status On-going          Plan - 12/30/13 0857    Clinical Impression Statement Pt is improving  in his ability to close the R upper eyelid, but lower lid is not closing as effectively. Expected to continue to progress.                               Problem List Patient Active Problem List   Diagnosis Date Noted  . 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 Blunt trauma 12/09/2013  . 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 Facial laceration 12/09/2013  . Lumbar transverse process fracture 12/09/2013  . Pneumocephalus, traumatic 12/09/2013  . Traumatic ossicular dislocation 12/09/2013  . Concussion 12/09/2013  . 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 TBI (traumatic brain injury) 12/09/2013  . Skull base fx 12/06/2013    Amyriah Buras, Anderson Malta D 12/30/2013, 9:00 AM

## 2014-01-03 ENCOUNTER — Encounter: Payer: Self-pay | Admitting: Physical Medicine & Rehabilitation

## 2014-01-03 ENCOUNTER — Encounter
Payer: Worker's Compensation | Attending: Physical Medicine & Rehabilitation | Admitting: Physical Medicine & Rehabilitation

## 2014-01-03 ENCOUNTER — Ambulatory Visit: Payer: Worker's Compensation

## 2014-01-03 VITALS — BP 132/68 | HR 81 | Resp 14 | Ht 70.0 in | Wt 235.0 lb

## 2014-01-03 DIAGNOSIS — X58XXXS Exposure to other specified factors, sequela: Secondary | ICD-10-CM | POA: Diagnosis not present

## 2014-01-03 DIAGNOSIS — S0219XS Other fracture of base of skull, sequela: Secondary | ICD-10-CM

## 2014-01-03 DIAGNOSIS — H4921 Sixth [abducent] nerve palsy, right eye: Secondary | ICD-10-CM | POA: Diagnosis not present

## 2014-01-03 DIAGNOSIS — H7421 Discontinuity and dislocation of right ear ossicles: Secondary | ICD-10-CM

## 2014-01-03 DIAGNOSIS — H902 Conductive hearing loss, unspecified: Secondary | ICD-10-CM | POA: Diagnosis not present

## 2014-01-03 DIAGNOSIS — R51 Headache: Secondary | ICD-10-CM | POA: Insufficient documentation

## 2014-01-03 DIAGNOSIS — G529 Cranial nerve disorder, unspecified: Secondary | ICD-10-CM

## 2014-01-03 DIAGNOSIS — F341 Dysthymic disorder: Secondary | ICD-10-CM

## 2014-01-03 DIAGNOSIS — G510 Bell's palsy: Secondary | ICD-10-CM

## 2014-01-03 DIAGNOSIS — S069X9S Unspecified intracranial injury with loss of consciousness of unspecified duration, sequela: Secondary | ICD-10-CM

## 2014-01-03 DIAGNOSIS — F329 Major depressive disorder, single episode, unspecified: Secondary | ICD-10-CM

## 2014-01-03 DIAGNOSIS — S0210XS Unspecified fracture of base of skull, sequela: Secondary | ICD-10-CM

## 2014-01-03 DIAGNOSIS — M545 Low back pain: Secondary | ICD-10-CM | POA: Insufficient documentation

## 2014-01-03 DIAGNOSIS — S069X0S Unspecified intracranial injury without loss of consciousness, sequela: Secondary | ICD-10-CM | POA: Insufficient documentation

## 2014-01-03 DIAGNOSIS — Z79891 Long term (current) use of opiate analgesic: Secondary | ICD-10-CM | POA: Diagnosis not present

## 2014-01-03 MED ORDER — OXYCODONE HCL ER 10 MG PO T12A: 10.0000 mg | EXTENDED_RELEASE_TABLET | Freq: Every day | ORAL | Status: DC

## 2014-01-03 MED ORDER — OXYCODONE HCL 10 MG PO TABS
5.0000 mg | ORAL_TABLET | Freq: Four times a day (QID) | ORAL | Status: AC | PRN
Start: 2014-01-03 — End: ?

## 2014-01-03 MED ORDER — ESCITALOPRAM OXALATE 10 MG PO TABS: 10.0000 mg | ORAL_TABLET | Freq: Every day | ORAL | Status: AC

## 2014-01-03 MED ORDER — ALPRAZOLAM 0.5 MG PO TABS: 0.5000 mg | ORAL_TABLET | Freq: Three times a day (TID) | ORAL | Status: AC | PRN

## 2014-01-03 NOTE — Progress Notes (Signed)
Subjective:    Patient ID: Michael Hooper, male    DOB: July 09, 1960, 53 y.o.   MRN: [id number]  HPI  Mr. Michael Hooper is here in follow up of his traumatic brain injury. He is having ongoing headaches which seem to be worse in the morning. He is afraid that they are related to the topamax. The headaches typically seem to be in the frontal region and around the eyes. He denies any cervical symptoms.   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 Dr. Lazarus Salines referred him to Delfino Lovett at [facility] for further treatment which likely will include surgical repair. He also was treated for an infection in his tympanic membrane which has since cleared.  Mr. Michael Hooper saw Dr. Aura Camps the day after he left [place] on rehab who followed up his eye and associated CN injuries. He rx'ed lacrilube and recommended ongoing patching of the right eye for now. Mr. Sainato does feel that he has more difficulties seeing through his left eye---this may be out of fatigue and an out of date eye rx.   His mood has been poor as a result of his injuries and the associated adjustment. He feels that other than being depressed he is also frequently irritable. He doesn't like to go out in public due to being overwhelmed by noises, people, and other distractions which tend to make his anxiety and pain worse. His sleep has been fair. His appetite has not been good, but he's had issues with altered taste and decreased smell since the accident.  His balance is improving. He is attending therapies at [facility] currently.       Pain Inventory Average Pain 7 Pain Right Now 4 My pain is dull  In the last 24 hours, has pain interfered with the following? General activity 7 Relation with others 7 Enjoyment of life 7 What TIME of day is your pain at its worst? night Sleep (in general) Fair  Pain is worse with: inactivity Pain improves with:  medication Relief from Meds: 8  Mobility walk without assistance how many minutes can you walk? n/a ability to climb steps?  yes do you drive?  no Do you have any goals in this area?  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  no x-rays CT/MRI  Physicians involved in your care Any changes since last visit?  no   Family History  Problem Relation Age of Onset  . Heart failure Mother   . Heart failure Father   . Asthma Brother   . Parkinson's disease Father   . Emphysema Mother    History   Social History  . Marital Status: Married    Spouse Name: N/A    Number of Children: N/A  . Years of Education: N/A   Social History Main Topics  . Smoking status: Never Smoker   . Smokeless tobacco: None  . Alcohol Use: 2.4 oz/week    4 Cans of beer per week  . Drug Use: No  . Sexual Activity: Yes   Other Topics Concern  . None   Social History Narrative   History reviewed. No pertinent past surgical  history. Past Medical History  Diagnosis Date  . Asthma    BP 132/68 mmHg  Pulse 81  Resp 14  Ht 5\' 10"  (1.778 m)  Wt 235 lb (106.595 kg)  BMI 33.72 kg/m2  SpO2 95%  Opioid Risk Score:   Fall Risk Score: Moderate Fall Risk (6-13 points) Review of Systems  Constitutional: Positive for appetite change and unexpected weight change.  Gastrointestinal: Positive for diarrhea.  Neurological: Positive for dizziness and numbness.  Psychiatric/Behavioral: Positive for dysphoric mood. The patient is nervous/anxious.   All other systems reviewed and are negative.      Objective:   Physical Exam  HENT: oral mucosa pink and moist Head: Normocephalic.  Right ear healed.  Is able to hear fingers rubbing right hand Eyes: Pupils are equal, round, and reactive to light. Right conjunctiva is injected somewhat, large amounts of ointment in and around eye    Neck: Normal range of motion. Neck supple.  Respiratory: Effort normal and breath sounds normal.  GI: Soft. Bowel sounds are normal. He exhibits no distension. There is no tenderness.  Musculoskeletal: He exhibits no edema or tenderness.    Neurological: He is alert and oriented to person, place, and time.  Speech clear. Right eye abduction weakness and right lig lag with attempts at closure.  Right facial paresis. Cannot close right eye lid completely. Follows commands without difficulty. Able to sequence words and numbers with extra time and some cueing. A and O x3. Recalled current events. Remembered 3/3 words after 5 minutes. Abstract thinking somewhat concrete. Can get distracted.  fair insight and awareness. Does not appear agitated or labile. More flat than anything else. UE: 5/5 prox to distal. LE's 4 to 4+/5 prox to distal. No sensory findings. dtr's 1+. Gait normal-. Tandem gait slightly impaired.  Skin: Skin is warm and dry.  Large ecchymotic area on mid lower back slowly improving.    Assessment/Plan: 1. Functional deficits secondary to Temporal skull fracture with traumatic brain injury and polytrauma  -spent some time discussing the sequelae of this injury which are listed below.   -at this point I believe he is experiencing depression which is affecting his pain levels and leading to irritability as well.   -initiated lexapro 10mg  qhs to assist with depression and anxiety   -needs ongoing counseling to address coping skills. Await feedback from recent visit   -reassess attention, mood lability at next visit    -reviewed the importance of 8-10 hours of sleep at night.   -also reviewed realistic expectation given the injuries he's dealing with 2.Taste/smell: likely related to CN II  and VII injuries  -may be related to topamax also  -would expect general improvement over the next several months. (perhaps with topamax cessation also) 3. Pain Management: Continue oxycodone prn and oxycontin for now at hs. This oxycontin may in fact be causing more of a "rebound" phenomenon than the topamax is. Consider stopping this soon.  4. Right ossicular disruption: Per Dr. Lazarus SalinesWolicki and Joelene Millinliver   5. Basilar skull fracture:  6. Right CN VII,VI palsy:  Lacrilube, eye patch, goggles. Follow up with Dr. Karleen HampshireSpencer  -a new vision rx may help with any tension component related to overuse of left eye 7. Headaches: given that headaches may be more tension and ortho trauma based, we'll taper off the topamax and see how he does. I would also like to see if the topamax cessation helps taste.  -consider stopping oxycontin also given potential rebound phenomenon. 8. Back pain:  lumbar rom, strengthening   Return in about 1 month (around 02/03/2014). Over 30 minutes was spent with patient, wife and case manager today. All questions were encouraged and answered.

## 2014-01-03 NOTE — Patient Instructions (Addendum)
CUT TOPAMAX IN HALF FOR 4 DAYS THEN STOP.  8-10 HOURS OF SLEEP PER NIGHT  CONTINUE TAKING OXYCONTIN AT NIGHT---MAY STOP THIS SOON.   FOLLOW UP WITH DR. SPENCER REGARDING NEW EYE GLASS PRESCRIPTION

## 2014-01-05 ENCOUNTER — Ambulatory Visit: Payer: Worker's Compensation

## 2014-01-05 ENCOUNTER — Ambulatory Visit: Payer: Self-pay

## 2014-01-05 DIAGNOSIS — R279 Unspecified lack of coordination: Secondary | ICD-10-CM

## 2014-01-05 DIAGNOSIS — M549 Dorsalgia, unspecified: Secondary | ICD-10-CM

## 2014-01-05 DIAGNOSIS — H539 Unspecified visual disturbance: Secondary | ICD-10-CM

## 2014-01-05 NOTE — Therapy (Signed)
[facility] [address] [place], [place], [address] Phone: [phone]   Fax:  [phone]  Physical Therapy Treatment  Patient Details  Name: Michael Hooper MRN: [id number] Date of Birth: 1960-11-03  Encounter Date: 01/05/2014      PT End of Session - 01/05/14 1139    Visit Number 4   Number of Visits 16   Date for PT Re-Evaluation 02/18/14   Authorization Type Worker's Comp. Case Manager is Fran Lowes cell [phone];    PT Start Time 0848   PT Stop Time 0930   PT Time Calculation (min) 42 min      Past Medical History  Diagnosis Date  . Asthma     History reviewed. No pertinent past surgical history.  There were no vitals taken for this visit.  Visit Diagnosis:  Lack of coordination  Visual disturbance  Back pain without radiation      Subjective Assessment - 01/05/14 0852    Symptoms Went to a doctor for my ear and may need to have surgery (but doesn't know timeframe); left eye continues to want to stay closed.    Currently in Pain? No/denies     Self care: Thorough discussion/review of previous discussion with pt regarding the liklihood that these new "annoyances" he is just now noticing after the injury are simply part of the healing process. Pt reports anxiety about various "different' and "new" sensations he is experiencing. This is likely due to the fact that he is no longer in the acute stage of injury with emergent concerns, and so he has more attention to the small changes; not that these are entirely new issues as is the pt's concern. This seemed to set the pt at ease and he provided the example that "my rubberband-like sensation in the left arm is gone now." Also discussed the importance of recognizing progress and celebrating it.    Ambulation with small steps and head turns in parallel bars, progressed to perform in hallway with large steps and head turns while walking forward and while walking  backward. Multiple trials with only one loss of balance requiring MOD A to steady. Verbal cues for both eyes open.  Compensatory 360 degree turns on compliant mat, leading with head movement, followed by body movement. 5x left and 5x right. Verbal cues for both eyes to stay open.  Ambulating on compliant balance beam with visual focus on target 20' away (walking toward target up to 10' away), verbal cue for both eyes open, intermittent UE support on bars, then without UE support with CGA.   Ambulating on solid surface toward target 10' away and walking all the way up to the target, working on convergence.      PT Education - 01/05/14 1139    Education provided Yes   Education Details see self care section   Person(s) Educated Patient   Methods Explanation   Comprehension Verbalized understanding          PT Short Term Goals - 01/05/14 1142    PT SHORT TERM GOAL #1   Title To be met by 01/21/14: Pt will demonstrate correct performance of home exercise program to improve gaze stabilization, smooth pursuits, scanning environment, back pain etc.   Status On-going   PT SHORT TERM GOAL #2   Title To be met by 01/21/14: Pt will increase score on dynamic gait index 22/24 points for improved balance   Status On-going   PT SHORT TERM GOAL #3   Title To  be met by 01/21/14: Pt will demonstrate ability to ascend and descend stairs with single hand rail without eye patch, independently for improved home access.   Status On-going   PT SHORT TERM GOAL #4   Title To be met by 01/21/14: Pt will report decreased presence of double vision without eye patch compared to eval day.   Status On-going   PT SHORT TERM GOAL #5   Title To be met by 01/21/14: Pt will ambulate 30' in a straight path without staggering outside of a 3 foot  walkway for improved balance without eye patch.   Status On-going   PT SHORT TERM GOAL #6   Title To be met by 01/21/14: Pt will decrease oswestry low back pain questionaire  score to 22% for decreased disability due to low back pain.   Status On-going          PT Long Term Goals - 01/05/14 1142    PT LONG TERM GOAL #1   Title To be met by 02/18/13: Pt will demonstrate ability to ambulate on uneven outdoor surfaces x200' independently   Status On-going   PT LONG TERM GOAL #2   Title To be met by 02/18/13:Pt will increase dynamic gait index score to 24/24 for decrased fall risk.   Status On-going   PT LONG TERM GOAL #3   Title To be met by 02/18/13: pt will demonstrate ability to perform gentle horizontal gazex1 stabilitation without blurring without eye patch for improved eye function.   Status On-going   PT LONG TERM GOAL #4   Title To be met by 02/18/13: pt will demonstrate equal timing of smooth pursuits in both eyes (on day one, right eye had significant lag)   Status On-going   PT LONG TERM GOAL #5   Title To be met by 02/18/13: Pt will improve Oswestry Low Back Pain Questionaire score to 9 points =18%. On eval day, disability score was 28%   Status On-going   PT LONG TERM GOAL #6   Title To be met by 02/18/13: Pt will improve FOTO functional status to 77 for improved quality of life.   Status On-going          Plan - 01/05/14 1140    Clinical Impression Statement Pt is improving in ability to keep both eyes open with verbal cueing. Double vision remains present but is improving. Pt continues to have anxiety about the recovery process. Continue per POC.   PT Next Visit Plan Review eye HEP and Provide HEP for balance with eyes closed and core stabilization                               Problem List Patient Active Problem List   Diagnosis Date Noted  . 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 Blunt trauma 12/09/2013  . 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 Facial laceration 12/09/2013  . Lumbar transverse process fracture 12/09/2013  . Pneumocephalus, traumatic 12/09/2013  . Traumatic  ossicular dislocation 12/09/2013  . Concussion 12/09/2013  . 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 TBI (traumatic brain injury) 12/09/2013  . Skull base fx 12/06/2013    Kendrew Paci, Anderson Malta D 01/05/2014, 11:48 AM

## 2014-01-07 ENCOUNTER — Ambulatory Visit: Payer: Worker's Compensation | Attending: Physical Medicine & Rehabilitation

## 2014-01-07 DIAGNOSIS — R279 Unspecified lack of coordination: Secondary | ICD-10-CM

## 2014-01-07 DIAGNOSIS — H539 Unspecified visual disturbance: Secondary | ICD-10-CM | POA: Diagnosis not present

## 2014-01-07 DIAGNOSIS — M549 Dorsalgia, unspecified: Secondary | ICD-10-CM | POA: Diagnosis not present

## 2014-01-07 NOTE — Patient Instructions (Signed)
Feet Together (Compliant Surface) Head Motion - Eyes Closed   Stand with your back to a corner and with a chair in front of you (to hold on if you become unsteady) on compliant surface: two stacked pillows with feet together. Close eyes and move head slowly, up and down 15x, then side to side 15x, then diagonal each direction 15x. Perform in the morning and at night.  *To make this more challenging, you can stand in feet partial heel-toe position  Turning   Tilt head down 15-30, lead with head and eyes and slowly make quarter turn to left with eyes open. Hold position until symptoms subside. Repeat 3 times each direction per session. Do 3 sessions per day.  Copyright  VHI. All rights reserved.    Bilateral Isometric Hip Flexion   Tighten stomach and raise both knees to outstretched arms. Push gently, keeping arms straight, trunk rigid. Hold as long as you can up to 30 seconds. Repeat 3 times daily.  http://orth.exer.us/1100   Copyright  VHI. All rights reserved.  Isometric Core Strengthening laying on bed part A, B and C a. Legs:  i. Lay on bed with feet making a "v" ii. Squeeze heels together and dig your legs down into the bed b. Abs: (do this at the same time as "legs") i. Now tighten your deep abdominal muscles by pulling your belly button into your spine (you can feel the muscle tighten if you place fingertips just above your groin) ii. Hold this tight c. Arms: i. With your arms by your sides, press hands and arms into the bed, as if they are super glued on the bed, no one should be able to pull them up. Hold this tight with the abs and feet. Hold as long as you can hold all three. Perform 3 sets  Copyright  VHI. All rights reserved.

## 2014-01-07 NOTE — Therapy (Signed)
[facility] [address] [place], [place], [address] Phone: [phone]   Fax:  [phone]  Physical Therapy Treatment  Patient Details  Name: Michael Hooper MRN: [id number] Date of Birth: January 22, 1961  Encounter Date: 01/07/2014      PT End of Session - 01/07/14 1128    Visit Number 5   Number of Visits 16   Date for PT Re-Evaluation 02/18/14   Authorization Type Worker's Comp. Case Manager is Luanna SalkRuth Watchorn cell [phone];    PT Start Time 0848   PT Stop Time 0930   PT Time Calculation (min) 42 min      Past Medical History  Diagnosis Date  . Asthma     History reviewed. No pertinent past surgical history.  There were no vitals taken for this visit.  Visit Diagnosis:  Lack of coordination  Visual disturbance  Back pain without radiation      Subjective Assessment - 01/07/14 0850    Symptoms Went to ophthalmologist wednesday and pt will have a follow up with him in 6 weeks to determine if surgery is recommended. This morning I felt pretty good.   Currently in Pain? No/denies        Neuro re-ed: standing in corner with feet together and eyes closed, progressed to head turns, progressed to compliant surface with feet apart and eyes closed, added head turns, progressed to feet together with head turns all directions x20 with intermittent fingertip support. (added to HEP)  Convergence activities: focusing on target alternating between 2' away and 1.5' away.; then with bead activitiy  Therex: see updated HEP for therex today: isometric hip flexion and isometric glut med, transverse abdominis and latissimus dorsi activation.      PT Education - 01/07/14 1127    Education provided Yes   Education Details HEP see self care   Person(s) Educated Patient;Child(ren)   Methods Explanation;Demonstration;Verbal cues   Comprehension Verbalized understanding;Returned demonstration              Plan - 01/07/14  1128    Clinical Impression Statement Pt is making slow but steady progress toward goals. Continue per POC   PT Next Visit Plan Continue working on balance on compliant surfaces with eyes closed, and eye exercises and core stability                               Problem List Patient Active Problem List   Diagnosis Date Noted  . 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 Blunt trauma 12/09/2013  . 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 Facial laceration 12/09/2013  . Lumbar transverse process fracture 12/09/2013  . Pneumocephalus, traumatic 12/09/2013  . Traumatic ossicular dislocation 12/09/2013  . Concussion 12/09/2013  . 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 TBI (traumatic brain injury) 12/09/2013  . Skull base fx 12/06/2013    Dylan Monforte, Victorino DikeJennifer D 01/07/2014, 11:31 AM

## 2014-01-10 ENCOUNTER — Ambulatory Visit: Payer: Worker's Compensation

## 2014-01-10 DIAGNOSIS — H539 Unspecified visual disturbance: Secondary | ICD-10-CM

## 2014-01-10 DIAGNOSIS — R279 Unspecified lack of coordination: Secondary | ICD-10-CM | POA: Diagnosis not present

## 2014-01-10 DIAGNOSIS — M549 Dorsalgia, unspecified: Secondary | ICD-10-CM

## 2014-01-10 NOTE — Patient Instructions (Signed)
Sit to Side-Lying   Sit on edge of bed. 1. Turn head 45 to right. 2. Maintain head position and lie down slowly on left side. Hold until symptoms subside +30 seconds. 3. Sit up slowly. Hold until symptoms subside +30 seconds. 4. Turn head 45 to left. 5. Maintain head position and lie down slowly on right side. Hold until symptoms subside +30 seconds. 6. Sit up slowly. Repeat sequence 3 times per session. Do 2 sessions per day until your symptoms of spinning sensation are clear for 2 weeks.  Copyright  VHI. All rights reserved.

## 2014-01-10 NOTE — Therapy (Signed)
[facility] [address] [place], [place], [address] Phone: [phone]   Fax:  [phone]  Physical Therapy Treatment  Patient Details  Name: Michael Hooper MRN: [id number] Date of Birth: [birth year]  Encounter Date: 01/10/2014      PT End of Session - 01/10/14 2130    Visit Number 6   Number of Visits 16   Date for PT Re-Evaluation 02/18/14   Authorization Type Worker's Comp. Case Manager is Michael Hooper cell [phone];    PT Start Time 1233   PT Stop Time 1315   PT Time Calculation (min) 42 min      Past Medical History  Diagnosis Date  . Asthma     History reviewed. No pertinent past surgical history.  There were no vitals taken for this visit.  Visit Diagnosis:  Lack of coordination  Visual disturbance  Back pain without radiation      Subjective Assessment - 01/10/14 1235    Symptoms Pt reports that he did well this weekend and was able to get out of the house and walk around. He also reports feeling dizzy with bed mobility.   Currently in Pain? Yes   Pain Score 4   dull headache over the eye     CRP: Dix hallpike and 3 reps of Epley's maneuver to treat L ear positive for vertigo  Neuro re-ed: Taught brandt daroff and pt performed 3 cycles of this with verbal cues required. Also performed convergence exercise in sitting with improved convergence noted today compared to prior session.           PT Education - 01/10/14 2130    Education provided Yes   Education Details brandt daroff   Person(s) Educated Patient;Spouse   Methods Explanation;Demonstration;Tactile cues;Verbal cues;Handout   Comprehension Verbalized understanding;Returned demonstration              Plan - 01/10/14 2131    Clinical Impression Statement Pt presented today with report of dizziness with bed mobility and was symptomatic with presence of nystagmus with left Gilberto Betterix Hallpike. After performing Epleys 3 reps, and brandt  daroff 3 reps, pt was clear of all vertigo when retested with bed mobility.  Continue per plan of care   PT Next Visit Plan Assess for vertigo; Continue working on balance on compliant surfaces with eyes closed, and eye exercises and core stability                               Problem List Patient Active Problem List   Diagnosis Date Noted  . 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 Blunt trauma 12/09/2013  . 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 Facial laceration 12/09/2013  . Lumbar transverse process fracture 12/09/2013  . Pneumocephalus, traumatic 12/09/2013  . Traumatic ossicular dislocation 12/09/2013  . Concussion 12/09/2013  . 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 TBI (traumatic brain injury) 12/09/2013  . Skull base fx 12/06/2013    Michael Hooper, Michael Hooper 01/10/2014, 9:36 PM

## 2014-01-12 ENCOUNTER — Ambulatory Visit: Payer: Worker's Compensation

## 2014-01-12 DIAGNOSIS — H539 Unspecified visual disturbance: Secondary | ICD-10-CM

## 2014-01-12 DIAGNOSIS — R279 Unspecified lack of coordination: Secondary | ICD-10-CM | POA: Diagnosis not present

## 2014-01-12 DIAGNOSIS — M549 Dorsalgia, unspecified: Secondary | ICD-10-CM

## 2014-01-12 NOTE — Therapy (Signed)
[facility] [address] [place], [place], [address] Phone: [phone]   Fax:  [phone]  Physical Therapy Treatment  Patient Details  Name: Michael Hooper MRN: [id number] Date of Birth: 07/05/1960  Encounter Date: 01/12/2014      PT End of Session - 01/12/14 1201    Visit Number 7   Number of Visits 16   Date for PT Re-Evaluation 02/18/14   Authorization Type Worker's Comp. Case Manager is Fran Lowes cell [phone];    PT Start Time 1103   PT Stop Time 1145   PT Time Calculation (min) 42 min      Past Medical History  Diagnosis Date  . Asthma     History reviewed. No pertinent past surgical history.  There were no vitals taken for this visit.  Visit Diagnosis:  Lack of coordination  Visual disturbance  Back pain without radiation      Subjective Assessment - 01/12/14 1106    Symptoms Pt reports he is continuing to have some vertigo in the mornings but the exercises help to decrease it.   Currently in Pain? Yes   Pain Score 2    Pain Location Back   Pain Orientation Right;Lower       Performed L dix hallpike with pt reporting symptoms of vertigo no nystagmus noted. Treated with L epleys. Performed R dix hallpike - negative.   Therex: 3x10 bridging with resistance through range Lumbar rocking x30 3x to form fatigue isometric hip flexion 3x10 bridging+marcing with verbal cue to keep pelvis stable Quadruped: alternate UE lifts x10 each, alternate LE lifts x10 each Ardine Eng pose x ~5 seconds, pt reported this was uncomfortable so it was discontinued.    Manual therapy x8 minutes- trigger point release and soft tissue massage of right lower back.   Neuro re-ed: x8 minutes in parallel bars with and then without UE support, rockerboard anterior/posterior limits of stability with eyes closed; then standing on inverted BOSU ball with anterior/posterior rocking with MIN A to steady with UE support, then 5  squats on inverted BOSU ball.           PT Short Term Goals - 01/12/14 1206    PT SHORT TERM GOAL #1   Title To be met by 01/21/14: Pt will demonstrate correct performance of home exercise program to improve gaze stabilization, smooth pursuits, scanning environment, back pain etc.   Status On-going   PT SHORT TERM GOAL #2   Title To be met by 01/21/14: Pt will increase score on dynamic gait index 22/24 points for improved balance   Status On-going   PT SHORT TERM GOAL #3   Title To be met by 01/21/14: Pt will demonstrate ability to ascend and descend stairs with single hand rail without eye patch, independently for improved home access.   Status On-going   PT SHORT TERM GOAL #4   Title To be met by 01/21/14: Pt will report decreased presence of double vision without eye patch compared to eval day.   Status On-going   PT SHORT TERM GOAL #5   Title To be met by 01/21/14: Pt will ambulate 30' in a straight path without staggering outside of a 3 foot  walkway for improved balance without eye patch.   Status On-going   PT SHORT TERM GOAL #6   Title To be met by 01/21/14: Pt will decrease oswestry low back pain questionaire score to 22% for decreased disability due to low back pain.   Status On-going  PT Long Term Goals - 01/12/14 1206    PT LONG TERM GOAL #1   Title To be met by 02/18/13: Pt will demonstrate ability to ambulate on uneven outdoor surfaces x200' independently   Status On-going   PT LONG TERM GOAL #2   Title To be met by 02/18/13:Pt will increase dynamic gait index score to 24/24 for decrased fall risk.   Status On-going   PT LONG TERM GOAL #3   Title To be met by 02/18/13: pt will demonstrate ability to perform gentle horizontal gazex1 stabilitation without blurring without eye patch for improved eye function.   Status On-going   PT LONG TERM GOAL #4   Title To be met by 02/18/13: pt will demonstrate equal timing of smooth pursuits in both eyes (on day one,  right eye had significant lag)   Status On-going   PT LONG TERM GOAL #5   Title To be met by 02/18/13: Pt will improve Oswestry Low Back Pain Questionaire score to 9 points =18%. On eval day, disability score was 28%   Status On-going   PT LONG TERM GOAL #6   Title To be met by 02/18/13: Pt will improve FOTO functional status to 77 for improved quality of life.   Status On-going          Plan - 01/12/14 1203    Clinical Impression Statement Pt's vertigo is improving, pt is demonstrating improved ability to close the eye, improved balance. Reporting more back pain, but his reports of the pain are vague and unable to ellicit the pain with movemebt or palpation Continue per POC   PT Next Visit Plan Assess for vertigo; Continue working on balance on compliant surfaces with eyes closed, and eye exercises and core stability                               Problem List Patient Active Problem List   Diagnosis Date Noted  . 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 Blunt trauma 12/09/2013  . 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 Facial laceration 12/09/2013  . Lumbar transverse process fracture 12/09/2013  . Pneumocephalus, traumatic 12/09/2013  . Traumatic ossicular dislocation 12/09/2013  . Concussion 12/09/2013  . 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 TBI (traumatic brain injury) 12/09/2013  . Skull base fx 12/06/2013    Desteny Freeman, Anderson Malta D 01/12/2014, 12:12 PM

## 2014-01-13 ENCOUNTER — Telehealth: Payer: Self-pay | Admitting: *Deleted

## 2014-01-13 NOTE — Telephone Encounter (Signed)
Would like Dr. Rosalyn ChartersSwartz's email so she can send the Neuro Report that she has for this patient.  Stated patient is having increased anxiety and depression.  Also would like to move up pt's appt from 02/18/14 to the earlist appt available.

## 2014-01-17 ENCOUNTER — Ambulatory Visit: Payer: Worker's Compensation

## 2014-01-17 DIAGNOSIS — M549 Dorsalgia, unspecified: Secondary | ICD-10-CM

## 2014-01-17 DIAGNOSIS — R279 Unspecified lack of coordination: Secondary | ICD-10-CM | POA: Diagnosis not present

## 2014-01-17 DIAGNOSIS — H539 Unspecified visual disturbance: Secondary | ICD-10-CM

## 2014-01-17 NOTE — Therapy (Signed)
[facility] [address] [place], [place], [address] Phone: [phone]   Fax:  [phone]  Physical Therapy Treatment  Patient Details  Name: Michael Hooper MRN: [id number] Date of Birth: Nov 17, 1960  Encounter Date: 01/17/2014      PT End of Session - 01/17/14 1231    Visit Number 8   Number of Visits 16   Date for PT Re-Evaluation 02/18/14   Authorization Type Worker's Comp. Case Manager is Luanna Salk cell [phone];    PT Start Time [phone]   PT Stop Time 0844   PT Time Calculation (min) 40 min      Past Medical History  Diagnosis Date  . Asthma     History reviewed. No pertinent past surgical history.  There were no vitals taken for this visit.  Visit Diagnosis:  Lack of coordination  Visual disturbance  Back pain without radiation      Subjective Assessment - 01/17/14 0818    Symptoms "I don't feel like the double vision is improving. I will probably need surgery."   Pain Score 2    Pain Location Back          [facility] PT Assessment - 01/17/14 0001    Observation/Other Assessments   Other Surveys  Select   Oswestry Disability Index  19 points = 39%     Checked all STGs-see goals section  Visuo-vestibular exercises: Convergence training Saccades horizontal, vertical, diagonal Static focusing on single target 90 degree turns leading with the head.              [facility] Adult PT Treatment/Exercise - 01/17/14 0001    Dynamic Gait Index   Level Surface Normal   Change in Gait Speed Normal   Gait with Horizontal Head Turns Mild Impairment   Gait with Vertical Head Turns Mild Impairment   Gait and Pivot Turn Normal   Step Over Obstacle Normal   Step Around Obstacles Normal   Steps Mild Impairment   Total Score 21                  PT Short Term Goals - 01/17/14 0805    PT SHORT TERM GOAL #1   Title To be met by 01/21/14: Pt will demonstrate correct performance of  home exercise program to improve gaze stabilization, smooth pursuits, scanning environment, back pain etc.   Status Achieved   PT SHORT TERM GOAL #2   Title To be met by 01/21/14: Pt will increase score on dynamic gait index 22/24 points for improved balance   Status Not Met  Scored 21/24 on 01/17/14   PT SHORT TERM GOAL #3   Title To be met by 01/21/14: Pt will demonstrate ability to ascend and descend stairs with single hand rail without eye patch, independently for improved home access.   Status Achieved   PT SHORT TERM GOAL #4   Title To be met by 01/21/14: Pt will report decreased presence of double vision without eye patch compared to eval day.   Status Not Met   PT SHORT TERM GOAL #5   Title To be met by 01/21/14: Pt will ambulate 30' in a straight path without staggering outside of a 3 foot  walkway for improved balance without eye patch.   Status Achieved   PT SHORT TERM GOAL #6   Title To be met by 01/21/14: Pt will decrease oswestry low back pain questionaire score to 22% for decreased disability due to low back pain.  Status Not Met  38% on 01/17/14           PT Long Term Goals - 01/12/14 1206    PT LONG TERM GOAL #1   Title To be met by 02/18/13: Pt will demonstrate ability to ambulate on uneven outdoor surfaces x200' independently   Status On-going   PT LONG TERM GOAL #2   Title To be met by 02/18/13:Pt will increase dynamic gait index score to 24/24 for decrased fall risk.   Status On-going   PT LONG TERM GOAL #3   Title To be met by 02/18/13: pt will demonstrate ability to perform gentle horizontal gazex1 stabilitation without blurring without eye patch for improved eye function.   Status On-going   PT LONG TERM GOAL #4   Title To be met by 02/18/13: pt will demonstrate equal timing of smooth pursuits in both eyes (on day one, right eye had significant lag)   Status On-going   PT LONG TERM GOAL #5   Title To be met by 02/18/13: Pt will improve Oswestry Low Back Pain  Questionaire score to 9 points =18%. On eval day, disability score was 28%   Status On-going   PT LONG TERM GOAL #6   Title To be met by 02/18/13: Pt will improve FOTO functional status to 77 for improved quality of life.   Status On-going               Plan - 01/17/14 1232    Clinical Impression Statement Pt reports he has stopped taking pain medication which is a likely reason that his low back pain has increased compared to evaulation. Will emphasize core strengthening next visit. Pt also demonstrates decreased awareness of improvement in eye strength/function. Continues to have double vision and unable to fully focus on an object to create a single image.    PT Next Visit Plan assess for vertigo, core stability        Problem List Patient Active Problem List   Diagnosis Date Noted  . 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 Blunt trauma 12/09/2013  . 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 Facial laceration 12/09/2013  . Lumbar transverse process fracture 12/09/2013  . Pneumocephalus, traumatic 12/09/2013  . Traumatic ossicular dislocation 12/09/2013  . Concussion 12/09/2013  . 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 TBI (traumatic brain injury) 12/09/2013  . Skull base fx 12/06/2013    Delrae Sawyers D 01/17/2014, 12:36 PM  [facility] [address] [place], [place], [address] Phone: [phone]   Fax:  [phone]

## 2014-01-19 ENCOUNTER — Ambulatory Visit: Payer: Worker's Compensation

## 2014-01-19 DIAGNOSIS — R279 Unspecified lack of coordination: Secondary | ICD-10-CM | POA: Diagnosis not present

## 2014-01-19 DIAGNOSIS — M549 Dorsalgia, unspecified: Secondary | ICD-10-CM

## 2014-01-19 DIAGNOSIS — H539 Unspecified visual disturbance: Secondary | ICD-10-CM

## 2014-01-19 NOTE — Patient Instructions (Signed)
Isometric Core Strengthening laying on bed part A, B and C a. Legs:  i. Lay on bed with feet making a "v" ii. Squeeze heels together and dig your legs down into the bed b. Abs: (do this at the same time as "legs") i. Now tighten your deep abdominal muscles by pulling your belly button into your spine (you can feel the muscle tighten if you place fingertips just above your groin) ii. Hold this tight c. Arms: i. With your arms by your sides, press hands and arms into the bed, as if they are super glued on the bed, no one should be able to pull them up. ii. Hold this tight with the abs and feet. Hold as long as you can hold all three. Perform 5 sets 

## 2014-01-19 NOTE — Therapy (Signed)
[facility] [address] [place], [place], [address] Phone: [phone]   Fax:  [phone]  Physical Therapy Treatment  Patient Details  Name: Michael Hooper MRN: [id number] Date of Birth: 11-04-1960  Encounter Date: 01/19/2014      PT End of Session - 01/19/14 0838    Visit Number 9   Number of Visits 16   Date for PT Re-Evaluation 02/18/14   Authorization Type Worker's Comp. Case Manager is Fran Lowes cell [phone];    PT Start Time 0805   PT Stop Time 0845   PT Time Calculation (min) 40 min      Past Medical History  Diagnosis Date  . Asthma     History reviewed. No pertinent past surgical history.  There were no vitals taken for this visit.  Visit Diagnosis:  Lack of coordination  Visual disturbance  Back pain without radiation      Subjective Assessment - 01/19/14 0818    Symptoms Been having a headache today. Took tylenol this morning.    Currently in Pain? Yes   Pain Score 4    Pain Location Head      3x30 seconds upper traps stretch 3x30 seconds neck rotation stretch 3x30 seconds sterno cleido mastoid stretch 5x 30 seconds isometric latissimus dorsi, gluteus medius, transferse abdominis contractions 3 sets of flutter kicks propped on forearms to form fatigue ~17 each 3 sets of scissor kicks in supine propped on forearms to form fatigue ~5 each leg 3x30 seconds hip adduction stretch 3x10 bilateral simultaneous straight leg raise 3 forearm planks to form fatigue 5-8 seconds. 3x10 super mans with BUE/head/chest only 3x10 simultaneous prone BLE straight raises 3x10 reciprocal RLE/LUE and LLE/RUE prone lifts.                        PT Education - 01/19/14 [phone]    Education provided Yes   Education Details isometric core strength HEP   Person(s) Educated Patient   Methods Explanation;Demonstration;Handout   Comprehension Verbalized understanding;Returned  demonstration          PT Short Term Goals - 01/17/14 0805    PT SHORT TERM GOAL #1   Title To be met by 01/21/14: Pt will demonstrate correct performance of home exercise program to improve gaze stabilization, smooth pursuits, scanning environment, back pain etc.   Status Achieved   PT SHORT TERM GOAL #2   Title To be met by 01/21/14: Pt will increase score on dynamic gait index 22/24 points for improved balance   Status Not Met  Scored 21/24 on 01/17/14   PT SHORT TERM GOAL #3   Title To be met by 01/21/14: Pt will demonstrate ability to ascend and descend stairs with single hand rail without eye patch, independently for improved home access.   Status Achieved   PT SHORT TERM GOAL #4   Title To be met by 01/21/14: Pt will report decreased presence of double vision without eye patch compared to eval day.   Status Not Met   PT SHORT TERM GOAL #5   Title To be met by 01/21/14: Pt will ambulate 30' in a straight path without staggering outside of a 3 foot  walkway for improved balance without eye patch.   Status Achieved   PT SHORT TERM GOAL #6   Title To be met by 01/21/14: Pt will decrease oswestry low back pain questionaire score to 22% for decreased disability due to low back pain.  Status Not Met  38% on 01/17/14           PT Long Term Goals - 01/12/14 1206    PT LONG TERM GOAL #1   Title To be met by 02/18/13: Pt will demonstrate ability to ambulate on uneven outdoor surfaces x200' independently   Status On-going   PT LONG TERM GOAL #2   Title To be met by 02/18/13:Pt will increase dynamic gait index score to 24/24 for decrased fall risk.   Status On-going   PT LONG TERM GOAL #3   Title To be met by 02/18/13: pt will demonstrate ability to perform gentle horizontal gazex1 stabilitation without blurring without eye patch for improved eye function.   Status On-going   PT LONG TERM GOAL #4   Title To be met by 02/18/13: pt will demonstrate equal timing of smooth pursuits in  both eyes (on day one, right eye had significant lag)   Status On-going   PT LONG TERM GOAL #5   Title To be met by 02/18/13: Pt will improve Oswestry Low Back Pain Questionaire score to 9 points =18%. On eval day, disability score was 28%   Status On-going   PT LONG TERM GOAL #6   Title To be met by 02/18/13: Pt will improve FOTO functional status to 77 for improved quality of life.   Status On-going               Plan - 01/19/14 0839    Clinical Impression Statement Pt continues to report headaches which are likely due to visual impairment, although neck stretching was performed today. Will continue to work on core strengthening to address back pain, and balance training and visuo vestibular training.  Pt can perform all bed mobility without vertigo.   PT Next Visit Plan Balance on compliant surfaces, eye exercises        Problem List Patient Active Problem List   Diagnosis Date Noted  . 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 Blunt trauma 12/09/2013  . 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 Facial laceration 12/09/2013  . Lumbar transverse process fracture 12/09/2013  . Pneumocephalus, traumatic 12/09/2013  . Traumatic ossicular dislocation 12/09/2013  . Concussion 12/09/2013  . 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 TBI (traumatic brain injury) 12/09/2013  . Skull base fx 12/06/2013    Delrae Sawyers D 01/19/2014, 8:45 AM  [facility] [address] [place], [place], [address] Phone: [phone]   Fax:  [phone]

## 2014-01-26 ENCOUNTER — Inpatient Hospital Stay: Payer: Self-pay | Admitting: Physical Medicine & Rehabilitation

## 2014-01-26 ENCOUNTER — Ambulatory Visit: Payer: Worker's Compensation

## 2014-01-26 DIAGNOSIS — M549 Dorsalgia, unspecified: Secondary | ICD-10-CM

## 2014-01-26 DIAGNOSIS — R279 Unspecified lack of coordination: Secondary | ICD-10-CM | POA: Diagnosis not present

## 2014-01-26 DIAGNOSIS — H539 Unspecified visual disturbance: Secondary | ICD-10-CM

## 2014-01-26 NOTE — Therapy (Deleted)
[facility] [address] [place], [place], [address] Phone: [phone]   Fax:  [phone]  Patient Details  Name: Michael GreeningJames Warbington MRN: [id number] Date of Birth: [birth year]  Encounter Date: 01/26/2014  Convergence training with both eyes uncovered, then each eye uncovered  Recommendation to set gentle alarm to wake up and use eye drops to decrease head ache,   AldersonLynita Lombard, Evolett Somarriba D 01/26/2014, 8:17 AM  [facility] [address] [place], [place], [address] Phone: [phone]   Fax:  [phone]

## 2014-01-26 NOTE — Therapy (Signed)
[facility] [address] [place], [place], [address] Phone: [phone]   Fax:  [phone]  Physical Therapy Treatment  Patient Details  Name: Michael Hooper MRN: [id number] Date of Birth: 11-10-1960  Encounter Date: 01/26/2014      PT End of Session - 01/26/14 0854    Visit Number 10   Number of Visits 16   Date for PT Re-Evaluation 02/18/14   PT Start Time 0803   PT Stop Time 3295   PT Time Calculation (min) 44 min      Past Medical History  Diagnosis Date  . Asthma     History reviewed. No pertinent past surgical history.  There were no vitals taken for this visit.  Visit Diagnosis:  Lack of coordination  Visual disturbance  Back pain without radiation      Subjective Assessment - 01/26/14 0805    Symptoms When I wear the patch on the left eye, I feel more dizzy.   Currently in Pain? No/denies  Took medication this morning.     Neuro Re-ed:  -Convergence training with both eyes uncovered, then each eye individually.  -Eyelid opening/closing techniques with tapping, with R eyelid noted to have delayed response and pt instructed to give the eyelid extra time to perform the task. Performed multiple reps, 3 sets to form fatigue, with upper lid fully closing and lower lid demonstrating improved closing compared to previous sessions. -Compliant surface tasks: ascending/descending compliant ramp with 180 degree turns on flat surface with CGA, stepping over 1"x4" obstacles forward and backward with eyes open then with eyes closed, then lateral, then lateral (right and left) with head turned and maintained right, then lateral (right and left) with head turned and maintained left  Self care: Discussed pt's am headaches with family questioning the possible cause. Re-iterated pt's progress to pt in efforts of decreasing anxiety. Discussed possible proprioceptive impairment and hypersensitivity of the eyelid and  ensured pt we will continue to work on this.                       PT Education - 01/26/14 [phone]    Education provided Yes   Education Details see pt instructions   Person(s) Educated Patient;Spouse   Methods Explanation   Comprehension Verbalized understanding          PT Short Term Goals - 01/17/14 0805    PT SHORT TERM GOAL #1   Title To be met by 01/21/14: Pt will demonstrate correct performance of home exercise program to improve gaze stabilization, smooth pursuits, scanning environment, back pain etc.   Status Achieved   PT SHORT TERM GOAL #2   Title To be met by 01/21/14: Pt will increase score on dynamic gait index 22/24 points for improved balance   Status Not Met  Scored 21/24 on 01/17/14   PT SHORT TERM GOAL #3   Title To be met by 01/21/14: Pt will demonstrate ability to ascend and descend stairs with single hand rail without eye patch, independently for improved home access.   Status Achieved   PT SHORT TERM GOAL #4   Title To be met by 01/21/14: Pt will report decreased presence of double vision without eye patch compared to eval day.   Status Not Met   PT SHORT TERM GOAL #5   Title To be met by 01/21/14: Pt will ambulate 30' in a straight path without staggering outside of a 3 foot  walkway for improved  balance without eye patch.   Status Achieved   PT SHORT TERM GOAL #6   Title To be met by 01/21/14: Pt will decrease oswestry low back pain questionaire score to 22% for decreased disability due to low back pain.   Status Not Met  38% on 01/17/14           PT Long Term Goals - 01/12/14 1206    PT LONG TERM GOAL #1   Title To be met by 02/18/13: Pt will demonstrate ability to ambulate on uneven outdoor surfaces x200' independently   Status On-going   PT LONG TERM GOAL #2   Title To be met by 02/18/13:Pt will increase dynamic gait index score to 24/24 for decrased fall risk.   Status On-going   PT LONG TERM GOAL #3   Title To be met by  02/18/13: pt will demonstrate ability to perform gentle horizontal gazex1 stabilitation without blurring without eye patch for improved eye function.   Status On-going   PT LONG TERM GOAL #4   Title To be met by 02/18/13: pt will demonstrate equal timing of smooth pursuits in both eyes (on day one, right eye had significant lag)   Status On-going   PT LONG TERM GOAL #5   Title To be met by 02/18/13: Pt will improve Oswestry Low Back Pain Questionaire score to 9 points =18%. On eval day, disability score was 28%   Status On-going   PT LONG TERM GOAL #6   Title To be met by 02/18/13: Pt will improve FOTO functional status to 77 for improved quality of life.   Status On-going               Plan - 01/26/14 0855    Clinical Impression Statement Pt demonstrated improved motor learning with ability to correct mis-steps/unsteadiness after a single incorrect first repetition with compliant surface tasks. Also reports continued headaches upon waking which may be due to the entire night without eye drops and pt reporting the eye doesn't remain closed even when taped. Suggested using eye drops at night. Also may be some proprioceptive impairment of the eyelid as pt reports he is unbable to sense if the eye lid is open or closed. Will continue to work on this.   PT Next Visit Plan eyelid desensitization and proprioception techniques, continued balance on compliant surface        Problem List Patient Active Problem List   Diagnosis Date Noted  . 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 Blunt trauma 12/09/2013  . 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 Facial laceration 12/09/2013  . Lumbar transverse process fracture 12/09/2013  . Pneumocephalus, traumatic 12/09/2013  . Traumatic ossicular dislocation 12/09/2013  . Concussion 12/09/2013  . 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 TBI (traumatic brain injury) 12/09/2013  .  Skull base fx 12/06/2013    Delrae Sawyers D 01/26/2014, 9:15 AM  [facility] [address] [place], [place], [address] Phone: [phone]   Fax:  [phone]

## 2014-01-26 NOTE — Patient Instructions (Signed)
Recommendation to set use eye drops any time you wake up in the middle of the night, if you notice this decreases the headaches in the morning, you may consider setting a  gentle alarm to wake up a few times at night and use eye drops to decrease head ache.  For the blinking exercise, be sure to give yourself at least 5 seconds for your eyes to close as much as possible, and then hold eyes closed as long as you can (up to 5 minutes)

## 2014-01-27 ENCOUNTER — Ambulatory Visit: Payer: Worker's Compensation

## 2014-01-27 DIAGNOSIS — R279 Unspecified lack of coordination: Secondary | ICD-10-CM

## 2014-01-27 DIAGNOSIS — M549 Dorsalgia, unspecified: Secondary | ICD-10-CM

## 2014-01-27 DIAGNOSIS — H539 Unspecified visual disturbance: Secondary | ICD-10-CM

## 2014-01-27 NOTE — Therapy (Signed)
[facility] [address] [place], [place], [address] Phone: [phone]   Fax:  [phone]  Physical Therapy Treatment  Patient Details  Name: Michael Hooper MRN: [id number] Date of Birth: [birth year]  Encounter Date: 01/27/2014      PT End of Session - 01/27/14 0849    Visit Number 11   Number of Visits 16   Date for PT Re-Evaluation 02/18/14   Authorization Type Worker's Comp. Case Manager is Fran [employer]es cell [phone];    PT Start Time 0803   PT Stop Time 0845   PT Time Calculation (min) 42 min      Past Medical History  Diagnosis Date  . Asthma     History reviewed. No pertinent past surgical history.  There were no vitals taken for this visit.  Visit Diagnosis:  Lack of coordination  Visual disturbance  Back pain without radiation      Subjective Assessment - 01/27/14 0804    Symptoms Wife reports she put a "big glob" of the ointment in his eye before going to bed to try to keep it as lubricated as possible. Pt slept through the night, and no headaches. He has drainage in the right ear and has contacted his physician about it.   Currently in Pain? No/denies     Entire session without eye patch Dynamic gait/balance training: power walking with rapid stops, turns, and reverses on command with supervision without loss of balance.   Ambulation with continuousrhythmical head turns: 115' each of horizontal, vertical, and diagonal (up left/down right) with supervision, then 115' diagonal (up right/down left) with unsteadiness and MIN A. Rest provided. Then 3x115' of up right/down left diagonals again with CGA first trial, then supervision.  Obstacle course forward,then lateral R then left: 360 degree circles while weaving through cones, compliant balance beam, hurdles on solid and compliant surface,  Basket ball rolling 5x with RLE and 5x with LLE all with MIN A.  Standing on bosu ball with BUE  support and supervision then without UE support and CGA while simultaneously performing saccades all directions. Then performed same exercise in RLE single limb stance with single UE support. This was provided in typed handout to pt for HEP and pt returned correct demo and verbalized understanding.                     PT Education - 01/26/14 [phone]    Education provided Yes   Education Details see pt instructions   Person(s) Educated Patient;Spouse   Methods Explanation   Comprehension Verbalized understanding          PT Short Term Goals - 01/17/14 0805    PT SHORT TERM GOAL #1   Title To be met by 01/21/14: Pt will demonstrate correct performance of home exercise program to improve gaze stabilization, smooth pursuits, scanning environment, back pain etc.   Status Achieved   PT SHORT TERM GOAL #2   Title To be met by 01/21/14: Pt will increase score on dynamic gait index 22/24 points for improved balance   Status Not Met  Scored 21/24 on 01/17/14   PT SHORT TERM GOAL #3   Title To be met by 01/21/14: Pt will demonstrate ability to ascend and descend stairs with single hand rail without eye patch, independently for improved home access.   Status Achieved   PT SHORT TERM GOAL #4   Title To be met by 01/21/14: Pt will report decreased presence of double vision  without eye patch compared to eval day.   Status Not Met   PT SHORT TERM GOAL #5   Title To be met by 01/21/14: Pt will ambulate 30' in a straight path without staggering outside of a 3 foot  walkway for improved balance without eye patch.   Status Achieved   PT SHORT TERM GOAL #6   Title To be met by 01/21/14: Pt will decrease oswestry low back pain questionaire score to 22% for decreased disability due to low back pain.   Status Not Met  38% on 01/17/14           PT Long Term Goals - 01/12/14 1206    PT LONG TERM GOAL #1   Title To be met by 02/18/13: Pt will demonstrate ability to ambulate on uneven  outdoor surfaces x200' independently   Status On-going   PT LONG TERM GOAL #2   Title To be met by 02/18/13:Pt will increase dynamic gait index score to 24/24 for decrased fall risk.   Status On-going   PT LONG TERM GOAL #3   Title To be met by 02/18/13: pt will demonstrate ability to perform gentle horizontal gazex1 stabilitation without blurring without eye patch for improved eye function.   Status On-going   PT LONG TERM GOAL #4   Title To be met by 02/18/13: pt will demonstrate equal timing of smooth pursuits in both eyes (on day one, right eye had significant lag)   Status On-going   PT LONG TERM GOAL #5   Title To be met by 02/18/13: Pt will improve Oswestry Low Back Pain Questionaire score to 9 points =18%. On eval day, disability score was 28%   Status On-going   PT LONG TERM GOAL #6   Title To be met by 02/18/13: Pt will improve FOTO functional status to 77 for improved quality of life.   Status On-going               Plan - 01/27/14 0850    Clinical Impression Statement With all balance and eye exercises today, pt demonstrated progress. He is able to ambulate in much straighter paths than before while perforimg head turns, his eyes demonstrate improved ability to perform saccades. Pt expected to continue to make progress and will continue per plan of care. Will likely d/c at the end of current scheduled visits (02/16/14), but will likely need a new PT evaulation for a second bout of therapy following eye and ear surgeries to maximize functional return.   PT Next Visit Plan eyelid desensitization and proprioception techniques        Problem List Patient Active Problem List   Diagnosis Date Noted  . 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 Blunt trauma 12/09/2013  . 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 Facial laceration 12/09/2013  . Lumbar transverse process fracture 12/09/2013  . Pneumocephalus, traumatic 12/09/2013  . Traumatic  ossicular dislocation 12/09/2013  . Concussion 12/09/2013  . 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 TBI (traumatic brain injury) 12/09/2013  . Skull base fx 12/06/2013    Delrae Sawyers D 01/27/2014, 8:55 AM  [facility] [address] [place], [place], [address] Phone: [phone]   Fax:  [phone]

## 2014-01-27 NOTE — Patient Instructions (Signed)
One leg stand with Saccades:  -Stand with a chair in front of you, left fingertip on chair for balance, facing a door.  -Lift left leg off the ground so you are standing on the right leg only. -With your eyes focus on the upper left corner of a door, then without moving head, look at upper right corner. Each time you move your eyes, let the corner come into focus as much as possible. Perform 10-20x with this horizontal eye motion, then 10-20x vertical, then 10x each diagonal. Blink only as necessary.

## 2014-01-31 ENCOUNTER — Ambulatory Visit: Payer: Worker's Compensation

## 2014-02-02 ENCOUNTER — Ambulatory Visit: Payer: Worker's Compensation | Attending: Physical Medicine & Rehabilitation

## 2014-02-02 DIAGNOSIS — H539 Unspecified visual disturbance: Secondary | ICD-10-CM | POA: Insufficient documentation

## 2014-02-02 DIAGNOSIS — R279 Unspecified lack of coordination: Secondary | ICD-10-CM | POA: Diagnosis not present

## 2014-02-02 DIAGNOSIS — M549 Dorsalgia, unspecified: Secondary | ICD-10-CM | POA: Insufficient documentation

## 2014-02-02 NOTE — Therapy (Signed)
[facility] [address] [place], [place], [address] Phone: [phone]   Fax:  [phone]  Physical Therapy Treatment  Patient Details  Name: Michael Hooper MRN: [id number] Date of Birth: 08-28-1960  Encounter Date: 02/02/2014      PT End of Session - 02/02/14 0843    Visit Number 12   Number of Visits 16   Date for PT Re-Evaluation 02/18/14   Authorization Type Worker's Comp. Case Manager is Fran [employer]es cell [phone];    PT Start Time 0802   PT Stop Time 0841   PT Time Calculation (min) 39 min      Past Medical History  Diagnosis Date  . Asthma     History reviewed. No pertinent past surgical history.  There were no vitals taken for this visit.  Visit Diagnosis:  Lack of coordination  Visual disturbance  Back pain without radiation      Subjective Assessment - 02/02/14 0803    Symptoms Pt saw his ENT (Dr. Danne Baxter) regarding the drainage from the right ear and the doctor said he could resume activity as usual with no precautions, and he had a CT scan but is awaiting results.   Pain Score 2    Pain Location Back   Pain Orientation Lower       Eye exercises: Tapping to facilitate closing Trialed R eye closing with left eye open  With mirror with and without hand assist, unsuccessful   Supine bilateral simultaneous marching with neutral spine/pelvis with active core 4x5, then butterflys with BLE in hooklying with neutral spine pelvis and active core 3x8, supine flutter kick with flat back/active core 3x10 each leg, bridge+marching 3x10 -Quadruped: 3x10 alternate UE lifts 3rd set with 2# weights 3x10 alternate LE lifts with MOD A to stabilize 3x10 reciprocal UE/LE lifts 3rd set with 2# weights  Self care: Discussed pt's recent MD appointment and possibility of surgery; has appointment with maxillo-facial surgeon tomorrow; will be seeing the eye surgeon on the 19th of January. Asked pt to find  out what his precautions will be post surgery and whether or not he will be on hold for therapy so that we can decide on the therapy plan as pt prepares for possible surgery.                        PT Short Term Goals - 01/17/14 0805    PT SHORT TERM GOAL #1   Title To be met by 01/21/14: Pt will demonstrate correct performance of home exercise program to improve gaze stabilization, smooth pursuits, scanning environment, back pain etc.   Status Achieved   PT SHORT TERM GOAL #2   Title To be met by 01/21/14: Pt will increase score on dynamic gait index 22/24 points for improved balance   Status Not Met  Scored 21/24 on 01/17/14   PT SHORT TERM GOAL #3   Title To be met by 01/21/14: Pt will demonstrate ability to ascend and descend stairs with single hand rail without eye patch, independently for improved home access.   Status Achieved   PT SHORT TERM GOAL #4   Title To be met by 01/21/14: Pt will report decreased presence of double vision without eye patch compared to eval day.   Status Not Met   PT SHORT TERM GOAL #5   Title To be met by 01/21/14: Pt will ambulate 30' in a straight path without staggering outside of a 3 foot  walkway for improved balance without eye patch.   Status Achieved   PT SHORT TERM GOAL #6   Title To be met by 01/21/14: Pt will decrease oswestry low back pain questionaire score to 22% for decreased disability due to low back pain.   Status Not Met  38% on 01/17/14           PT Long Term Goals - 01/12/14 1206    PT LONG TERM GOAL #1   Title To be met by 02/18/13: Pt will demonstrate ability to ambulate on uneven outdoor surfaces x200' independently   Status On-going   PT LONG TERM GOAL #2   Title To be met by 02/18/13:Pt will increase dynamic gait index score to 24/24 for decrased fall risk.   Status On-going   PT LONG TERM GOAL #3   Title To be met by 02/18/13: pt will demonstrate ability to perform gentle horizontal gazex1 stabilitation  without blurring without eye patch for improved eye function.   Status On-going   PT LONG TERM GOAL #4   Title To be met by 02/18/13: pt will demonstrate equal timing of smooth pursuits in both eyes (on day one, right eye had significant lag)   Status On-going   PT LONG TERM GOAL #5   Title To be met by 02/18/13: Pt will improve Oswestry Low Back Pain Questionaire score to 9 points =18%. On eval day, disability score was 28%   Status On-going   PT LONG TERM GOAL #6   Title To be met by 02/18/13: Pt will improve FOTO functional status to 77 for improved quality of life.   Status On-going               Plan - 02/02/14 0843    Clinical Impression Statement Pt is making progress. Will determine plan for therapy (hold vs continue vs discharge and resume) once pt has information from surgeons.   PT Next Visit Plan Balance-eyes open and closed on uneven surfaces        Problem List Patient Active Problem List   Diagnosis Date Noted  . 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 Blunt trauma 12/09/2013  . 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 Facial laceration 12/09/2013  . Lumbar transverse process fracture 12/09/2013  . Pneumocephalus, traumatic 12/09/2013  . Traumatic ossicular dislocation 12/09/2013  . Concussion 12/09/2013  . 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 TBI (traumatic brain injury) 12/09/2013  . Skull base fx 12/06/2013    Delrae Sawyers D 02/02/2014, 8:46 AM  [facility] [address] [place], [place], [address] Phone: [phone]   Fax:  [phone]

## 2014-02-07 ENCOUNTER — Encounter: Payer: Worker's Compensation | Admitting: Physical Medicine & Rehabilitation

## 2014-02-07 ENCOUNTER — Ambulatory Visit: Payer: Worker's Compensation

## 2014-02-09 ENCOUNTER — Ambulatory Visit: Payer: Self-pay

## 2014-02-14 ENCOUNTER — Ambulatory Visit: Payer: Self-pay | Admitting: Physical Therapy

## 2014-02-16 ENCOUNTER — Ambulatory Visit: Payer: Self-pay

## 2014-02-18 ENCOUNTER — Ambulatory Visit: Payer: Self-pay | Admitting: Physical Medicine & Rehabilitation

## 2014-03-05 ENCOUNTER — Other Ambulatory Visit: Payer: Self-pay | Admitting: Physical Medicine and Rehabilitation

## 2014-03-14 ENCOUNTER — Ambulatory Visit: Payer: Worker's Compensation | Admitting: Physical Medicine & Rehabilitation

## 2014-03-15 ENCOUNTER — Encounter: Payer: Self-pay | Admitting: Registered Nurse

## 2014-03-15 ENCOUNTER — Encounter: Payer: Worker's Compensation | Attending: Registered Nurse | Admitting: Registered Nurse

## 2014-03-15 ENCOUNTER — Other Ambulatory Visit: Payer: Self-pay | Admitting: Registered Nurse

## 2014-03-15 ENCOUNTER — Ambulatory Visit (HOSPITAL_COMMUNITY)
Admission: RE | Admit: 2014-03-15 | Discharge: 2014-03-15 | Disposition: A | Discharge status: Home / Self Care | Payer: Worker's Compensation | Source: Ambulatory Visit | Attending: Registered Nurse | Admitting: Registered Nurse

## 2014-03-15 VITALS — BP 113/72 | HR 68 | Resp 14

## 2014-03-15 DIAGNOSIS — Z79899 Other long term (current) drug therapy: Secondary | ICD-10-CM

## 2014-03-15 DIAGNOSIS — Z87820 Personal history of traumatic brain injury: Secondary | ICD-10-CM | POA: Diagnosis not present

## 2014-03-15 DIAGNOSIS — M545 Low back pain, unspecified: Secondary | ICD-10-CM

## 2014-03-15 DIAGNOSIS — F419 Anxiety disorder, unspecified: Secondary | ICD-10-CM

## 2014-03-15 DIAGNOSIS — F32A Depression, unspecified: Secondary | ICD-10-CM

## 2014-03-15 DIAGNOSIS — Z5181 Encounter for therapeutic drug level monitoring: Secondary | ICD-10-CM

## 2014-03-15 DIAGNOSIS — G8929 Other chronic pain: Secondary | ICD-10-CM | POA: Diagnosis not present

## 2014-03-15 DIAGNOSIS — M47896 Other spondylosis, lumbar region: Secondary | ICD-10-CM | POA: Diagnosis not present

## 2014-03-15 DIAGNOSIS — R51 Headache: Secondary | ICD-10-CM | POA: Insufficient documentation

## 2014-03-15 DIAGNOSIS — G510 Bell's palsy: Secondary | ICD-10-CM

## 2014-03-15 DIAGNOSIS — F329 Major depressive disorder, single episode, unspecified: Secondary | ICD-10-CM

## 2014-03-15 DIAGNOSIS — R5381 Other malaise: Secondary | ICD-10-CM

## 2014-03-15 DIAGNOSIS — S0210XS Unspecified fracture of base of skull, sequela: Secondary | ICD-10-CM

## 2014-03-15 DIAGNOSIS — S069X9S Unspecified intracranial injury with loss of consciousness of unspecified duration, sequela: Secondary | ICD-10-CM

## 2014-03-15 DIAGNOSIS — H7421 Discontinuity and dislocation of right ear ossicles: Secondary | ICD-10-CM

## 2014-03-15 DIAGNOSIS — S0219XS Other fracture of base of skull, sequela: Secondary | ICD-10-CM

## 2014-03-15 DIAGNOSIS — G529 Cranial nerve disorder, unspecified: Secondary | ICD-10-CM

## 2014-03-15 NOTE — Progress Notes (Signed)
Subjective:    Patient ID: Michael Hooper, male    DOB: [birth year], 54 y.o.   MRN: [id number]  HPI: Michael Hooper is a 54 year old male who returns for follow up for his TBI , chronic pain and medication refill. He says his pain is located in his lower back. He only has headaches occasionally. On March 18,2016 scheduled for combine facial nerve repair at [facility]. Michael Hooper complaining of lower back pain and case worker in room interjecting about X-ray, medications and etc.Also requesting physical therapy. Upon speaking to Michael Hooper he would look towards his wife and caseworker  prior to responding. Will order the X-ray and Physical Therapy.  At the onset of assessment asked the case-worker if she would sit in lobby until assessment was over, she insisted no one has asked her to leave. She removed herself from the room and Michael Hooper requested she return to the room, she returned to the room upon wife request.  The case worker interjecting throughout  the visit.   He's not taking his Oxycontin, last dose two weeks ago. He has oxycodone  tablets prescribed by Michael Hooper #20 and Oxycodone 10 mg tablets #64. Reviewed Narcotic contract he verbalizes understanding.No script given.   Pain Inventory Average Pain 5 Pain Right Now 5 My pain is dull and aching  In the last 24 hours, has pain interfered with the following? General activity 4 Relation with others 4 Enjoyment of life 4 What TIME of day is your pain at its worst? evening Sleep (in general) Fair  Pain is worse with: walking, bending and standing Pain improves with: rest and medication Relief from Meds: 10  Mobility walk without assistance how many minutes can you walk? 90 ability to climb steps?  yes do you drive?  no  Function disabled: date disabled 12/06/13 I need assistance with the following:  household duties and shopping  Neuro/Psych numbness tingling dizziness depression anxiety loss of taste or  smell  Prior Studies Any changes since last visit?  no CT/MRI nerve study  Physicians involved in your care Any changes since last visit?  no   Family History  Problem Relation Age of Onset  . Heart failure Mother   . Heart failure Father   . Asthma Brother   . Parkinson's disease Father   . Emphysema Mother    History   Social History  . Marital Status: Married    Spouse Name: N/A  . Number of Children: N/A  . Years of Education: N/A   Social History Main Topics  . Smoking status: Never Smoker   . Smokeless tobacco: Not on file  . Alcohol Use: 2.4 oz/week    4 Cans of beer per week  . Drug Use: No  . Sexual Activity: Yes   Other Topics Concern  . None   Social History Narrative   History reviewed. No pertinent past surgical history. Past Medical History  Diagnosis Date  . Asthma    BP 113/72 mmHg  Pulse 68  Resp 14  SpO2 94%  Opioid Risk Score:   Fall Risk Score:   Review of Systems  Constitutional:       Excessive thirst Loss of taste   Neurological: Positive for dizziness.       Tingling   Psychiatric/Behavioral: Positive for dysphoric mood. The patient is nervous/anxious.   All other systems reviewed and are negative.      Objective:   Physical Exam  Constitutional: He is oriented  to person, place, and time. He appears well-developed and well-nourished.  HENT:  Head: Normocephalic and atraumatic.  Neck: Normal range of motion. Neck supple.  Cardiovascular: Normal rate and regular rhythm.   Pulmonary/Chest: Effort normal and breath sounds normal.  Musculoskeletal:  Normal Muscle Bulk and Muscle Testing Reveals: Upper Extremities: Full ROM and Muscle Strength 5/5 Spinal Forward Flexion 90 Degrees and Extension 20 Degrees Lumbar Paraspinal Tenderness: L-3- L-5 Lower Extremities: Full ROM and Muscle Strength 5/5 Arises from table with ease Narrow Based Gait  Neurological: He is alert and oriented to person, place, and time.  Skin: Skin  is warm and dry.  Psychiatric: He has a normal mood and affect.  Nursing note and vitals reviewed.         Assessment & Plan:  1. Functional deficits secondary to Temporal skull fracture with traumatic brain injury and polytrauma: RX: Physical Therapy 2. Pain Management: Continue oxycodone prn  3. Right ossicular disruption: Michael [facility]oper and Oliver[facility]  4. Basilar skull fracture: [facility] 5. Right CN VII,VI palsy: Lacrilube, eye patch, goggles. Michael Hooper Following 6. Headaches:  Improved: Occuring Occasionally.Oxycontin Discontinued. 7.. Back pain:RX: Lumbar Xray and Referral for Physical Therapy.  Continue oxycodone , no script given.Continue to monitor 8. Anxiety and Depression: Michael Hooper Following Over 60 minutes of face to face patient care time was spent with patient, wife and case manager today. All questions were encouraged and answered.   F/U with Michael Hooper

## 2014-03-16 ENCOUNTER — Telehealth: Payer: Self-pay | Admitting: *Deleted

## 2014-03-17 LAB — PMP ALCOHOL METABOLITE (ETG): Ethyl Glucuronide (EtG): NEGATIVE ng/mL

## 2014-03-17 NOTE — Telephone Encounter (Signed)
Spoke to [name] regarding his X-ray results. He verbalized understanding.

## 2014-03-21 LAB — OXYCODONE, URINE (LC/MS-MS)
Noroxycodone, Ur: 1762 ng/mL (ref <50)
OXYMORPHONE, URINE: 222 ng/mL (ref <50)
Oxycodone, ur: NEGATIVE ng/mL — AB (ref <50)

## 2014-03-21 LAB — BENZODIAZEPINES (GC/LC/MS), URINE
ALPRAZOLAMU: 147 ng/mL (ref <25)
Clonazepam metabolite (GC/LC/MS), ur confirm: NEGATIVE ng/mL (ref <25)
Flurazepam metabolite (GC/LC/MS), ur confirm: NEGATIVE ng/mL (ref <50)
LORAZEPAMU: NEGATIVE ng/mL (ref <50)
MIDAZOLAMU: NEGATIVE ng/mL (ref <50)
Nordiazepam (GC/LC/MS), ur confirm: NEGATIVE ng/mL (ref <50)
Oxazepam (GC/LC/MS), ur confirm: NEGATIVE ng/mL (ref <50)
TRIAZOLAMU: NEGATIVE ng/mL (ref <50)
Temazepam (GC/LC/MS), ur confirm: NEGATIVE ng/mL (ref <50)

## 2014-03-22 LAB — PRESCRIPTION MONITORING PROFILE (SOLSTAS)
AMPHETAMINE/METH: NEGATIVE ng/mL
BARBITURATE SCREEN, URINE: NEGATIVE ng/mL
Buprenorphine, Urine: NEGATIVE ng/mL
CREATININE, URINE: 152.3 mg/dL (ref >20.0)
Cannabinoid Scrn, Ur: NEGATIVE ng/mL
Carisoprodol, Urine: NEGATIVE ng/mL
Cocaine Metabolites: NEGATIVE ng/mL
Fentanyl, Ur: NEGATIVE ng/mL
MDMA URINE: NEGATIVE ng/mL
MEPERIDINE UR: NEGATIVE ng/mL
METHADONE SCREEN, URINE: NEGATIVE ng/mL
NITRITES URINE, INITIAL: NEGATIVE ug/mL
Opiate Screen, Urine: NEGATIVE ng/mL
PH URINE, INITIAL: 6.5 pH (ref 4.5–8.9)
Propoxyphene: NEGATIVE ng/mL
TAPENTADOLUR: NEGATIVE ng/mL
TRAMADOL UR: NEGATIVE ng/mL
Zolpidem, Urine: NEGATIVE ng/mL

## 2014-04-01 NOTE — Progress Notes (Signed)
Urine drug screen for this encounter is consistent for prescribed medication 

## 2014-05-02 ENCOUNTER — Ambulatory Visit: Payer: Self-pay | Admitting: Physical Medicine & Rehabilitation

## 2014-05-12 NOTE — Therapy (Signed)
[facility] [address] [place], [place], [address] Phone: [phone]   Fax:  [phone]  Patient Details  Name: Michael Hooper MRN: [id number] Date of Birth: 01-07-1961 Referring Provider:  No ref. provider found  Encounter Date: 05/12/2014  PHYSICAL THERAPY DISCHARGE SUMMARY  Visits from Start of Care: 12  Current functional level related to goals / functional outcomes: Unknown at this time. Pt was placed on medical hold due to upcoming eye surgery back in January and has not returned since then. Progress towards goals at that time is as follows:       PT Short Term Goals - 01/17/14 0805    PT SHORT TERM GOAL #1   Title To be met by 01/21/14: Pt will demonstrate correct performance of home exercise program to improve gaze stabilization, smooth pursuits, scanning environment, back pain etc.   Status Achieved   PT SHORT TERM GOAL #2   Title To be met by 01/21/14: Pt will increase score on dynamic gait index 22/24 points for improved balance   Status Not Met  Scored 21/24 on 01/17/14   PT SHORT TERM GOAL #3   Title To be met by 01/21/14: Pt will demonstrate ability to ascend and descend stairs with single hand rail without eye patch, independently for improved home access.   Status Achieved   PT SHORT TERM GOAL #4   Title To be met by 01/21/14: Pt will report decreased presence of double vision without eye patch compared to eval day.   Status Not Met   PT SHORT TERM GOAL #5   Title To be met by 01/21/14: Pt will ambulate 30' in a straight path without staggering outside of a 3 foot  walkway for improved balance without eye patch.   Status Achieved   PT SHORT TERM GOAL #6   Title To be met by 01/21/14: Pt will decrease oswestry low back pain questionaire score to 22% for decreased disability due to low back pain.   Status Not Met  38% on 01/17/14        Remaining deficits: Dizziness, visual disturbance, balance  impairment   Education / Equipment: Home exercise program to address eye coordination and dizziness and to address balance  Plan: Patient agrees to discharge.  Patient goals were partially met. Patient is being discharged due to not returning since the last visit. and placed on medical hold in January 2016 ?????      Delrae Sawyers, PT,DPT,NCS 05/12/2014 2:19 PM Phone [phone] FAX [phone]         [facility] [address] [place], [place], [address] Phone: [phone]   Fax:  [phone]

## 2016-08-03 IMAGING — CT CT HEAD W/O CM
4 of 6 series · 15 of 47 positions shown, 17 images · non-contrast
Comparison: None.

CLINICAL DATA: Patient was pinned between a forklift and [employer] at
work, difficult hearing in both ears, lacerations to LEFT face and
LEFT eye

EXAM:
CT HEAD WITHOUT CONTRAST
CT MAXILLOFACIAL WITHOUT CONTRAST
CT CERVICAL SPINE WITHOUT CONTRAST
TECHNIQUE: Multidetector CT imaging of the head, cervical spine, and
maxillofacial structures were performed using the standard protocol
without intravenous contrast. Multiplanar CT image reconstructions
of the cervical spine and maxillofacial structures were also
generated. RIGHT-side of face marked with a BB.

[Series 3: facial/ orbits 2.0 h30s · axial · 0.41mm/px · z∈[-221,-85]mm · 6 of 96 slices shown, 8 images]
[im 14/96  brain]
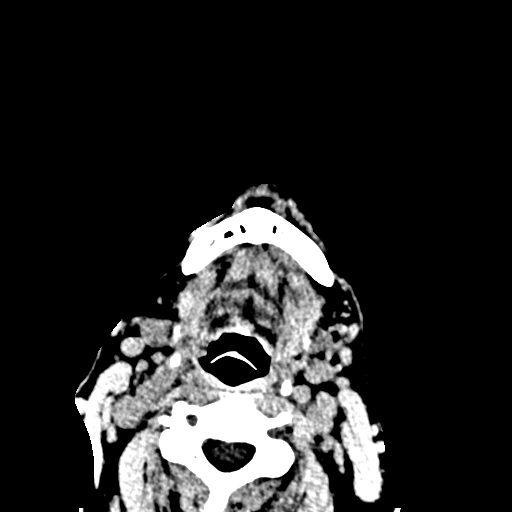
[im 14/96  bone]
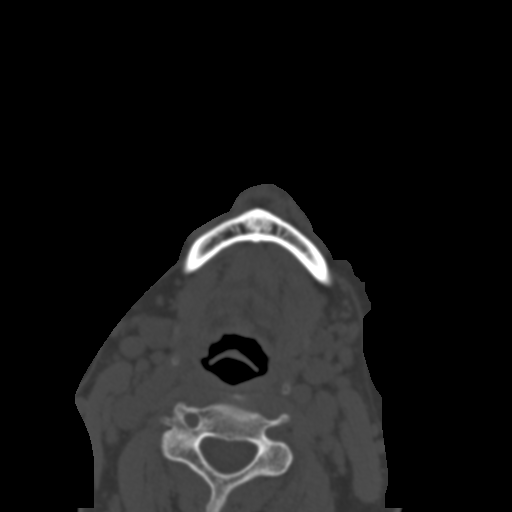
[im 28/96  brain]
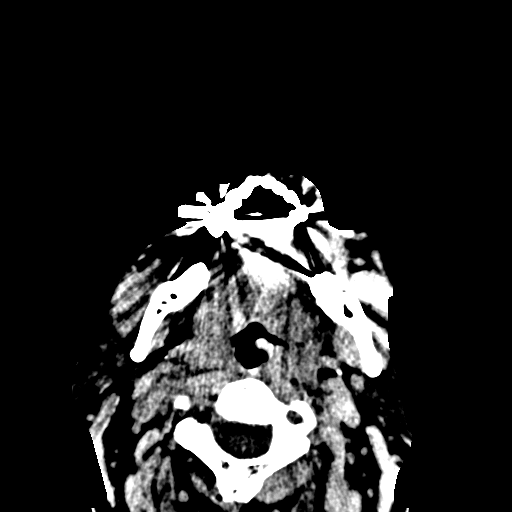
[im 41/96  brain]
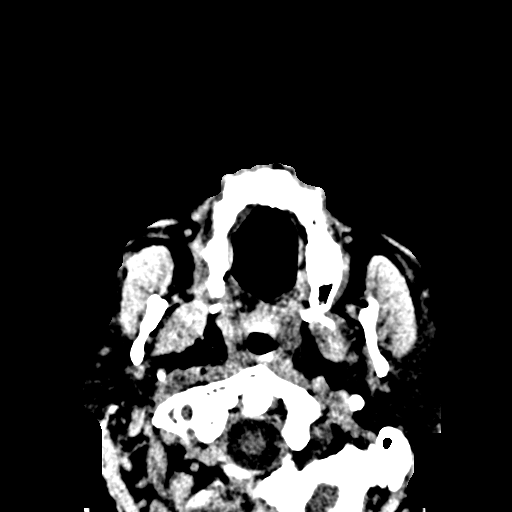
[im 55/96  brain]
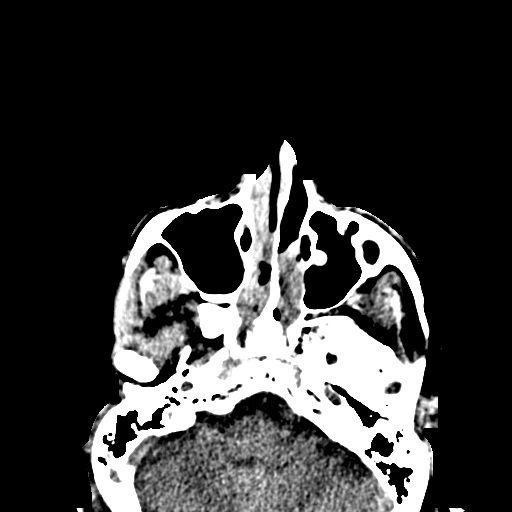
[im 68/96  brain]
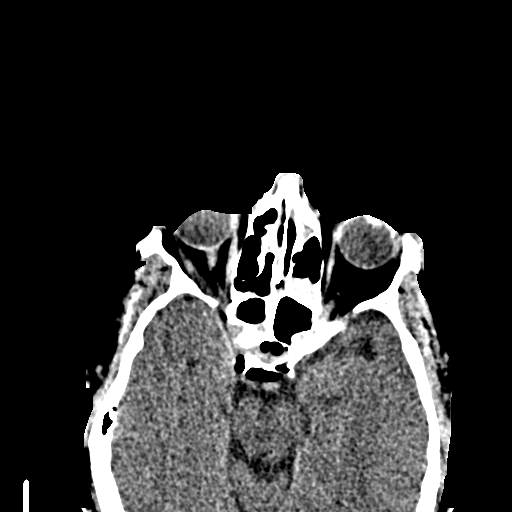
[im 68/96  bone]
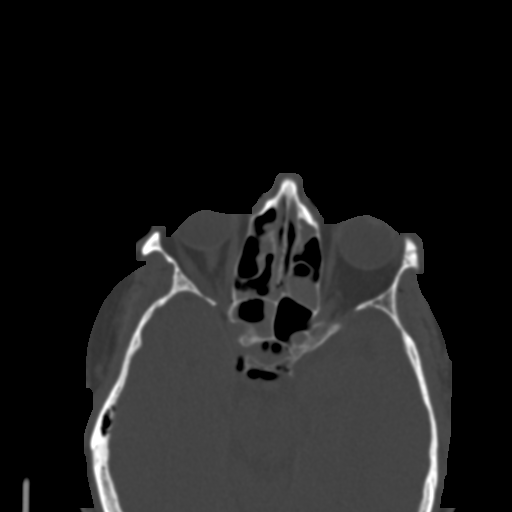
[im 82/96  brain]
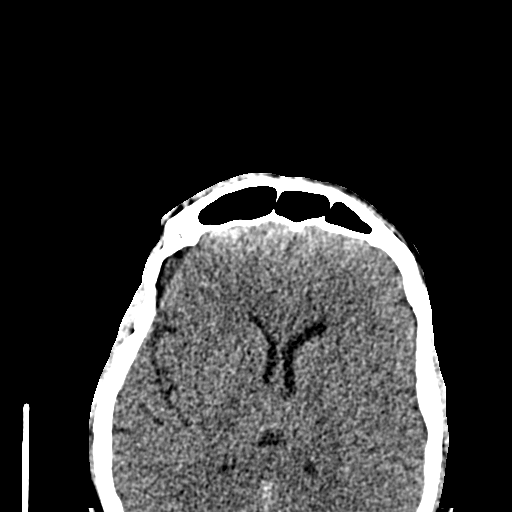

[Series 606: sagittal c spine · sagittal · 0.51mm/px · 3 of 49 slices shown]
[im 17/49  brain]
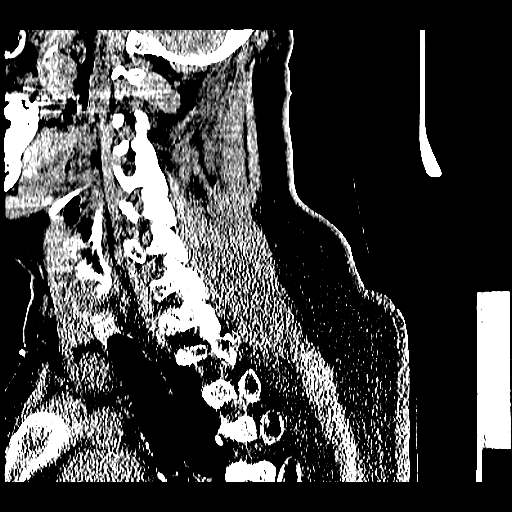
[im 25/49  brain]
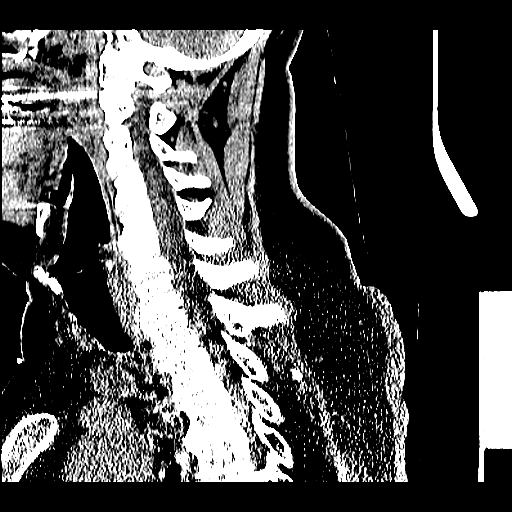
[im 33/49  brain]
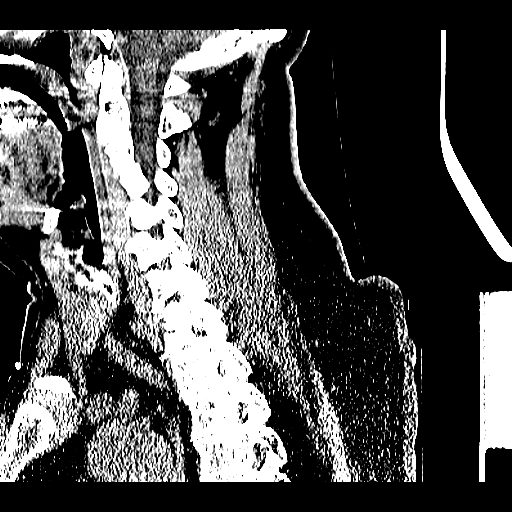

[Series 607: coronal cspine · coronal · 0.51mm/px · 3 of 50 slices shown]
[im 17/50  brain]
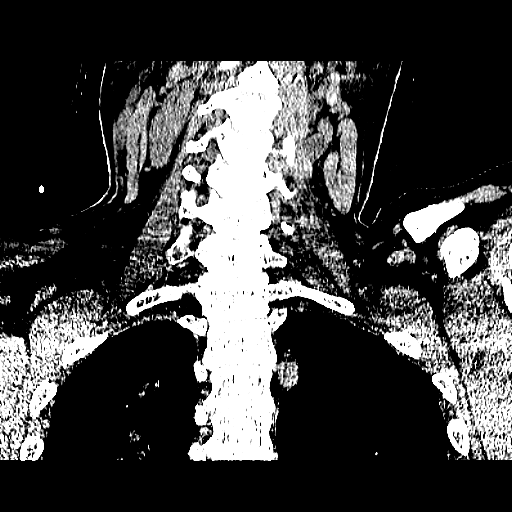
[im 22/50  brain]
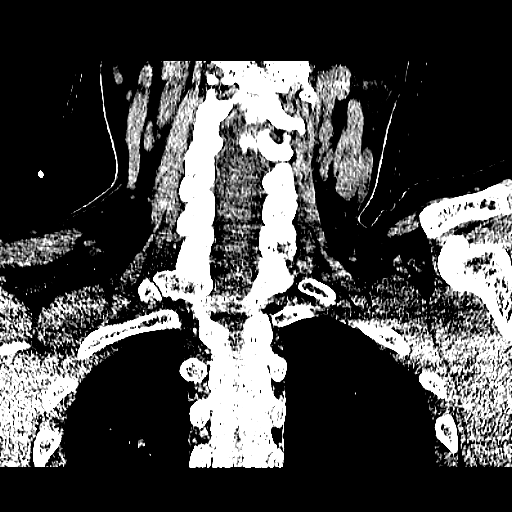
[im 28/50  brain]
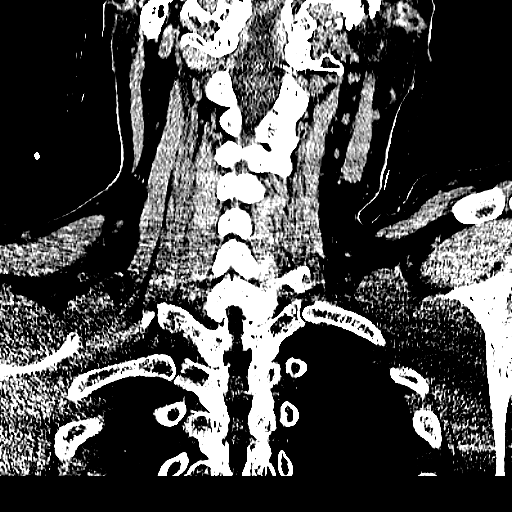

[Series 608: orthogonal cspine · axial · 0.51mm/px · z∈[-379,-328]mm · 3 of 122 slices shown]
[im 14/122  brain]
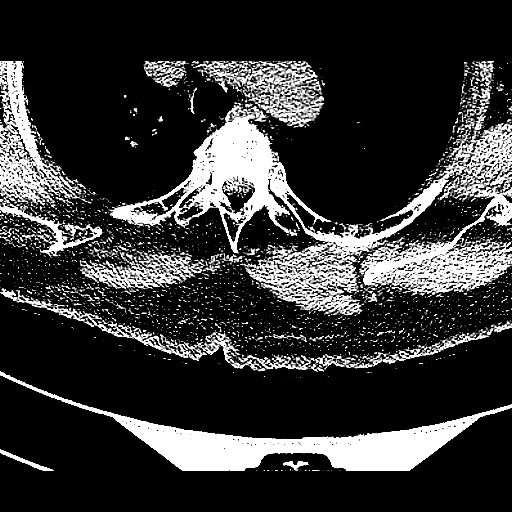
[im 27/122  brain]
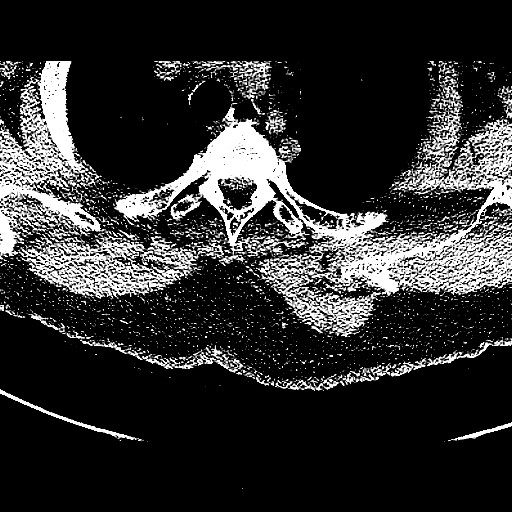
[im 41/122  brain]
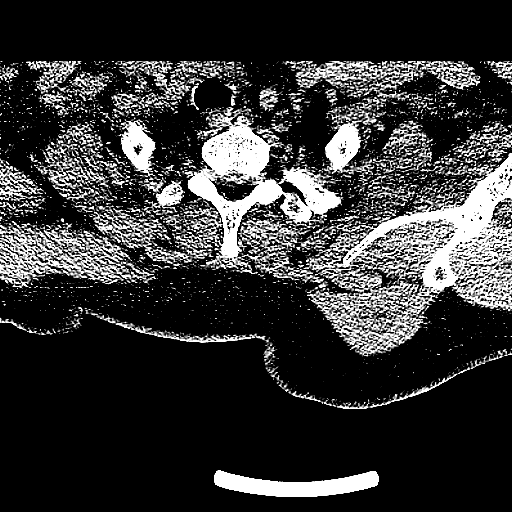

[15 of 47 positions shown; findings below may reference images not displayed]

FINDINGS: CT HEAD FINDINGS

Normal ventricular morphology.

No midline shift or mass effect.

Normal appearance of brain parenchyma.

No intracranial hemorrhage, mass lesion or evidence acute
infarction.

Air present at the cavernous sinuses bilaterally, in the sella
turcica, and suprasellar compatible with basilar skull fracture.

Partially opacified mastoid air cells bilaterally.

Air-fluid levels in RIGHT maxillary and sphenoid sinuses with
partial ethmoid opacification bilaterally.

No calvarial fracture identified.

CT MAXILLOFACIAL FINDINGS

Air present within sella turcica, cavernous sinuses and suprasellar
region.

Air-fluid level/hemorrhage within RIGHT sphenoid sinus and RIGHT
maxillary sinus.

Partial opacification of ethmoid air cells bilaterally.

Small mucosal retention cyst LEFT maxillary sinus.

Frontal sinus and LEFT maxillary sinus otherwise clear.

Partial opacification of mastoid air cells bilaterally.

Alignment of the LEFT ossicles grossly normal.

Disruption of RIGHT ossicular chain with displacement of the
malleus.

RIGHT temporal bone fracture extends to the RIGHT sphenoid across
the lateral and anterior walls of the RIGHT sphenoid sinus.

No definite fractures of the orbits, maxillary sinuses or zygomas.

Soft tissue gas identified anterior and inferior to the mastoid air
cells bilaterally and tracking inferiorly into the parapharyngeal
spaces.

Gas is seen near the courses of the internal carotid arteries
bilaterally at the carotid siphons but no discrete fracture planes
involving the siphons are visualized.

Intraorbital soft tissue planes clear.

CT CERVICAL SPINE FINDINGS

Partial BILATERAL mastoid air cell opacification.

Prevertebral soft tissues normal thickness.

Minimal scattered disc space narrowing and endplate spur formation.

Vertebral body heights maintained.

No acute cervical spine fracture, subluxation, or bone destruction.

Lung apices clear.
IMPRESSION: No acute intracranial abnormalities.

Degenerative disc disease changes of the cervical spine.

No acute cervical spine abnormalities.

BILATERAL temporal bone fractures with partial opacification of the
mastoid air cells bilaterally.

Disruption of the RIGHT ossicular chain.

Extension of RIGHT temporal fracture into the RIGHT sphenoid bone
and traversing the RIGHT sphenoid sinus.

Though no definite involvement of the carotid canals is seen
bilaterally, fracture planes particularly on the RIGHT are in close
proximity to the carotid canals and if there is clinical concern for
internal carotid injury or presence of neurologic impairment then
consider CTA head to assess the internal carotid arteries at the
skullbase.

Associated soft tissue gas caudal to the skullbase as well as
intracranial gas at the cavernous sinuses, sella turcica and
suprasellar regions.

Findings called to Dr. Ashely on 12/06/2013 at 3297 hr.

## 2016-08-03 IMAGING — CT CT ANGIO NECK
1 of 11 series · 1 of 33 positions shown · IV contrast (Iohexol (Omnipaque 350))
Comparison: CT the head and cervical spine earlier today

CLINICAL DATA: Hit by forklift. Skull fractures. Rule out arterial
injury.

EXAM:
CT ANGIOGRAPHY HEAD AND NECK
TECHNIQUE: Multidetector CT imaging of the head and neck was performed using
the standard protocol during bolus administration of intravenous
contrast. Multiplanar CT image reconstructions and MIPs were
obtained to evaluate the vascular anatomy. Carotid stenosis
measurements (when applicable) are obtained utilizing NASCET
criteria, using the distal internal carotid diameter as the
denominator.
CONTRAST:  50mL OMNIPAQUE IOHEXOL 350 MG/ML SOLN

[Series 200: locator · axial · 0.49mm/px · 1 of 1 slices shown]
[im 1/1  soft-tissue]
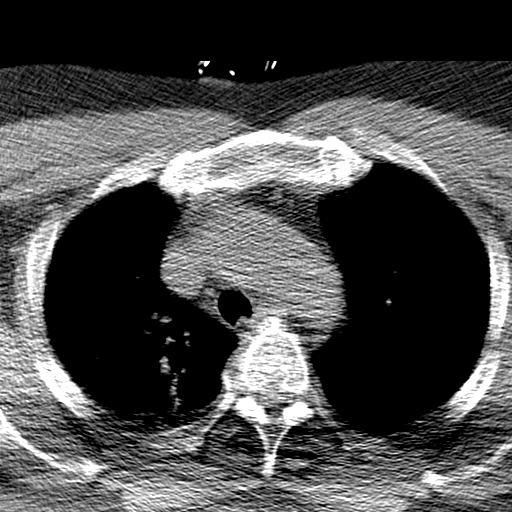

[1 of 33 positions shown; findings below may reference images not displayed]

FINDINGS: CTA HEAD FINDINGS

Ventricle size is normal. No intracranial hemorrhage. There is
pneumocephalus with gas in the right cavernous sinus and between the
clivus and basilar and suprasellar cistern. This is compatible with
basilar skull fractures. Bilateral mastoid sinus fractures are
noted. There is gas throughout the deep soft tissues of the neck
presumably related to the skullbase fractures. Air-fluid levels in
the right maxillary and sphenoid sinuses.

Both vertebral arteries are normal. The basilar is normal.
Cerebellar and posterior cerebral arteries are normal.

Cavernous carotid is normal bilaterally. No evidence of carotid
dissection or injury. Petrous segment is normal. Anterior and middle
cerebral arteries are normal. Negative for aneurysm.

Review of the MIP images confirms the above findings.

CTA NECK FINDINGS

Proximal great vessels are normal. No mass or adenopathy in the
neck.

Carotid artery is normal bilaterally. Negative for carotid injury.
No atherosclerotic disease or dissection.

Both vertebral arteries are equal in size and widely patent. No
significant atherosclerotic disease or dissection.

Review of the MIP images confirms the above findings.
IMPRESSION: Negative for arterial injury of the carotid or basilar artery
bilaterally.

Skullbase fractures with pneumocephalus.
